# Patient Record
Sex: Male | Born: 1955
Health system: Southern US, Community
[De-identification: ages and names within clinical notes are randomized; demographics above are authoritative.]

## PROBLEM LIST (undated history)

## (undated) DIAGNOSIS — D649 Anemia, unspecified: Secondary | ICD-10-CM

## (undated) DIAGNOSIS — N183 Chronic kidney disease, stage 3 unspecified: Secondary | ICD-10-CM

## (undated) DIAGNOSIS — Z8701 Personal history of pneumonia (recurrent): Secondary | ICD-10-CM

## (undated) DIAGNOSIS — E785 Hyperlipidemia, unspecified: Secondary | ICD-10-CM

## (undated) DIAGNOSIS — I10 Essential (primary) hypertension: Secondary | ICD-10-CM

## (undated) DIAGNOSIS — M869 Osteomyelitis, unspecified: Secondary | ICD-10-CM

## (undated) DIAGNOSIS — E119 Type 2 diabetes mellitus without complications: Secondary | ICD-10-CM

## (undated) DIAGNOSIS — I5030 Unspecified diastolic (congestive) heart failure: Secondary | ICD-10-CM

## (undated) DIAGNOSIS — F419 Anxiety disorder, unspecified: Secondary | ICD-10-CM

---

## 2015-12-14 ENCOUNTER — Emergency Department (INDEPENDENT_AMBULATORY_CARE_PROVIDER_SITE_OTHER)
Admission: EM | Admit: 2015-12-14 | Discharge: 2015-12-14 | Disposition: A | Discharge status: Home / Self Care | Payer: BLUE CROSS/BLUE SHIELD | Source: Home / Self Care | Attending: Emergency Medicine | Admitting: Emergency Medicine

## 2015-12-14 ENCOUNTER — Emergency Department (INDEPENDENT_AMBULATORY_CARE_PROVIDER_SITE_OTHER): Payer: BLUE CROSS/BLUE SHIELD

## 2015-12-14 ENCOUNTER — Encounter (HOSPITAL_COMMUNITY): Payer: Self-pay | Admitting: Emergency Medicine

## 2015-12-14 DIAGNOSIS — J189 Pneumonia, unspecified organism: Secondary | ICD-10-CM | POA: Diagnosis not present

## 2015-12-14 MED ORDER — LIDOCAINE HCL (PF) 1 % IJ SOLN
INTRAMUSCULAR | Status: AC
  Filled 2015-12-14: qty 5

## 2015-12-14 MED ORDER — CEFTRIAXONE SODIUM 1 G IJ SOLR
1.0000 g | Freq: Once | INTRAMUSCULAR | Status: AC
  Administered 2015-12-14: 1 g via INTRAMUSCULAR

## 2015-12-14 MED ORDER — LEVOFLOXACIN 750 MG PO TABS: 750.0000 mg | ORAL_TABLET | Freq: Every day | ORAL | Status: DC

## 2015-12-14 MED ORDER — CEFTRIAXONE SODIUM 1 G IJ SOLR
INTRAMUSCULAR | Status: AC
  Filled 2015-12-14: qty 10

## 2015-12-14 NOTE — ED Notes (Signed)
C/o cold sx onset x3 days associated w/wheezing, chest congestion and feeling tired Denies CP, fevers Taking OTC cold meds w/no relief.

## 2015-12-14 NOTE — ED Provider Notes (Signed)
CSN: [id number]     Arrival date & time 12/14/15  1321 History   First MD Initiated Contact with Patient 12/14/15 1447     Chief Complaint  Patient presents with  . URI   (Consider location/radiation/quality/duration/timing/severity/associated sxs/prior Treatment) HPI Patient presents with chief complaint of shortness of breath. He states that he was at a company picnic's Friday and carrying boxes of potato chips when he suddenly became short of breath. He states that it is not related to chest pain but does get worse with exertion. He also states that when he tried to sleep Friday night each time he would lie down he would get short of breath and would have to sit up immediately felt somewhat better Saturday so did not feel he needed to be seen emergently but last night his symptoms came back. Patient is a nonsmoker states that he does have a bit of a cough of clear sputum. History of cardiac disease. 7 history of hypertension that he has taken hydrochlorothiazide for any is a type II diabetic which he takes Amaryl. History reviewed. No pertinent past medical history. History reviewed. No pertinent past surgical history. No family history on file. Social History  Substance Use Topics  . Smoking status: Never Smoker   . Smokeless tobacco: None  . Alcohol Use: No    Review of Systems  Allergies  Review of patient's allergies indicates no known allergies.  Home Medications   Prior to Admission medications   Medication Sig Start Date End Date Taking? Authorizing Provider  benazepril (LOTENSIN) 20 MG tablet Take 20 mg by mouth daily.   Yes Historical Provider, MD  citalopram (CELEXA) 20 MG tablet Take 20 mg by mouth daily.   Yes Historical Provider, MD  glimepiride (AMARYL) 4 MG tablet Take 4 mg by mouth daily with breakfast.   Yes Historical Provider, MD  hydrochlorothiazide (HYDRODIURIL) 25 MG tablet Take 25 mg by mouth daily.   Yes Historical Provider, MD  Linagliptin-Metformin HCl  (JENTADUETO PO) Take by mouth.   Yes Historical Provider, MD   Meds Ordered and Administered this Visit   Medications  cefTRIAXone (ROCEPHIN) injection 1 g (1 g Intramuscular Given 12/14/15 1611)    BP 124/86 mmHg  Pulse 113  Temp(Src) 98.6 F (37 C) (Oral)  SpO2 87% No data found.   Physical Exam  Constitutional: He is oriented to person, place, and time. He appears well-developed and well-nourished.  HENT:  Head: Normocephalic and atraumatic.  Right Ear: External ear normal.  Left Ear: External ear normal.  Mouth/Throat: Oropharynx is clear and moist.  Neck: Normal range of motion. Neck supple.  Cardiovascular: Normal rate, regular rhythm and normal heart sounds.   Pulmonary/Chest: Effort normal. No respiratory distress. He has no wheezes. He exhibits no tenderness.  Oxygen saturations have ranged from 79% to 87%. Patient is not dyspneic on examination. Breath sounds are decreased bilaterally with crackles on the left.  Abdominal: Soft. Bowel sounds are normal.  Musculoskeletal: Normal range of motion.  Neurological: He is alert and oriented to person, place, and time.  Skin: Skin is warm and dry.  Psychiatric: He has a normal mood and affect. His behavior is normal. Judgment and thought content normal.  Nursing note and vitals reviewed.  ROS +'veSHORTNESS OF BREATH   Denies: HEADACHE, NAUSEA, ABDOMINAL PAIN, CHEST PAIN, CONGESTION, DYSURIA ED Course  Procedures (including critical care time)  Labs Review Labs Reviewed - No data to display  Imaging Review No results found.   Visual  Acuity Review  Right Eye Distance:   Left Eye Distance:   Bilateral Distance:    Right Eye Near:   Left Eye Near:    Bilateral Near:       Review of chest x-ray with patient and his wife positive for bilateral infiltrates left greater than right. Patient is advised that he will be receiving 1 g of IV ceftriaxone and a prescription for levofloxacin 750 mg. He is also advised  that if his symptoms worsen over the course of the night he should go to [facility]. He is also advised that if he is feeling better or as stable as he is now to give a call to the urgent care center tomorrow to speak to me. A return to work note is provided for this patient. Patient's saturation is somewhat concerning but he feels well. There is a reasonable explanation for the saturations being low. Discussion with patient about going to Hospital patient states that he would prefer to try outpatient treatment first and he will elect to go to the emergency department if he needs to be  MDM   1. Community acquired pneumonia    Patient is advised to continue home symptomatic treatment. Prescription for monotherapy with Levaquin  sent pharmacy patient has indicated. Patient is advised that if there are new or worsening symptoms or attend the emergency department, or contact primary care provider. Instructions of care provided discharged home in stable condition.  THIS NOTE WAS GENERATED USING A VOICE RECOGNITION SOFTWARE PROGRAM. ALL REASONABLE EFFORTS  WERE MADE TO PROOFREAD THIS DOCUMENT FOR ACCURACY.       Tharon Aquas, PA 12/14/15 [phone]

## 2015-12-19 ENCOUNTER — Emergency Department (HOSPITAL_COMMUNITY): Payer: BLUE CROSS/BLUE SHIELD

## 2015-12-19 ENCOUNTER — Inpatient Hospital Stay (HOSPITAL_COMMUNITY)
Admission: EM | Admit: 2015-12-19 | Discharge: 2015-12-26 | DRG: 981 | Disposition: A | Discharge status: Home Health | Payer: BLUE CROSS/BLUE SHIELD | Attending: Internal Medicine | Admitting: Internal Medicine

## 2015-12-19 ENCOUNTER — Encounter (HOSPITAL_COMMUNITY): Payer: Self-pay | Admitting: Emergency Medicine

## 2015-12-19 DIAGNOSIS — Z7982 Long term (current) use of aspirin: Secondary | ICD-10-CM | POA: Diagnosis not present

## 2015-12-19 DIAGNOSIS — E119 Type 2 diabetes mellitus without complications: Secondary | ICD-10-CM

## 2015-12-19 DIAGNOSIS — I10 Essential (primary) hypertension: Secondary | ICD-10-CM | POA: Diagnosis not present

## 2015-12-19 DIAGNOSIS — I5033 Acute on chronic diastolic (congestive) heart failure: Secondary | ICD-10-CM | POA: Diagnosis present

## 2015-12-19 DIAGNOSIS — Z833 Family history of diabetes mellitus: Secondary | ICD-10-CM

## 2015-12-19 DIAGNOSIS — J9801 Acute bronchospasm: Secondary | ICD-10-CM | POA: Diagnosis present

## 2015-12-19 DIAGNOSIS — J189 Pneumonia, unspecified organism: Secondary | ICD-10-CM

## 2015-12-19 DIAGNOSIS — Z7984 Long term (current) use of oral hypoglycemic drugs: Secondary | ICD-10-CM | POA: Diagnosis not present

## 2015-12-19 DIAGNOSIS — J9601 Acute respiratory failure with hypoxia: Secondary | ICD-10-CM | POA: Diagnosis present

## 2015-12-19 DIAGNOSIS — N179 Acute kidney failure, unspecified: Secondary | ICD-10-CM | POA: Diagnosis present

## 2015-12-19 DIAGNOSIS — Z8249 Family history of ischemic heart disease and other diseases of the circulatory system: Secondary | ICD-10-CM | POA: Diagnosis not present

## 2015-12-19 DIAGNOSIS — I5031 Acute diastolic (congestive) heart failure: Secondary | ICD-10-CM | POA: Diagnosis present

## 2015-12-19 DIAGNOSIS — M869 Osteomyelitis, unspecified: Secondary | ICD-10-CM | POA: Diagnosis present

## 2015-12-19 DIAGNOSIS — I110 Hypertensive heart disease with heart failure: Secondary | ICD-10-CM | POA: Diagnosis present

## 2015-12-19 DIAGNOSIS — F22 Delusional disorders: Secondary | ICD-10-CM

## 2015-12-19 DIAGNOSIS — L97519 Non-pressure chronic ulcer of other part of right foot with unspecified severity: Secondary | ICD-10-CM | POA: Diagnosis present

## 2015-12-19 DIAGNOSIS — E1169 Type 2 diabetes mellitus with other specified complication: Secondary | ICD-10-CM | POA: Diagnosis present

## 2015-12-19 DIAGNOSIS — Z23 Encounter for immunization: Secondary | ICD-10-CM

## 2015-12-19 DIAGNOSIS — R0600 Dyspnea, unspecified: Secondary | ICD-10-CM | POA: Diagnosis not present

## 2015-12-19 DIAGNOSIS — E1142 Type 2 diabetes mellitus with diabetic polyneuropathy: Secondary | ICD-10-CM | POA: Diagnosis present

## 2015-12-19 DIAGNOSIS — D649 Anemia, unspecified: Secondary | ICD-10-CM | POA: Diagnosis present

## 2015-12-19 DIAGNOSIS — Z9889 Other specified postprocedural states: Secondary | ICD-10-CM

## 2015-12-19 DIAGNOSIS — E11621 Type 2 diabetes mellitus with foot ulcer: Secondary | ICD-10-CM | POA: Diagnosis present

## 2015-12-19 DIAGNOSIS — R0602 Shortness of breath: Secondary | ICD-10-CM | POA: Diagnosis present

## 2015-12-19 HISTORY — DX: Osteomyelitis, unspecified: M86.9

## 2015-12-19 LAB — BLOOD GAS, ARTERIAL
Acid-Base Excess: 4.4 mmol/L — ABNORMAL HIGH (ref 0.0–2.0)
Bicarbonate: 21 mEq/L (ref 20.0–24.0)
Delivery systems: POSITIVE
Drawn by: 221791
Expiratory PAP: 8
FIO2: 100
Inspiratory PAP: 16
O2 Saturation: 92.1 %
Patient temperature: 37
RATE: 10 resp/min
TCO2: 12.4 mmol/L (ref 0–100)
pCO2 arterial: 31.7 mmHg — ABNORMAL LOW (ref 35.0–45.0)
pH, Arterial: 7.406 (ref 7.350–7.450)
pO2, Arterial: 70.8 mmHg — ABNORMAL LOW (ref 80.0–100.0)

## 2015-12-19 LAB — BASIC METABOLIC PANEL
Anion gap: 10 (ref 5–15)
BUN: 51 mg/dL — ABNORMAL HIGH (ref 6–20)
CO2: 26 mmol/L (ref 22–32)
Calcium: 8.7 mg/dL — ABNORMAL LOW (ref 8.9–10.3)
Chloride: 104 mmol/L (ref 101–111)
Creatinine, Ser: 2.04 mg/dL — ABNORMAL HIGH (ref 0.61–1.24)
GFR calc Af Amer: 40 mL/min — ABNORMAL LOW (ref >60)
GFR calc non Af Amer: 34 mL/min — ABNORMAL LOW (ref >60)
Glucose, Bld: 247 mg/dL — ABNORMAL HIGH (ref 65–99)
Potassium: 4.5 mmol/L (ref 3.5–5.1)
Sodium: 140 mmol/L (ref 135–145)

## 2015-12-19 LAB — CBC WITH DIFFERENTIAL/PLATELET
Basophils Absolute: 0 10*3/uL (ref 0.0–0.1)
Basophils Relative: 0 %
Eosinophils Absolute: 0.2 10*3/uL (ref 0.0–0.7)
Eosinophils Relative: 1 %
HCT: 32.7 % — ABNORMAL LOW (ref 39.0–52.0)
Hemoglobin: 10.5 g/dL — ABNORMAL LOW (ref 13.0–17.0)
Lymphocytes Relative: 12 %
Lymphs Abs: 1.8 10*3/uL (ref 0.7–4.0)
MCH: 28.5 pg (ref 26.0–34.0)
MCHC: 32.1 g/dL (ref 30.0–36.0)
MCV: 88.6 fL (ref 78.0–100.0)
Monocytes Absolute: 1.1 10*3/uL — ABNORMAL HIGH (ref 0.1–1.0)
Monocytes Relative: 7 %
Neutro Abs: 12.9 10*3/uL — ABNORMAL HIGH (ref 1.7–7.7)
Neutrophils Relative %: 80 %
Platelets: 427 10*3/uL — ABNORMAL HIGH (ref 150–400)
RBC: 3.69 MIL/uL — ABNORMAL LOW (ref 4.22–5.81)
RDW: 15.4 % (ref 11.5–15.5)
WBC: 16 10*3/uL — ABNORMAL HIGH (ref 4.0–10.5)

## 2015-12-19 LAB — INFLUENZA PANEL BY PCR (TYPE A & B)
H1N1 flu by pcr: NOT DETECTED
Influenza A By PCR: NEGATIVE
Influenza B By PCR: NEGATIVE

## 2015-12-19 LAB — GLUCOSE, CAPILLARY: Glucose-Capillary: 359 mg/dL — ABNORMAL HIGH (ref 65–99)

## 2015-12-19 LAB — LACTIC ACID, PLASMA
Lactic Acid, Venous: 1.9 mmol/L (ref 0.5–2.0)
Lactic Acid, Venous: 2.1 mmol/L (ref 0.5–2.0)

## 2015-12-19 LAB — BRAIN NATRIURETIC PEPTIDE: B Natriuretic Peptide: 4067 pg/mL — ABNORMAL HIGH (ref 0.0–100.0)

## 2015-12-19 MED ORDER — LINAGLIPTIN 5 MG PO TABS: 5.0000 mg | ORAL_TABLET | Freq: Every day | ORAL | Status: DC

## 2015-12-19 MED ORDER — DEXTROSE 5 % IV SOLN
500.0000 mg | INTRAVENOUS | Status: DC
  Administered 2015-12-20 – 2015-12-24 (×5): 500 mg via INTRAVENOUS
  Filled 2015-12-19 (×5): qty 500

## 2015-12-19 MED ORDER — AMLODIPINE BESYLATE 5 MG PO TABS
5.0000 mg | ORAL_TABLET | Freq: Two times a day (BID) | ORAL | Status: DC
  Administered 2015-12-19 – 2015-12-20 (×2): 5 mg via ORAL
  Filled 2015-12-19 (×2): qty 1

## 2015-12-19 MED ORDER — INSULIN ASPART 100 UNIT/ML ~~LOC~~ SOLN
0.0000 [IU] | Freq: Every day | SUBCUTANEOUS | Status: DC
  Administered 2015-12-19 – 2015-12-21 (×2): 5 [IU] via SUBCUTANEOUS
  Administered 2015-12-22: 3 [IU] via SUBCUTANEOUS
  Administered 2015-12-24: 2 [IU] via SUBCUTANEOUS

## 2015-12-19 MED ORDER — DEXTROSE 5 % IV SOLN
500.0000 mg | Freq: Once | INTRAVENOUS | Status: AC
  Administered 2015-12-19: 500 mg via INTRAVENOUS
  Filled 2015-12-19: qty 500

## 2015-12-19 MED ORDER — DEXTROSE 5 % IV SOLN
1.0000 g | INTRAVENOUS | Status: DC
  Filled 2015-12-19 (×2): qty 10

## 2015-12-19 MED ORDER — METHYLPREDNISOLONE SODIUM SUCC 125 MG IJ SOLR
125.0000 mg | Freq: Once | INTRAMUSCULAR | Status: AC
  Administered 2015-12-19: 125 mg via INTRAVENOUS
  Filled 2015-12-19: qty 2

## 2015-12-19 MED ORDER — ASPIRIN EC 81 MG PO TBEC
81.0000 mg | DELAYED_RELEASE_TABLET | Freq: Every day | ORAL | Status: DC
  Administered 2015-12-20 – 2015-12-26 (×6): 81 mg via ORAL
  Filled 2015-12-19 (×6): qty 1

## 2015-12-19 MED ORDER — CITALOPRAM HYDROBROMIDE 20 MG PO TABS
20.0000 mg | ORAL_TABLET | Freq: Every day | ORAL | Status: DC
  Administered 2015-12-20 – 2015-12-26 (×6): 20 mg via ORAL
  Filled 2015-12-19 (×6): qty 1

## 2015-12-19 MED ORDER — ASPIRIN EC 81 MG PO TBEC: 81.0000 mg | DELAYED_RELEASE_TABLET | Freq: Every day | ORAL | Status: DC

## 2015-12-19 MED ORDER — INFLUENZA VAC SPLIT QUAD 0.5 ML IM SUSY
0.5000 mL | PREFILLED_SYRINGE | INTRAMUSCULAR | Status: AC
  Administered 2015-12-20: 0.5 mL via INTRAMUSCULAR
  Filled 2015-12-19: qty 0.5

## 2015-12-19 MED ORDER — AMLODIPINE BESYLATE 5 MG PO TABS: 10.0000 mg | ORAL_TABLET | Freq: Every day | ORAL | Status: DC

## 2015-12-19 MED ORDER — VANCOMYCIN HCL 10 G IV SOLR
1750.0000 mg | Freq: Once | INTRAVENOUS | Status: DC
  Filled 2015-12-19: qty 1750

## 2015-12-19 MED ORDER — PNEUMOCOCCAL VAC POLYVALENT 25 MCG/0.5ML IJ INJ
0.5000 mL | INJECTION | INTRAMUSCULAR | Status: AC
  Administered 2015-12-20: 0.5 mL via INTRAMUSCULAR
  Filled 2015-12-19: qty 0.5

## 2015-12-19 MED ORDER — DEXTROSE 5 % IV SOLN
500.0000 mg | INTRAVENOUS | Status: DC
  Filled 2015-12-19 (×2): qty 500

## 2015-12-19 MED ORDER — ALBUTEROL (5 MG/ML) CONTINUOUS INHALATION SOLN
10.0000 mg/h | INHALATION_SOLUTION | Freq: Once | RESPIRATORY_TRACT | Status: AC
  Administered 2015-12-19: 10 mg/h via RESPIRATORY_TRACT
  Filled 2015-12-19: qty 20

## 2015-12-19 MED ORDER — LINAGLIPTIN 5 MG PO TABS
5.0000 mg | ORAL_TABLET | Freq: Every day | ORAL | Status: DC
  Administered 2015-12-20: 5 mg via ORAL
  Filled 2015-12-19: qty 1

## 2015-12-19 MED ORDER — IPRATROPIUM BROMIDE 0.02 % IN SOLN
1.0000 mg | Freq: Once | RESPIRATORY_TRACT | Status: AC
  Administered 2015-12-19: 1 mg via RESPIRATORY_TRACT
  Filled 2015-12-19: qty 5

## 2015-12-19 MED ORDER — DEXTROSE 5 % IV SOLN
1.0000 g | Freq: Once | INTRAVENOUS | Status: AC
  Administered 2015-12-19: 1 g via INTRAVENOUS
  Filled 2015-12-19: qty 10

## 2015-12-19 MED ORDER — VANCOMYCIN HCL IN DEXTROSE 750-5 MG/150ML-% IV SOLN
750.0000 mg | Freq: Two times a day (BID) | INTRAVENOUS | Status: DC
  Filled 2015-12-19 (×5): qty 150

## 2015-12-19 MED ORDER — METHYLPREDNISOLONE SODIUM SUCC 125 MG IJ SOLR
60.0000 mg | Freq: Two times a day (BID) | INTRAMUSCULAR | Status: DC
  Administered 2015-12-20 – 2015-12-21 (×3): 60 mg via INTRAVENOUS
  Filled 2015-12-19 (×3): qty 2

## 2015-12-19 MED ORDER — SODIUM CHLORIDE 0.9 % IV BOLUS (SEPSIS)
500.0000 mL | Freq: Once | INTRAVENOUS | Status: AC
Start: 2015-12-19 — End: 2015-12-19
  Administered 2015-12-19: 500 mL via INTRAVENOUS

## 2015-12-19 MED ORDER — GLIMEPIRIDE 4 MG PO TABS
4.0000 mg | ORAL_TABLET | Freq: Every day | ORAL | Status: DC
  Administered 2015-12-20: 4 mg via ORAL
  Filled 2015-12-19: qty 1
  Filled 2015-12-19: qty 2
  Filled 2015-12-19 (×2): qty 1

## 2015-12-19 MED ORDER — ENOXAPARIN SODIUM 40 MG/0.4ML ~~LOC~~ SOLN
40.0000 mg | SUBCUTANEOUS | Status: DC
  Administered 2015-12-19 – 2015-12-25 (×6): 40 mg via SUBCUTANEOUS
  Filled 2015-12-19 (×6): qty 0.4

## 2015-12-19 MED ORDER — BENAZEPRIL HCL 10 MG PO TABS
20.0000 mg | ORAL_TABLET | Freq: Two times a day (BID) | ORAL | Status: DC
  Administered 2015-12-19: 20 mg via ORAL
  Filled 2015-12-19: qty 2

## 2015-12-19 MED ORDER — HYDROCHLOROTHIAZIDE 25 MG PO TABS: 25.0000 mg | ORAL_TABLET | Freq: Every day | ORAL | Status: DC

## 2015-12-19 MED ORDER — DEXTROSE 5 % IV SOLN
1.0000 g | Freq: Once | INTRAVENOUS | Status: DC
  Administered 2015-12-19: 1 g via INTRAVENOUS
  Filled 2015-12-19: qty 1

## 2015-12-19 MED ORDER — INSULIN ASPART 100 UNIT/ML ~~LOC~~ SOLN
0.0000 [IU] | Freq: Three times a day (TID) | SUBCUTANEOUS | Status: DC
  Administered 2015-12-20: 15 [IU] via SUBCUTANEOUS
  Administered 2015-12-20: 20 [IU] via SUBCUTANEOUS
  Administered 2015-12-20: 3 [IU] via SUBCUTANEOUS
  Administered 2015-12-21 (×2): 7 [IU] via SUBCUTANEOUS
  Administered 2015-12-21: 3 [IU] via SUBCUTANEOUS
  Administered 2015-12-22: 4 [IU] via SUBCUTANEOUS
  Administered 2015-12-22: 3 [IU] via SUBCUTANEOUS
  Administered 2015-12-22 – 2015-12-23 (×2): 7 [IU] via SUBCUTANEOUS
  Administered 2015-12-23 – 2015-12-24 (×3): 3 [IU] via SUBCUTANEOUS
  Administered 2015-12-25: 11 [IU] via SUBCUTANEOUS
  Administered 2015-12-25 (×2): 3 [IU] via SUBCUTANEOUS
  Administered 2015-12-26: 11 [IU] via SUBCUTANEOUS
  Administered 2015-12-26: 3 [IU] via SUBCUTANEOUS
  Administered 2015-12-26: 4 [IU] via SUBCUTANEOUS

## 2015-12-19 MED ORDER — SODIUM CHLORIDE 0.9 % IV SOLN
INTRAVENOUS | Status: DC
  Administered 2015-12-19: 100 mL/h via INTRAVENOUS

## 2015-12-19 MED ORDER — METFORMIN HCL 500 MG PO TABS
1000.0000 mg | ORAL_TABLET | Freq: Two times a day (BID) | ORAL | Status: DC
  Administered 2015-12-20: 1000 mg via ORAL
  Filled 2015-12-19: qty 2

## 2015-12-19 MED ORDER — DEXTROSE 5 % IV SOLN
1.0000 g | INTRAVENOUS | Status: AC
  Administered 2015-12-20 – 2015-12-25 (×6): 1 g via INTRAVENOUS
  Filled 2015-12-19 (×6): qty 10

## 2015-12-19 NOTE — ED Provider Notes (Signed)
Called into exam room by ED RN: Pt in process of being transported to his inpt room when he became increasingly SOB and diaphoretic. Sats 70% R/A. Pt taking very small shallow breaths. NRB applied. Pt awake/alert, tachypneic, lungs coarse with wheezes bilat, no audible wheezing, HR tachycardic, abd soft/NT, +2 pedal edema bilat. RT called to start bipap and hour long neb. BNP and influenza panel ordered. [facility] Dr. Adrian Blackwater at bedside.    Samuel Jester, DO 12/19/15 1710

## 2015-12-19 NOTE — ED Notes (Signed)
Pt is currently on 4L of oxygen via [place].  States that he is feeling better after oxygen administration.

## 2015-12-19 NOTE — ED Notes (Signed)
Respiratory paged for bipap and Dr. Adrian Blackwater at bedside to evaluate.

## 2015-12-19 NOTE — ED Notes (Signed)
Pt placed on bipap by RT.  Dr Deretha Emory at bedside with myself.

## 2015-12-19 NOTE — ED Provider Notes (Addendum)
CSN: [id number]     Arrival date & time 12/19/15  1159 History   First MD Initiated Contact with Patient 12/19/15 1307     Chief Complaint  Patient presents with  . Fatigue     (Consider location/radiation/quality/duration/timing/severity/associated sxs/prior Treatment) The history is provided by the patient and the spouse.   60 year old male seen in [facility] urgent care 1 week ago given a shot of Rocephin and started on Levaquin for community-acquired pneumonia based on chest x-ray. Patient has not really improved his eczema gotten worse. Patient feeling very short of breath.  Past Medical History  Diagnosis Date  . Diabetes mellitus without complication (HCC)    History reviewed. No pertinent past surgical history. History reviewed. No pertinent family history. Social History  Substance Use Topics  . Smoking status: Never Smoker   . Smokeless tobacco: None  . Alcohol Use: No    Review of Systems  Constitutional: Positive for fatigue.  HENT: Positive for congestion.   Eyes: Negative for visual disturbance.  Respiratory: Positive for cough and shortness of breath.   Cardiovascular: Negative for chest pain.  Gastrointestinal: Negative for nausea, vomiting and abdominal pain.  Musculoskeletal: Negative for back pain.  Neurological: Negative for headaches.  Hematological: Does not bruise/bleed easily.  Psychiatric/Behavioral: Negative for confusion.      Allergies  Review of patient's allergies indicates no known allergies.  Home Medications   Prior to Admission medications   Medication Sig Start Date End Date Taking? Authorizing Provider  amLODipine-benazepril (LOTREL) 5-20 MG capsule Take 1 capsule by mouth 2 (two) times daily.  11/21/15  Yes Historical Provider, MD  aspirin EC 81 MG tablet Take 81 mg by mouth daily.   Yes Historical Provider, MD  citalopram (CELEXA) 20 MG tablet Take 20 mg by mouth daily.   Yes Historical Provider, MD  Cyanocobalamin (B-12 PO) Take 1  tablet by mouth daily.   Yes Historical Provider, MD  glimepiride (AMARYL) 4 MG tablet Take 4 mg by mouth daily with breakfast.   Yes Historical Provider, MD  hydrochlorothiazide (HYDRODIURIL) 25 MG tablet Take 25 mg by mouth daily.   Yes Historical Provider, MD  levofloxacin (LEVAQUIN) 750 MG tablet Take 1 tablet (750 mg total) by mouth daily. 12/14/15  Yes Tharon Aquas, PA  Linagliptin-Metformin HCl (JENTADUETO) 2.03-999 MG TABS Take 1 tablet by mouth 2 (two) times daily.   Yes Historical Provider, MD  Multiple Vitamin (MULTIVITAMIN WITH MINERALS) TABS tablet Take 1 tablet by mouth daily.   Yes Historical Provider, MD   BP 134/93 mmHg  Pulse 100  Temp(Src) 97.5 F (36.4 C) (Oral)  Resp 29  Ht 5\' 10"  (1.778 m)  Wt 81.647 kg  BMI 25.83 kg/m2  SpO2 90% Physical Exam  Constitutional: He is oriented to person, place, and time. He appears well-developed and well-nourished.  HENT:  Head: Normocephalic and atraumatic.  Mouth/Throat: Oropharynx is clear and moist.  Eyes: Conjunctivae and EOM are normal. Pupils are equal, round, and reactive to light.  Neck: Normal range of motion. Neck supple.  Cardiovascular: Normal rate and normal heart sounds.   Pulmonary/Chest: No respiratory distress. He has rales.  Abdominal: Soft. Bowel sounds are normal. There is no tenderness.  Musculoskeletal: Normal range of motion. He exhibits edema.  Neurological: He is alert and oriented to person, place, and time. No cranial nerve deficit. He exhibits normal muscle tone. Coordination normal.  Skin: Skin is warm. No erythema.  Nursing note and vitals reviewed.   ED Course  Procedures (including critical care time) Labs Review Labs Reviewed  CBC WITH DIFFERENTIAL/PLATELET - Abnormal; Notable for the following:    WBC 16.0 (*)    RBC 3.69 (*)    Hemoglobin 10.5 (*)    HCT 32.7 (*)    Platelets 427 (*)    Neutro Abs 12.9 (*)    Monocytes Absolute 1.1 (*)    All other components within normal limits   BASIC METABOLIC PANEL - Abnormal; Notable for the following:    Glucose, Bld 247 (*)    BUN 51 (*)    Creatinine, Ser 2.04 (*)    Calcium 8.7 (*)    GFR calc non Af Amer 34 (*)    GFR calc Af Amer 40 (*)    All other components within normal limits  CULTURE, BLOOD (ROUTINE X 2)  CULTURE, BLOOD (ROUTINE X 2)  LACTIC ACID, PLASMA  LACTIC ACID, PLASMA   Results for orders placed or performed during the hospital encounter of 12/19/15  Culture, blood (routine x 2)  Result Value Ref Range   Specimen Description BLOOD RIGHT ARM    Special Requests BOTTLES DRAWN AEROBIC AND ANAEROBIC 8 CC EACH    Culture PENDING    Report Status PENDING   Culture, blood (routine x 2)  Result Value Ref Range   Specimen Description BLOOD LEFT ARM    Special Requests BOTTLES DRAWN AEROBIC AND ANAEROBIC 9CC EACH    Culture PENDING    Report Status PENDING   CBC WITH DIFFERENTIAL  Result Value Ref Range   WBC 16.0 (H) 4.0 - 10.5 K/uL   RBC 3.69 (L) 4.22 - 5.81 MIL/uL   Hemoglobin 10.5 (L) 13.0 - 17.0 g/dL   HCT 16.1 (L) 09.6 - 04.5 %   MCV 88.6 78.0 - 100.0 fL   MCH 28.5 26.0 - 34.0 pg   MCHC 32.1 30.0 - 36.0 g/dL   RDW 40.9 81.1 - 91.4 %   Platelets 427 (H) 150 - 400 K/uL   Neutrophils Relative % 80 %   Neutro Abs 12.9 (H) 1.7 - 7.7 K/uL   Lymphocytes Relative 12 %   Lymphs Abs 1.8 0.7 - 4.0 K/uL   Monocytes Relative 7 %   Monocytes Absolute 1.1 (H) 0.1 - 1.0 K/uL   Eosinophils Relative 1 %   Eosinophils Absolute 0.2 0.0 - 0.7 K/uL   Basophils Relative 0 %   Basophils Absolute 0.0 0.0 - 0.1 K/uL  Basic metabolic panel  Result Value Ref Range   Sodium 140 135 - 145 mmol/L   Potassium 4.5 3.5 - 5.1 mmol/L   Chloride 104 101 - 111 mmol/L   CO2 26 22 - 32 mmol/L   Glucose, Bld 247 (H) 65 - 99 mg/dL   BUN 51 (H) 6 - 20 mg/dL   Creatinine, Ser 7.82 (H) 0.61 - 1.24 mg/dL   Calcium 8.7 (L) 8.9 - 10.3 mg/dL   GFR calc non Af Amer 34 (L) >60 mL/min   GFR calc Af Amer 40 (L) >60 mL/min   Anion  gap 10 5 - 15  Lactic acid, plasma  Result Value Ref Range   Lactic Acid, Venous 1.9 0.5 - 2.0 mmol/L     Imaging Review Dg Chest 2 View  12/19/2015  CLINICAL DATA:  Cough and congestion for 1 week EXAM: CHEST  2 VIEW COMPARISON:  12/14/2015 FINDINGS: Cardiac shadow is stable. Increasing perihilar infiltrates are noted bilaterally worse on the left than the right. Right lower lobe infiltrate  is noted as well. Bony structures are stable. IMPRESSION: Increasing bilateral infiltrates left greater than right. Electronically Signed   By: Alcide Clever M.D.   On: 12/19/2015 14:25   I have personally reviewed and evaluated these images and lab results as part of my medical decision-making.   EKG Interpretation None      MDM   Final diagnoses:  Community acquired pneumonia   Patient with worsening community-acquired pneumonia outpatient failure on Levaquin. Patient will require admission does have a oxygen requirement. Normally not on oxygen. Past medical history significant for diabetes. Lactic acid was normal no evidence of sepsis at this time. Patient will be restarted on Rocephin and Zithromax IV as per discussion with admitting hospitalist. If patient doesn't quickly improve on that they will brought the antibiotics out. Patient was given a shot of Rocephin a week ago and started on 750 mg of Levaquin for the past week without any real improvement in the pneumonia. Patient arrived here with oxygen saturation is anywhere from 8289% on room air. On oxygen patient is satting in the low 90s.  Patient stable here on the oxygen mentating fine.     Vanetta Mulders, MD 12/19/15 1526    Addendum:  CRITICAL CARE Performed by: Vanetta Mulders Total critical care time: 30 minutes Critical care time was exclusive of separately billable procedures and treating other patients. Critical care was necessary to treat or prevent imminent or life-threatening deterioration. Critical care was time  spent personally by me on the following activities: development of treatment plan with patient and/or surrogate as well as nursing, discussions with consultants, evaluation of patient's response to treatment, examination of patient, obtaining history from patient or surrogate, ordering and performing treatments and interventions, ordering and review of laboratory studies, ordering and review of radiographic studies, pulse oximetry and re-evaluation of patient's condition.  Chest this patient of was getting ready to go upstairs for their admission patient had sudden respiratory distress. Significant drop in oxygen saturations. Started on 100% nonrebreather. Respiratory therapy called patient started on BiPAP. Patient responded very well to the BiPAP also got a breathing treatment. Feel that patient probably had an acute bronchospastic episode and also was tiring from the pneumonia. Patient will now require admission to stepdown. Blood gas done after being on the BiPAP. PH 7.4 PCO2 31, PO2 70. Patient doing much better.   Vanetta Mulders, MD 12/19/15 [phone]

## 2015-12-19 NOTE — ED Notes (Signed)
Room being cleaned at this time. Pt will be taken to room once room is ready. nad noted.

## 2015-12-19 NOTE — H&P (Addendum)
History and Physical  Douglas Cannon ZOX:[id number] DOB: [birth year] DOA: 12/19/2015  Referring physician: Dr Deretha Emory, ED physician PCP: No PCP Per Patient   Chief Complaint: Shortness of breath  HPI: Douglas Cannon is a 60 y.o. male  History of diabetes and hypertension who presents to the [facility] ER due to 1 week of worsening cough and shortness of breath. He was seen at the urgent care on 1/29 and was prescribed Levaquin due to bilateral infiltrates with a diagnosis of community-acquired pneumonia. He was started on Levaquin. Over the next 5 days, the patient continued to have worsening shortness of breath typically worsened with ambulation and movement. His symptoms are improved with rest. Patient has been coughing which initially was productive, but now nonproductive.   Review of Systems:   Pt complains of decreased appetite, shortness of breath, wheezing.  Pt denies any fevers, chills, nausea, vomiting, diarrhea, constipation, abdominal pain, orthopnea, cough, palpitations, headache, vision changes, lightheadedness, dizziness, diarrhea, constipation, melena, rectal bleeding.  Review of systems are otherwise negative  Past Medical History  Diagnosis Date  . Diabetes mellitus without complication (HCC)   . Hypertension    History reviewed. No pertinent past surgical history. Social History:  reports that he has never smoked. He does not have any smokeless tobacco history on file. He reports that he does not drink alcohol or use illicit drugs. Patient lives at home & is able to participate in activities of daily living  No Known Allergies  Family history diabetes and hypertension  Prior to Admission medications   Medication Sig Start Date End Date Taking? Authorizing Provider  amLODipine-benazepril (LOTREL) 5-20 MG capsule Take 1 capsule by mouth 2 (two) times daily.  11/21/15  Yes Historical Provider, MD  aspirin EC 81 MG tablet Take 81 mg by mouth daily.   Yes Historical  Provider, MD  citalopram (CELEXA) 20 MG tablet Take 20 mg by mouth daily.   Yes Historical Provider, MD  Cyanocobalamin (B-12 PO) Take 1 tablet by mouth daily.   Yes Historical Provider, MD  glimepiride (AMARYL) 4 MG tablet Take 4 mg by mouth daily with breakfast.   Yes Historical Provider, MD  hydrochlorothiazide (HYDRODIURIL) 25 MG tablet Take 25 mg by mouth daily.   Yes Historical Provider, MD  levofloxacin (LEVAQUIN) 750 MG tablet Take 1 tablet (750 mg total) by mouth daily. 12/14/15  Yes Tharon Aquas, PA  Linagliptin-Metformin HCl (JENTADUETO) 2.03-999 MG TABS Take 1 tablet by mouth 2 (two) times daily.   Yes Historical Provider, MD  Multiple Vitamin (MULTIVITAMIN WITH MINERALS) TABS tablet Take 1 tablet by mouth daily.   Yes Historical Provider, MD    Physical Exam: BP 164/108 mmHg  Pulse 115  Temp(Src) 97.5 F (36.4 C) (Oral)  Resp 31  Ht  (1.778 m)  Wt 81.647 kg (180 lb)  BMI 25.83 kg/m2  SpO2 92%  General: Middle-age occasion male. Awake and alert and oriented x3. No acute cardiopulmonary distress. Tachypnea And short of breath with conversation Eyes: Pupils equal, round, reactive to light. Extraocular muscles are intact. Sclerae anicteric and noninjected.  ENT: Dry mucosal membranes. No mucosal lesions. Teeth in moderate repair  Neck: Neck supple without lymphadenopathy. No carotid bruits. No masses palpated.  Cardiovascular: Regular rate with normal S1-S2 sounds. No murmurs, rubs, gallops auscultated. No JVD.  Respiratory: Diminished breath sounds. Rales in bases with diffuse wheezes throughout  Abdomen: Soft, nontender, nondistended. Active bowel sounds. No masses or hepatosplenomegaly  Skin: Dry, warm to touch. 2+  dorsalis pedis and radial pulses. Musculoskeletal: No calf or leg pain. All major joints not erythematous nontender.  Psychiatric: Intact judgment and insight.  Neurologic: No focal neurological deficits. Cranial nerves II through XII are grossly  intact.           Labs on Admission:  Basic Metabolic Panel:  Recent Labs Lab 12/19/15 1311  NA 140  K 4.5  CL 104  CO2 26  GLUCOSE 247*  BUN 51*  CREATININE 2.04*  CALCIUM 8.7*   Liver Function Tests: No results for input(s): AST, ALT, ALKPHOS, BILITOT, PROT, ALBUMIN in the last 168 hours. No results for input(s): LIPASE, AMYLASE in the last 168 hours. No results for input(s): AMMONIA in the last 168 hours. CBC:  Recent Labs Lab 12/19/15 1311  WBC 16.0*  NEUTROABS 12.9*  HGB 10.5*  HCT 32.7*  MCV 88.6  PLT 427*   Cardiac Enzymes: No results for input(s): CKTOTAL, CKMB, CKMBINDEX, TROPONINI in the last 168 hours.  BNP (last 3 results) No results for input(s): BNP in the last 8760 hours.  ProBNP (last 3 results) No results for input(s): PROBNP in the last 8760 hours.  CBG: No results for input(s): GLUCAP in the last 168 hours.  Radiological Exams on Admission: Dg Chest 2 View  12/19/2015  CLINICAL DATA:  Cough and congestion for 1 week EXAM: CHEST  2 VIEW COMPARISON:  12/14/2015 FINDINGS: Cardiac shadow is stable. Increasing perihilar infiltrates are noted bilaterally worse on the left than the right. Right lower lobe infiltrate is noted as well. Bony structures are stable. IMPRESSION: Increasing bilateral infiltrates left greater than right. Electronically Signed   By: Alcide Clever M.D.   On: 12/19/2015 14:25    Assessment/Plan Present on Admission:  . CAP (community acquired pneumonia) . Hypertension . Acute renal injury (HCC) . Bronchospasm  This patient was discussed with the ED physician, including pertinent vitals, physical exam findings, labs, and imaging.  We also discussed care given by the ED provider.  #1 community acquired pneumonia  Admit to MedSurg  Continue Rocephin and azithromycin  Strep antigen and Legionella antigen by urine  Repeat CBC in the morning  Incentive spirometry #2 acute renal injury  Start IV fluids  Hold ACE  inhibitor and metformin  Recheck creatinine in the morning #3 bronchospasm   Solu-Medrol 125 mg now, followed by 60 mg twice a day #4 diabetes  We'll continue patient on his other hypoglycemics  Sliding-scale insulin  CBGs before meals and daily at bedtime #5 hypertension  Will increase amlodipine due to holding ACE inhibitor  DVT prophylaxis: Lovenox  Consultants: None  Code Status: Full code  Family Communication: None   Disposition Plan: Admission - anticipate approximately 3 day stay   Levie Heritage, DO [facility] Pager [phone]   Addendum: After seeing the patient, his breathing became worse - he became tachypnic, O2 sats dropped to 70s.  Patient placed on BiPap with good response.  Will admit to stepdown. Additional Dx: Acute respiratory failure

## 2015-12-19 NOTE — Progress Notes (Signed)
Placed water bottle on cannula 6 lpm . Patient desaturated in to 80"s , back on 6 lpm his saturation only about 85- 88, placed on NRB mask patient saturation came up to 91 slowly. Patient is on NRB for now will decrease oxygen as tolerated. Most likely will have to wear BiPAP tonight. Patient does not show symptoms of low saturations.

## 2015-12-19 NOTE — Progress Notes (Signed)
Pharmacy Antibiotic Note  Douglas Cannon is a 60 y.o. male admitted on 12/19/2015 with pneumonia.  Pharmacy has been consulted for vancomycin dosing.  Plan: Vancomycin 1750 mg IV x 1 then 750 mg IV q12 hours. F/u renal function, cultures and clinical course   Height:  (177.8 cm) Weight: 180 lb (81.647 kg) IBW/kg (Calculated) : 73  Temp (24hrs), Avg:97.5 F (36.4 C), Min:97.5 F (36.4 C), Max:97.5 F (36.4 C)   Recent Labs Lab 12/19/15 1311  WBC 16.0*  CREATININE 2.04*  LATICACIDVEN 1.9    Estimated Creatinine Clearance: 40.8 mL/min (by C-G formula based on Cr of 2.04).    No Known Allergies  Antimicrobials this admission: Vancomycin 2/3 >>  Cefepime  2/3>>  Thank you for allowing pharmacy to be a part of this patient's care.  Talbert Cage P[place]et 12/19/2015 3:06 PM

## 2015-12-19 NOTE — ED Notes (Addendum)
Pt reports was diagnosed with pneumonia on Sunday and started on oral abx. Pt reports no relief. Pt o2 saturation in triage 86-89%. Pt reports fatigue,weakness. Pt denies any pain. Pt alert and oriented. nad noted.mild dyspnea noted with exertion.

## 2015-12-19 NOTE — Progress Notes (Signed)
Patient's breathing has improved. Transported on BIPAP. Patient taken off BIPAP and placed on 6 lpm nasal cannula after getting into ICU bed. O2 sats 96% at this time with no work of breathing noted. BIPAP in room on standby.

## 2015-12-19 NOTE — ED Notes (Signed)
Pt feeling much better and is resting well on the bipap.

## 2015-12-19 NOTE — ED Notes (Signed)
Went to move patient to inpatient floor and pt became very diaphoretic and short of breath.  Sats dropped to 70 %.  Pt placed on NRB and Dr. Adrian Blackwater paged.  Dr. Clarene Duke to bedside to evaluate.  Pt remains alert and anxious.

## 2015-12-19 NOTE — ED Notes (Signed)
Pt much less diaphoretic at this time and states that he feels better.

## 2015-12-19 NOTE — ED Notes (Signed)
Pt comfortable on bipap and resting.  Not disturbed for flu swab due to previous respiratory distress.

## 2015-12-20 ENCOUNTER — Inpatient Hospital Stay (HOSPITAL_COMMUNITY): Payer: BLUE CROSS/BLUE SHIELD

## 2015-12-20 DIAGNOSIS — R0600 Dyspnea, unspecified: Secondary | ICD-10-CM

## 2015-12-20 LAB — BASIC METABOLIC PANEL
Anion gap: 10 (ref 5–15)
BUN: 50 mg/dL — ABNORMAL HIGH (ref 6–20)
CO2: 23 mmol/L (ref 22–32)
Calcium: 8 mg/dL — ABNORMAL LOW (ref 8.9–10.3)
Chloride: 103 mmol/L (ref 101–111)
Creatinine, Ser: 2.06 mg/dL — ABNORMAL HIGH (ref 0.61–1.24)
GFR calc Af Amer: 39 mL/min — ABNORMAL LOW (ref >60)
GFR calc non Af Amer: 34 mL/min — ABNORMAL LOW (ref >60)
Glucose, Bld: 399 mg/dL — ABNORMAL HIGH (ref 65–99)
Potassium: 4.6 mmol/L (ref 3.5–5.1)
Sodium: 136 mmol/L (ref 135–145)

## 2015-12-20 LAB — RETICULOCYTES
RBC.: 3.53 MIL/uL — ABNORMAL LOW (ref 4.22–5.81)
Retic Count, Absolute: 208.3 10*3/uL — ABNORMAL HIGH (ref 19.0–186.0)
Retic Ct Pct: 5.9 % — ABNORMAL HIGH (ref 0.4–3.1)

## 2015-12-20 LAB — IRON AND TIBC
Iron: 45 ug/dL (ref 45–182)
Saturation Ratios: 17 % — ABNORMAL LOW (ref 17.9–39.5)
TIBC: 269 ug/dL (ref 250–450)
UIBC: 224 ug/dL

## 2015-12-20 LAB — GLUCOSE, CAPILLARY
Glucose-Capillary: 341 mg/dL — ABNORMAL HIGH (ref 65–99)
Glucose-Capillary: 356 mg/dL — ABNORMAL HIGH (ref 65–99)
Glucose-Capillary: 142 mg/dL — ABNORMAL HIGH (ref 65–99)
Glucose-Capillary: 103 mg/dL — ABNORMAL HIGH (ref 65–99)

## 2015-12-20 LAB — CBC
HCT: 29.8 % — ABNORMAL LOW (ref 39.0–52.0)
Hemoglobin: 9.6 g/dL — ABNORMAL LOW (ref 13.0–17.0)
MCH: 28.7 pg (ref 26.0–34.0)
MCHC: 32.2 g/dL (ref 30.0–36.0)
MCV: 89 fL (ref 78.0–100.0)
Platelets: 390 10*3/uL (ref 150–400)
RBC: 3.35 MIL/uL — ABNORMAL LOW (ref 4.22–5.81)
RDW: 15.9 % — ABNORMAL HIGH (ref 11.5–15.5)
WBC: 17.1 10*3/uL — ABNORMAL HIGH (ref 4.0–10.5)

## 2015-12-20 LAB — FOLATE: Folate: 22.3 ng/mL (ref >5.9)

## 2015-12-20 LAB — VITAMIN B12: Vitamin B-12: 2403 pg/mL — ABNORMAL HIGH (ref 180–914)

## 2015-12-20 LAB — MRSA PCR SCREENING: MRSA by PCR: NEGATIVE

## 2015-12-20 LAB — FERRITIN: Ferritin: 225 ng/mL (ref 24–336)

## 2015-12-20 LAB — STREP PNEUMONIAE URINARY ANTIGEN: Strep Pneumo Urinary Antigen: NEGATIVE

## 2015-12-20 MED ORDER — FUROSEMIDE 10 MG/ML IJ SOLN
60.0000 mg | Freq: Three times a day (TID) | INTRAMUSCULAR | Status: DC
  Administered 2015-12-20 – 2015-12-21 (×4): 60 mg via INTRAVENOUS
  Filled 2015-12-20 (×4): qty 6

## 2015-12-20 NOTE — Progress Notes (Signed)
Patient on HFNC 8 lpm , trying off NRB mask ,will see how oxygen saturation drops while sleeping. Patient seems to tolerate well.so far .Patien

## 2015-12-20 NOTE — Progress Notes (Signed)
**Note De-Identified Kenna Seward Obfuscation** Patient placed on 15 L HFNC so he can eat. Patient tolerating fairly well SAT 84%.  RRT to place NRB after meal.

## 2015-12-20 NOTE — Progress Notes (Addendum)
[facility] PROGRESS NOTE  Douglas Cannon ZOX:[id number] DOB: [birth year] DOA: 12/19/2015 PCP: No PCP Per Patient  Assessment/Plan:  #1 acute hypoxic respiratory failure  -Currently on nonrebreather mask, will need BiPAP if deteriorates -Suspect secondary to CHF and pneumonia  -Continue current antibiotics, nebs, cut down steroids, no wheezing noted -Add IV Lasix 60 mg every 8  -Check 2-D echocardiogram  -Monitor I/O, weights   #2 AKI vs CKD -Baseline creatinine unknown, last blood work was at endocrinologist's office in [place], I have requested records -Hold metformin and ACE inhibitor -Stop IV fluids secondary to volume overload -Follow-up 2-D echocardiogram, monitor urine output  #3 DM -Hold metformin, stop oral hypoglycemics  -Sliding-scale insulin for now   #4 hypertension  -BP stable, stop amlodipine to allow more room for diuretics  #5. Anemia -again baseline unknown, check anemia panel, no overt blood loss  #6. Diabetic foot wound -no overt infection, will ask wound RN for Recs  DVT prophylaxis: Lovenox   Code Status: Full COde Family Communication: wife at bedside Dispo: keep in SDU, home when resp status better  HPI/Subjective: C/o dyspnea, on NRM since this am, has fluid running at 100cc/hr  Objective: Filed Vitals:   12/20/15 1200 12/20/15 1300  BP: 111/75 110/75  Pulse: 103 103  Temp:    Resp: 18 23    Intake/Output Summary (Last 24 hours) at 12/20/15 1349 Last data filed at 12/20/15 0859  Gross per 24 hour  Intake    240 ml  Output      0 ml  Net    240 ml   Filed Weights   12/19/15 1216 12/19/15 2200 12/20/15 0400  Weight: 81.647 kg (180 lb) 98.8 kg (217 lb 13 oz) 90.6 kg (199 lb 11.8 oz)    Exam:   General:  AAOx3  Cardiovascular: S1S2/RRR  Respiratory: Crackles at bases  Abdomen: soft, Nt, BS present  Musculoskeletal: 2plus edema   Data Reviewed: Basic Metabolic Panel:  Recent Labs Lab 12/19/15 1311  12/20/15 0445  NA 140 136  K 4.5 4.6  CL 104 103  CO2 26 23  GLUCOSE 247* 399*  BUN 51* 50*  CREATININE 2.04* 2.06*  CALCIUM 8.7* 8.0*   Liver Function Tests: No results for input(s): AST, ALT, ALKPHOS, BILITOT, PROT, ALBUMIN in the last 168 hours. No results for input(s): LIPASE, AMYLASE in the last 168 hours. No results for input(s): AMMONIA in the last 168 hours. CBC:  Recent Labs Lab 12/19/15 1311 12/20/15 0445  WBC 16.0* 17.1*  NEUTROABS 12.9*  --   HGB 10.5* 9.6*  HCT 32.7* 29.8*  MCV 88.6 89.0  PLT 427* 390   Cardiac Enzymes: No results for input(s): CKTOTAL, CKMB, CKMBINDEX, TROPONINI in the last 168 hours. BNP (last 3 results)  Recent Labs  12/19/15 1311  BNP 4067.0*    ProBNP (last 3 results) No results for input(s): PROBNP in the last 8760 hours.  CBG:  Recent Labs Lab 12/19/15 2148 12/20/15 0733 12/20/15 1113  GLUCAP 359* 341* 356*    Recent Results (from the past 240 hour(s))  Culture, blood (routine x 2)     Status: None (Preliminary result)   Collection Time: 12/19/15  1:11 PM  Result Value Ref Range Status   Specimen Description BLOOD RIGHT ARM  Final   Special Requests BOTTLES DRAWN AEROBIC AND ANAEROBIC 8 CC EACH  Final   Culture PENDING  Incomplete   Report Status PENDING  Incomplete  Culture, blood (routine x 2)  Status: None (Preliminary result)   Collection Time: 12/19/15  1:15 PM  Result Value Ref Range Status   Specimen Description BLOOD LEFT ARM  Final   Special Requests BOTTLES DRAWN AEROBIC AND ANAEROBIC [facility] EACH  Final   Culture PENDING  Incomplete   Report Status PENDING  Incomplete  MRSA PCR Screening     Status: None   Collection Time: 12/19/15  9:50 PM  Result Value Ref Range Status   MRSA by PCR NEGATIVE NEGATIVE Final    Comment:        The GeneXpert MRSA Assay (FDA approved for NASAL specimens only), is one component of a comprehensive MRSA colonization surveillance program. It is not intended to  diagnose MRSA infection nor to guide or monitor treatment for MRSA infections.      Studies: Dg Chest 2 View  12/19/2015  CLINICAL DATA:  Cough and congestion for 1 week EXAM: CHEST  2 VIEW COMPARISON:  12/14/2015 FINDINGS: Cardiac shadow is stable. Increasing perihilar infiltrates are noted bilaterally worse on the left than the right. Right lower lobe infiltrate is noted as well. Bony structures are stable. IMPRESSION: Increasing bilateral infiltrates left greater than right. Electronically Signed   By: Alcide Clever M.D.   On: 12/19/2015 14:25    Scheduled Meds: . amLODipine  5 mg Oral BID  . aspirin EC  81 mg Oral Daily  . azithromycin  500 mg Intravenous Q24H  . cefTRIAXone (ROCEPHIN)  IV  1 g Intravenous Q24H  . citalopram  20 mg Oral Daily  . enoxaparin (LOVENOX) injection  40 mg Subcutaneous Q24H  . furosemide  60 mg Intravenous Q8H  . insulin aspart  0-20 Units Subcutaneous TID WC  . insulin aspart  0-5 Units Subcutaneous QHS  . methylPREDNISolone (SOLU-MEDROL) injection  60 mg Intravenous Q12H   Continuous Infusions:  Antibiotics Given (last 72 hours)    None      Active Problems:   CAP (community acquired pneumonia)   Diabetes mellitus without complication (HCC)   Hypertension   Acute renal injury (HCC)   Bronchospasm   Acute respiratory failure (HCC)    Time spent:    [facility]  [facility] Pager [phone]. If 7PM-7AM, please contact night-coverage at [email or web address], password [facility] 12/20/2015, 1:49 PM  LOS: 1 day

## 2015-12-20 NOTE — Progress Notes (Signed)
  Echocardiogram 2D echocardiogram has been performed.  Virgina Evener A 12/20/2015, 1:16 PM

## 2015-12-20 NOTE — Progress Notes (Signed)
UR Completed. Mehak Roskelley, RN, BSN.  [phone] 

## 2015-12-20 NOTE — Progress Notes (Signed)
Patient has maintained oxygen saturations on nrb mask in high 90's. He appears to be resting with out problems. Patient is Diabetic and has a large sore on his Right foot , he does not have feeling in foot. Suspect he is septic from his foot. Also his BNP is elevated 4067. Xray's show some edema pulmonary which is backed up by how quick his saturations fall when taken off oxygen. He also has elevated BUN 51 and creatinine 2.04 showing some kidney failure. BiPAP still on stand by.

## 2015-12-21 ENCOUNTER — Inpatient Hospital Stay (HOSPITAL_COMMUNITY): Payer: BLUE CROSS/BLUE SHIELD

## 2015-12-21 DIAGNOSIS — I10 Essential (primary) hypertension: Secondary | ICD-10-CM

## 2015-12-21 DIAGNOSIS — D509 Iron deficiency anemia, unspecified: Secondary | ICD-10-CM

## 2015-12-21 DIAGNOSIS — D72829 Elevated white blood cell count, unspecified: Secondary | ICD-10-CM

## 2015-12-21 LAB — BASIC METABOLIC PANEL
Anion gap: 11 (ref 5–15)
BUN: 66 mg/dL — ABNORMAL HIGH (ref 6–20)
CO2: 23 mmol/L (ref 22–32)
Calcium: 8.5 mg/dL — ABNORMAL LOW (ref 8.9–10.3)
Chloride: 105 mmol/L (ref 101–111)
Creatinine, Ser: 2.09 mg/dL — ABNORMAL HIGH (ref 0.61–1.24)
GFR calc Af Amer: 39 mL/min — ABNORMAL LOW (ref >60)
GFR calc non Af Amer: 33 mL/min — ABNORMAL LOW (ref >60)
Glucose, Bld: 139 mg/dL — ABNORMAL HIGH (ref 65–99)
Potassium: 4.1 mmol/L (ref 3.5–5.1)
Sodium: 139 mmol/L (ref 135–145)

## 2015-12-21 LAB — GLUCOSE, CAPILLARY
Glucose-Capillary: 143 mg/dL — ABNORMAL HIGH (ref 65–99)
Glucose-Capillary: 220 mg/dL — ABNORMAL HIGH (ref 65–99)
Glucose-Capillary: 240 mg/dL — ABNORMAL HIGH (ref 65–99)
Glucose-Capillary: 235 mg/dL — ABNORMAL HIGH (ref 65–99)

## 2015-12-21 LAB — HIV ANTIBODY (ROUTINE TESTING W REFLEX): HIV Screen 4th Generation wRfx: NONREACTIVE

## 2015-12-21 LAB — CBC
HCT: 29.9 % — ABNORMAL LOW (ref 39.0–52.0)
Hemoglobin: 9.6 g/dL — ABNORMAL LOW (ref 13.0–17.0)
MCH: 28.5 pg (ref 26.0–34.0)
MCHC: 32.1 g/dL (ref 30.0–36.0)
MCV: 88.7 fL (ref 78.0–100.0)
Platelets: 362 10*3/uL (ref 150–400)
RBC: 3.37 MIL/uL — ABNORMAL LOW (ref 4.22–5.81)
RDW: 16.5 % — ABNORMAL HIGH (ref 11.5–15.5)
WBC: 29.4 10*3/uL — ABNORMAL HIGH (ref 4.0–10.5)

## 2015-12-21 LAB — CREATININE, URINE, RANDOM: Creatinine, Urine: 33.5 mg/dL

## 2015-12-21 LAB — SODIUM, URINE, RANDOM: Sodium, Ur: 83 mmol/L

## 2015-12-21 LAB — BRAIN NATRIURETIC PEPTIDE: B Natriuretic Peptide: 2564 pg/mL — ABNORMAL HIGH (ref 0.0–100.0)

## 2015-12-21 MED ORDER — DIPHENHYDRAMINE HCL 25 MG PO CAPS
25.0000 mg | ORAL_CAPSULE | Freq: Once | ORAL | Status: AC
  Administered 2015-12-21: 25 mg via ORAL
  Filled 2015-12-21: qty 1

## 2015-12-21 MED ORDER — METOPROLOL TARTRATE 25 MG PO TABS
25.0000 mg | ORAL_TABLET | Freq: Two times a day (BID) | ORAL | Status: DC
  Administered 2015-12-21 – 2015-12-26 (×11): 25 mg via ORAL
  Filled 2015-12-21 (×12): qty 1

## 2015-12-21 MED ORDER — FUROSEMIDE 10 MG/ML IJ SOLN
80.0000 mg | Freq: Three times a day (TID) | INTRAMUSCULAR | Status: DC
  Administered 2015-12-21 – 2015-12-26 (×15): 80 mg via INTRAVENOUS
  Filled 2015-12-21 (×15): qty 8

## 2015-12-21 MED ORDER — PREDNISONE 20 MG PO TABS
40.0000 mg | ORAL_TABLET | Freq: Every day | ORAL | Status: DC
  Administered 2015-12-21 – 2015-12-22 (×2): 40 mg via ORAL
  Filled 2015-12-21 (×2): qty 2

## 2015-12-21 NOTE — Progress Notes (Addendum)
[facility] PROGRESS NOTE  Mohammedali Bedoy QMV:[id number] DOB: Mar 17, 1956 DOA: 12/19/2015 PCP: No PCP Per Patient  Negative 60 year old male with history of diabetes (on oral hypoglycemics), hypertension who recently from [place]  presented to the ED with one week history of worsening cough with shortness of breath. He was seen at an urgent care on 1/29 and was prescribed Levaquin for findings of bilateral infiltrates on chest x-ray with a diagnosis of community-acquired pneumonia. However he continue to have dyspnea worsened on exertion. ED patient was found to have acute hypoxic respiratory failure with oxygen desaturation in the 70s and wheezing. Had 2+ pitting edema bilaterally. He was placed on BiPAP and admitted to step down unit.  of note,  patient lived in [place] and moved here recently.  he reports being diagnosed of diabetes about 2 years back and has been on metformin. He was seeing an endocrinologist and was last seen about a year and a half back. He reports that he is not very regular with checking his blood glucose and taking his medications.    Assessment/Plan: Acute hypoxic respiratory failure Suspect a combination of community acquired pneumonia and acute CHF. (Had BNP in 4000s and 2+ pitting edema bilaterally). On empiric Rocephin and azithromycin. Cultures negative. -On IV Lasix 60 mg every 8 hours (patient now off BiPAP and requiring up to 10 L high flow O2 via nasal cannula). I will increase his Lasix to 80 mg every 8 hours. Monitor strict I/O. -2-D echo surprisingly shows a normal EF of 55 and 60% with no wall motion abnormality. Has mild tricuspid regurgitation and elevated pulmonary artery pressure 43 mmHg. Possibly has acute diastolic dysfunction (which was not able to evaluate on 2-D echo) -added aspirin. Check lipid panel. Add low-dose metoprolol. BNP improved to 2500 today. Switch steroid to oral prednisone. -May need to be placed back on NRB if his hypoxic  despite increasing Lasix. repeat chest x-ray in a.m.    Acute versus acute on chronic kidney dis Does not remember having any kidney issues. Hold HCTZ and ACE inhibitor. Monitor with diuresis. Check urine lites and renal ultrasound.  Faxed out request to his endocrinologist  [place] to send his previous labs.  Anemia Low normal iron panel. Check stool for occult blood. Patient will need referral to GI as outpatient. B12 and folate are normal.Is on B12 supplements at home.  Type 2  diabetes mellitus Holding metformin-linagliptin and Amaryl. On sliding scale coverage. Check A1c. Holding ACE inhibitor.   Essential hypertension Holding HCTZ and ACE inhibitor given worsened renal function. Holding amlodipine given his severe leg swellings. We'll add low-dose beta blocker.  Leukocytosis Possibly in the setting of steroid use. Will transition zone and Medrol to oral prednisone.   Diabetic foot ulcer ? Infected Has foul smell with leucocytosis. Will check MRI of  the foot to r/o  underlying osteomylitis  Code Status: Full code Family communication: Wife at bedside Disposition : Continue step down monitoring   Consults: none   Procedures:  none  Antibiotics:  IV  and azithromycin since 2/3  HPI/Subjective:  Seen and examined. Reports his breathing to be better and denies any wheezing. Still requiring high amount of o2.    Objective: Filed Vitals:   12/21/15 0800 12/21/15 0900  BP: 129/89 112/80  Pulse: 102 99  Temp:  97.9 F (36.6 C)  Resp: 15 23    Intake/Output Summary (Last 24 hours) at 12/21/15 1016 Last data filed at 12/21/15 0607  Gross per 24 hour  Intake    540 ml  Output    700 ml  Net   -160 ml   Filed Weights   12/19/15 2200 12/20/15 0400 12/21/15 0500  Weight: 98.8 kg (217 lb 13 oz) 90.6 kg (199 lb 11.8 oz) 90 kg (198 lb 6.6 oz)    Exam:   General:  Middle aged male not in distress  HEENT: No pallor, moist mucosa, JVD  +  Cardiovascular:Normal S1 and S2, no murmurs rub gallop  Chest: Bibasilar crackles, no rhonchi or wheeze  Abdomen: Nondistended, nontender, bowel sounds present  Musculoskeletal: Warm, 2+ pitting edema bilaterally, ulceration over rt foot ( 3rd toe with foul smell)  CNS: Alert and oriented  Labs reviewed: Basic Metabolic Panel:  Recent Labs Lab 12/19/15 1311 12/20/15 0445 12/21/15 0437  NA 140 136 139  K 4.5 4.6 4.1  CL 104 103 105  CO2 26 23 23   GLUCOSE 247* 399* 139*  BUN 51* 50* 66*  CREATININE 2.04* 2.06* 2.09*  CALCIUM 8.7* 8.0* 8.5*   Liver Function Tests: No results for input(s): AST, ALT, ALKPHOS, BILITOT, PROT, ALBUMIN in the last 168 hours. No results for input(s): LIPASE, AMYLASE in the last 168 hours. No results for input(s): AMMONIA in the last 168 hours. CBC:  Recent Labs Lab 12/19/15 1311 12/20/15 0445 12/21/15 0437  WBC 16.0* 17.1* 29.4*  NEUTROABS 12.9*  --   --   HGB 10.5* 9.6* 9.6*  HCT 32.7* 29.8* 29.9*  MCV 88.6 89.0 88.7  PLT 427* 390 362   Cardiac Enzymes: No results for input(s): CKTOTAL, CKMB, CKMBINDEX, TROPONINI in the last 168 hours. BNP (last 3 results)  Recent Labs  12/19/15 1311 12/21/15 0437  BNP 4067.0* 2564.0*    ProBNP (last 3 results) No results for input(s): PROBNP in the last 8760 hours.  CBG:  Recent Labs Lab 12/20/15 0733 12/20/15 1113 12/20/15 1626 12/20/15 2118 12/21/15 0725  GLUCAP 341* 356* 142* 103* 143*    Recent Results (from the past 240 hour(s))  Culture, blood (routine x 2)     Status: None (Preliminary result)   Collection Time: 12/19/15  1:11 PM  Result Value Ref Range Status   Specimen Description BLOOD RIGHT ARM  Final   Special Requests BOTTLES DRAWN AEROBIC AND ANAEROBIC 8 CC EACH  Final   Culture NO GROWTH 2 DAYS  Final   Report Status PENDING  Incomplete  Culture, blood (routine x 2)     Status: None (Preliminary result)   Collection Time: 12/19/15  1:15 PM  Result Value Ref  Range Status   Specimen Description BLOOD LEFT ARM  Final   Special Requests BOTTLES DRAWN AEROBIC AND ANAEROBIC 9CC EACH  Final   Culture NO GROWTH 2 DAYS  Final   Report Status PENDING  Incomplete  MRSA PCR Screening     Status: None   Collection Time: 12/19/15  9:50 PM  Result Value Ref Range Status   MRSA by PCR NEGATIVE NEGATIVE Final    Comment:        The GeneXpert MRSA Assay (FDA approved for NASAL specimens only), is one component of a comprehensive MRSA colonization surveillance program. It is not intended to diagnose MRSA infection nor to guide or monitor treatment for MRSA infections.      Studies: Dg Chest 2 View  12/19/2015  CLINICAL DATA:  Cough and congestion for 1 week EXAM: CHEST  2 VIEW COMPARISON:  12/14/2015 FINDINGS: Cardiac shadow is stable. Increasing perihilar infiltrates are noted bilaterally  worse on the left than the right. Right lower lobe infiltrate is noted as well. Bony structures are stable. IMPRESSION: Increasing bilateral infiltrates left greater than right. Electronically Signed   By: Alcide Clever M.D.   On: 12/19/2015 14:25    Scheduled Meds: . aspirin EC  81 mg Oral Daily  . azithromycin  500 mg Intravenous Q24H  . cefTRIAXone (ROCEPHIN)  IV  1 g Intravenous Q24H  . citalopram  20 mg Oral Daily  . enoxaparin (LOVENOX) injection  40 mg Subcutaneous Q24H  . furosemide  80 mg Intravenous Q8H  . insulin aspart  0-20 Units Subcutaneous TID WC  . insulin aspart  0-5 Units Subcutaneous QHS  . predniSONE  40 mg Oral Q breakfast   Continuous Infusions:     Time spent: 35 minutes    Sem Mccaughey  [facility] Pager [phone]. If 7PM-7AM, please contact night-coverage at [email or web address], password [facility] 12/21/2015, 10:16 AM  LOS: 2 days

## 2015-12-21 NOTE — Consult Note (Signed)
WOC wound consult note Reason for Consult:full thickness diabetic foot ulcer at 3rd metatarsal head on right foot, chronic non-healing Wound type:Neuropathic Pressure Ulcer POA: No Measurement:2cm x 1.5cm x 0.8cm with soft, white callous at periphery Wound bed:red, moist Drainage (amount, consistency, odor) moderate amount serosanguinous  Periwound:macerated callous Dressing procedure/placement/frequency:Patient care plan will include a silver absorbant dressing as wound filler, but longer-term care plan is needed.  Consideration of an orthopedic consult is indicated to rule out osteomyelitis, then serial debridements of callous and off-loading protective shoe wear.  Referral to an outpatient wound care clinic of his choosing is recommended for those services. WOC nursing team will not follow, but will remain available to this patient, the nursing and medical teams.  Please re-consult if needed. Thanks, Ladona Mow, MSN, RN, GNP, [facility]  Pager# [phone]

## 2015-12-22 ENCOUNTER — Inpatient Hospital Stay (HOSPITAL_COMMUNITY): Payer: BLUE CROSS/BLUE SHIELD

## 2015-12-22 DIAGNOSIS — M86171 Other acute osteomyelitis, right ankle and foot: Secondary | ICD-10-CM

## 2015-12-22 DIAGNOSIS — M869 Osteomyelitis, unspecified: Secondary | ICD-10-CM | POA: Diagnosis present

## 2015-12-22 LAB — GLUCOSE, CAPILLARY
Glucose-Capillary: 124 mg/dL — ABNORMAL HIGH (ref 65–99)
Glucose-Capillary: 184 mg/dL — ABNORMAL HIGH (ref 65–99)
Glucose-Capillary: 218 mg/dL — ABNORMAL HIGH (ref 65–99)
Glucose-Capillary: 266 mg/dL — ABNORMAL HIGH (ref 65–99)

## 2015-12-22 LAB — BASIC METABOLIC PANEL
Anion gap: 10 (ref 5–15)
BUN: 71 mg/dL — ABNORMAL HIGH (ref 6–20)
CO2: 27 mmol/L (ref 22–32)
Calcium: 8.5 mg/dL — ABNORMAL LOW (ref 8.9–10.3)
Chloride: 104 mmol/L (ref 101–111)
Creatinine, Ser: 2.12 mg/dL — ABNORMAL HIGH (ref 0.61–1.24)
GFR calc Af Amer: 38 mL/min — ABNORMAL LOW (ref >60)
GFR calc non Af Amer: 33 mL/min — ABNORMAL LOW (ref >60)
Glucose, Bld: 119 mg/dL — ABNORMAL HIGH (ref 65–99)
Potassium: 3.7 mmol/L (ref 3.5–5.1)
Sodium: 141 mmol/L (ref 135–145)

## 2015-12-22 LAB — CBC
HCT: 29.1 % — ABNORMAL LOW (ref 39.0–52.0)
Hemoglobin: 9.5 g/dL — ABNORMAL LOW (ref 13.0–17.0)
MCH: 28.7 pg (ref 26.0–34.0)
MCHC: 32.6 g/dL (ref 30.0–36.0)
MCV: 87.9 fL (ref 78.0–100.0)
Platelets: 335 10*3/uL (ref 150–400)
RBC: 3.31 MIL/uL — ABNORMAL LOW (ref 4.22–5.81)
RDW: 16.4 % — ABNORMAL HIGH (ref 11.5–15.5)
WBC: 22.2 10*3/uL — ABNORMAL HIGH (ref 4.0–10.5)

## 2015-12-22 MED ORDER — GUAIFENESIN ER 600 MG PO TB12
1200.0000 mg | ORAL_TABLET | Freq: Two times a day (BID) | ORAL | Status: DC
  Administered 2015-12-22 – 2015-12-26 (×8): 1200 mg via ORAL
  Filled 2015-12-22 (×9): qty 2

## 2015-12-22 MED ORDER — ALBUTEROL SULFATE (2.5 MG/3ML) 0.083% IN NEBU: 2.5000 mg | INHALATION_SOLUTION | RESPIRATORY_TRACT | Status: DC | PRN

## 2015-12-22 MED ORDER — VANCOMYCIN HCL 10 G IV SOLR
1750.0000 mg | Freq: Once | INTRAVENOUS | Status: AC
  Administered 2015-12-22: 1750 mg via INTRAVENOUS
  Filled 2015-12-22: qty 1750

## 2015-12-22 MED ORDER — VANCOMYCIN HCL IN DEXTROSE 750-5 MG/150ML-% IV SOLN
750.0000 mg | Freq: Two times a day (BID) | INTRAVENOUS | Status: DC
  Administered 2015-12-22 – 2015-12-25 (×7): 750 mg via INTRAVENOUS
  Filled 2015-12-22 (×14): qty 150

## 2015-12-22 MED ORDER — DIPHENHYDRAMINE HCL 25 MG PO CAPS
50.0000 mg | ORAL_CAPSULE | Freq: Once | ORAL | Status: AC
  Administered 2015-12-22: 50 mg via ORAL
  Filled 2015-12-22: qty 2

## 2015-12-22 NOTE — Care Management Note (Signed)
Case Management Note  Patient Details  Name: Ancil Dewan MRN: [id number] Date of Birth: 09-06-1956  Subjective/Objective:                  Pt is from home, lives with his wife and is ind with ADL's, working a full time job. Pt found to have osteo and may require IV abx at DC. Pt is agreeable to self administer IV abx at home.  Pt would like IV abx supplied by [facility], if needed.   Action/Plan: Will cont to follow for DC planning needs.   Expected Discharge Date:     12/25/2015             Expected Discharge Plan:  Home w Home Health Services  In-House Referral:  NA  Discharge planning Services  CM Consult  Post Acute Care Choice:  Home Health Choice offered to:  Patient  DME Arranged:    DME Agency:     HH Arranged:    HH Agency:     Status of Service:  In process, will continue to follow  Medicare Important Message Given:    Date Medicare IM Given:    Medicare IM give by:    Date Additional Medicare IM Given:    Additional Medicare Important Message give by:     If discussed at Long Length of Stay Meetings, dates discussed:    Additional Comments:  Malcolm Metro, RN 12/22/2015, 4:00 PM

## 2015-12-22 NOTE — Progress Notes (Signed)
[facility] PROGRESS NOTE  Chima Astorino BJY:[id number] DOB: [birth year] DOA: 12/19/2015 PCP: No PCP Per Patient  Summary: 60 year old male with history of diabetes (on oral hypoglycemics), hypertension who recently relocated from [place], presented to the ED with one week history of worsening cough with shortness of breath. He was seen at an urgent care on 1/29 and was prescribed Levaquin for findings of bilateral infiltrates on chest x-ray with a diagnosis of community-acquired pneumonia. However he continued to have dyspnea worsened on exertion. In the ED, patient was found to have acute hypoxic respiratory failure with oxygen desaturation in the 70s with wheezing. He had 2+ pitting edema bilaterally. He was placed on BiPAP and admitted to step down unit.  Of note,  patient lived in [place] and moved here recently.  he reports being diagnosed of diabetes about 2 years back and has been on metformin. He was seeing an endocrinologist and was last seen about a year and a half back. He reports that he is not very regular with checking his blood glucose and taking his medications.    Assessment/Plan: Acute hypoxic respiratory failure Suspect a combination of community acquired pneumonia and acute CHF. (Had BNP in 4000s and 2+ pitting edema bilaterally). On empiric Rocephin and azithromycin. Cultures negative. -On IV Lasix 60 mg every 8 hours (patient now off BiPAP and requiring up to 10 L high flow O2 via nasal cannula). I will increase his Lasix to 80 mg every 8 hours. Monitor strict I/O. -2-D echo surprisingly shows a normal EF of 55 and 60% with no wall motion abnormality. Has mild tricuspid regurgitation and elevated pulmonary artery pressure 43 mmHg. Possibly has acute diastolic dysfunction (which was not able to evaluate on 2-D echo) -added aspirin. Check lipid panel. Add low-dose metoprolol. BNP improved to 2500 today. Switch steroid to oral prednisone. -May need to be placed back  on NRB if his hypoxic despite increasing Lasix. repeat chest x-ray in a.m.   Community-acquired pneumonia Chest x-ray shows persistent consolidation. He continues to cough. Continue intravenous antibiotics.  Acute diastolic congestive heart failure Weight is down approximately 5 pounds since admission. Intake and output has not been reliably recorded. Continue intravenous Lasix for now. He will likely need to be set up with cardiology as an outpatient.   Acute versus acute on chronic kidney dis Patient is unsure if this is a chronic issue. Hold HCTZ and ACE inhibitor. Monitor with diuresis. Renal ultrasound does not show any hydronephrosis..  Records from his endocrinologist office in [place] have been requested.  Anemia Low normal iron panel. Check stool for occult blood. Patient will need referral to GI as outpatient. B12 and folate are normal.Is on B12 supplements at home.  Type 2  diabetes mellitus Holding metformin-linagliptin and Amaryl. On sliding scale coverage. Check A1c. Holding ACE inhibitor.   Essential hypertension Holding HCTZ and ACE inhibitor given abnormal renal function. Holding amlodipine given his severe leg swellings. We'll add low-dose beta blocker.blood pressure stable  Leukocytosis Possibly in the setting of steroid use/osteomyelitis. Discontinue steroids since bronchospasms have resolved.   Osteomyelitis of right foot MRI of right foot indicates underlying osteomyelitis with pus. Will add vancomycin to antibiotic coverage. Podiatry consult for further management.  Code Status: Full code Family communication: Wife at bedside Disposition : transfer to telemetry   Consults: none   Procedures:  none  Antibiotics:  Rocephin and azithromycin since 2/3  HPI/Subjective: Feeling better. Feels shortness of breath is improving. Continues to cough. Feels edema in extremities is  improving   Objective: Filed Vitals:   12/22/15 0700 12/22/15 0730   BP: 120/84   Pulse: 72   Temp:  97.1 F (36.2 C)  Resp: 21     Intake/Output Summary (Last 24 hours) at 12/22/15 0959 Last data filed at 12/21/15 1858  Gross per 24 hour  Intake    300 ml  Output    350 ml  Net    -50 ml   Filed Weights   12/20/15 0400 12/21/15 0500 12/22/15 0500  Weight: 90.6 kg (199 lb 11.8 oz) 90 kg (198 lb 6.6 oz) 88.3 kg (194 lb 10.7 oz)    Exam:   General:  Middle aged male not in distress  HEENT: No pallor, moist mucosa,   Cardiovascular:Normal S1 and S2, no murmurs rub gallop  Chest: Bibasilar crackles, no rhonchi or wheeze  Abdomen: Nondistended, nontender, bowel sounds present  Musculoskeletal: Warm, 1+ pitting edema bilaterally, ulceration over planta aspect of rt foot ( 3rd toe with foul smell)  CNS: Alert and oriented  Labs reviewed: Basic Metabolic Panel:  Recent Labs Lab 12/19/15 1311 12/20/15 0445 12/21/15 0437 12/22/15 0432  NA 140 136 139 141  K 4.5 4.6 4.1 3.7  CL 104 103 105 104  CO2 GLUCOSE 247* 399* 139* 119*  BUN 51* 50* 66* 71*  CREATININE 2.04* 2.06* 2.09* 2.12*  CALCIUM 8.7* 8.0* 8.5* 8.5*   Liver Function Tests: No results for input(s): AST, ALT, ALKPHOS, BILITOT, PROT, ALBUMIN in the last 168 hours. No results for input(s): LIPASE, AMYLASE in the last 168 hours. No results for input(s): AMMONIA in the last 168 hours. CBC:  Recent Labs Lab 12/19/15 1311 12/20/15 0445 12/21/15 0437 12/22/15 0432  WBC 16.0* 17.1* 29.4* 22.2*  NEUTROABS 12.9*  --   --   --   HGB 10.5* 9.6* 9.6* 9.5*  HCT 32.7* 29.8* 29.9* 29.1*  MCV 88.6 89.0 88.7 87.9  PLT 427* 390 362 335   Cardiac Enzymes: No results for input(s): CKTOTAL, CKMB, CKMBINDEX, TROPONINI in the last 168 hours. BNP (last 3 results)  Recent Labs  12/19/15 1311 12/21/15 0437  BNP 4067.0* 2564.0*    ProBNP (last 3 results) No results for input(s): PROBNP in the last 8760 hours.  CBG:  Recent Labs Lab 12/21/15 0725  12/21/15 1131 12/21/15 1612 12/21/15 2141 12/22/15 0724  GLUCAP 143* 220* 240* 235* 124*    Recent Results (from the past 240 hour(s))  Culture, blood (routine x 2)     Status: None (Preliminary result)   Collection Time: 12/19/15  1:11 PM  Result Value Ref Range Status   Specimen Description BLOOD RIGHT ARM  Final   Special Requests BOTTLES DRAWN AEROBIC AND ANAEROBIC 8 CC EACH  Final   Culture NO GROWTH 2 DAYS  Final   Report Status PENDING  Incomplete  Culture, blood (routine x 2)     Status: None (Preliminary result)   Collection Time: 12/19/15  1:15 PM  Result Value Ref Range Status   Specimen Description BLOOD LEFT ARM  Final   Special Requests BOTTLES DRAWN AEROBIC AND ANAEROBIC 9CC EACH  Final   Culture NO GROWTH 2 DAYS  Final   Report Status PENDING  Incomplete  MRSA PCR Screening     Status: None   Collection Time: 12/19/15  9:50 PM  Result Value Ref Range Status   MRSA by PCR NEGATIVE NEGATIVE Final    Comment:        The  GeneXpert MRSA Assay (FDA approved for NASAL specimens only), is one component of a comprehensive MRSA colonization surveillance program. It is not intended to diagnose MRSA infection nor to guide or monitor treatment for MRSA infections.      Studies: Dg Chest 2 View  12/22/2015  CLINICAL DATA:  Community acquired pneumonia.  Acute renal injury. EXAM: CHEST  2 VIEW COMPARISON:  12/19/2015 FINDINGS: Bilateral pulmonary infiltrates again seen with greatest consolidation in the left upper lobe. Small bilateral pleural effusions again noted. These findings show no significant interval change. Heart size remains stable. IMPRESSION: No significant change in bilateral infiltrates with maximal consolidation involving the left upper lobe. Small bilateral pleural effusions also without significant change. Electronically Signed   By: Myles Rosenthal M.D.   On: 12/22/2015 08:44   US Renal  12/21/2015  CLINICAL DATA:  60 year old male with acute renal injury.  Hypertension. Initial encounter. EXAM: RENAL / URINARY TRACT ULTRASOUND COMPLETE COMPARISON:  Chest radiographs 12/19/2015 FINDINGS: Right Kidney: Length: 11.3 cm. Echogenicity within normal limits. No mass or hydronephrosis visualized. Left Kidney: Length: 11.6 cm. Left kidney less well visualized Echogenicity within normal limits. No mass or hydronephrosis visualized. Bladder: Appears normal for degree of bladder distention. Other findings: Bilateral pleural effusions re- demonstrated. IMPRESSION: 1. Normal sonographic appearance of the kidneys and bladder. 2. Bilateral pleural effusions. Electronically Signed   By: Odessa Fleming M.D.   On: 12/21/2015 12:28   Mr Foot Right Wo Contrast  12/22/2015  CLINICAL DATA:  Diabetic ulcer of the plantar aspect of the right foot at the third through fifth digits. EXAM: MRI OF THE RIGHT FOREFOOT WITHOUT CONTRAST TECHNIQUE: Multiplanar, multisequence MR imaging was performed. No intravenous contrast was administered. COMPARISON:  None. FINDINGS: There is osteomyelitis in the third metatarsal and of the base of the proximal phalanx of the third toe. There is a pathologic fracture shaft of the third metatarsal. There is subperiosteal pus along the distal third metatarsal. The third metatarsal phalangeal joint is infected and there is dorsal dislocation of the proximal phalanx of the third toe. The pus extends to the plantar aspect of the ball of the foot communicating with the soft tissue ulcer. There is a 6 mm area of low signal at the dorsal aspect of the distal fourth metatarsal in the subcutaneous soft tissues which could represent a foreign body or gas in the soft tissues. There is pus surrounding this abnormality, probably arising from the third metatarsal phalangeal joint There is slight edema in the distal fourth metatarsal which is most likely reactive. There is nonspecific subcutaneous edema of the distal forefoot. Soft tissue ulceration of the plantar aspect of the ball of  the foot extends from just lateral to the head of the second metatarsal to the fifth metatarsal phalangeal joint. IMPRESSION: Osteomyelitis of the entire third metatarsal as well as the proximal phalanx of the third toe with a septic third metatarsal phalangeal joint with pus extending dorsally and to the plantar surface of the ball of the foot at the site of the ulcer. Electronically Signed   By: Francene Boyers M.D.   On: 12/22/2015 08:53    Scheduled Meds: . aspirin EC  81 mg Oral Daily  . azithromycin  500 mg Intravenous Q24H  . cefTRIAXone (ROCEPHIN)  IV  1 g Intravenous Q24H  . citalopram  20 mg Oral Daily  . enoxaparin (LOVENOX) injection  40 mg Subcutaneous Q24H  . furosemide  80 mg Intravenous Q8H  . insulin aspart  0-20  Units Subcutaneous TID WC  . insulin aspart  0-5 Units Subcutaneous QHS  . metoprolol tartrate  25 mg Oral BID   Continuous Infusions:     Time spent: 35 minutes    Manha Amato  [facility] Pager [phone]. If 7PM-7AM, please contact night-coverage at [email or web address], password [facility] 12/22/2015, 9:59 AM  LOS: 3 days

## 2015-12-22 NOTE — Progress Notes (Signed)
Inpatient Diabetes Program Recommendations  AACE/ADA: New Consensus Statement on Inpatient Glycemic Control (2015)  Target Ranges:  Prepandial:   less than 140 mg/dL      Peak postprandial:   less than 180 mg/dL (1-2 hours)      Critically ill patients:  140 - 180 mg/dL  Results for [name], [name] (MRN [id number]) as of 12/22/2015 09:26  Ref. Range 12/21/2015 07:25 12/21/2015 11:31 12/21/2015 16:12 12/21/2015 21:41 12/22/2015 07:24  Glucose-Capillary Latest Ref Range: 65-99 mg/dL 086 (H) 578 (H) 469 (H) 235 (H) 124 (H)   Review of Glycemic Control  Diabetes history: DM2 Outpatient Diabetes medications: Amaryl 4 mg QAM, Jentadueto 2.03-999 mg BID Current orders for Inpatient glycemic control: Novolog 0-20 units TID with meals, Novolog 0-5 units QHS  Inpatient Diabetes Program Recommendations: Insulin - Meal Coverage: Post prandial glucose is consistently elevated.  If steroids are continued as ordered (Prednisone 40 mg QAM), please consider orderig Novolog 4 units TID with meals. Please note, if meal coverage is ordered as requested it will need to be adjusted as steroids are tapered. HgbA1C: A1C in process.  Thanks, Orlando Penner, RN, MSN, CDE Diabetes Coordinator Inpatient Diabetes Program [phone] (Team Pager from 8am to 5pm) [phone] ([facility] office) [phone] [facility] office) [phone] [facility] office)

## 2015-12-22 NOTE — Consult Note (Signed)
Podiatry Consult Note  Reason for Consultation:  Osteomyelitis, right foot  History of Present Illness: Douglas Cannon is a 60 y.o. male admitted 12/19/2015 with community-acquired pneumonia.  Ulceration of the right foot was noted.  MRI performed which revealed osteomyelitis of the right third toe and right third metatarsal.  Wound care RN consulted and recommendations noted.    The patient related that he first noticed changes along the plantar aspect of his right foot about 2-3 weeks ago.  He picked at a callus which subsequently developed into an ulceration.  He treated the area at home with hydrogen peroxide.    He was diagnosed with diabetes 2 years ago.  His A1c at diagnosis was 15.  He does not check his blood glucose regularly.  He does not wear diabetic shoes.  He has a history of a left foot ulceration which is currently healed.  He moved to the area from [place] for work.  He is [employer] with [employer].     Past Medical History  Diagnosis Date  . Diabetes mellitus without complication (HCC)   . Hypertension    Scheduled Meds: . aspirin EC  81 mg Oral Daily  . azithromycin  500 mg Intravenous Q24H  . cefTRIAXone (ROCEPHIN)  IV  1 g Intravenous Q24H  . citalopram  20 mg Oral Daily  . enoxaparin (LOVENOX) injection  40 mg Subcutaneous Q24H  . furosemide  80 mg Intravenous Q8H  . guaiFENesin  1,200 mg Oral BID  . insulin aspart  0-20 Units Subcutaneous TID WC  . insulin aspart  0-5 Units Subcutaneous QHS  . metoprolol tartrate  25 mg Oral BID  . vancomycin  750 mg Intravenous Q12H   Continuous Infusions:  PRN Meds:.albuterol  No Known Allergies History reviewed. No pertinent past surgical history. History reviewed. No pertinent family history. Social History:  reports that he has never smoked. He does not have any smokeless tobacco history on file. He reports that he does not drink alcohol or use illicit drugs.  Review of Systems: Significant for diabetic foot  ulcer.  He denies current nausea, vomiting, fever or chills.  Physical Examination: Vital signs in last 24 hours:   Temp:  [96.9 F (36.1 C)-97.6 F (36.4 C)] 97.2 F (36.2 C) (02/06 1630) Pulse Rate:  [62-85] 79 (02/06 1700) Resp:  [13-28] 23 (02/06 1700) BP: (84-129)/(61-95) 126/84 mmHg (02/06 1700) SpO2:  [93 %-100 %] 94 % (02/06 1700) Weight:  [194 lb 10.7 oz (88.3 kg)] 194 lb 10.7 oz (88.3 kg) (02/06 0500)  DRESSING: lClean, dry and intact. INTEGUMENT:  Skin is warm, dry and atrophic bilaterally.  There is erythema along the dorsal aspect of the right forefoot and midfoot.  A focal hyperkeratotic lesion is present along the distal aspect of the left third toe.    Wound Location:  Plantar to the right third metatarsal head  Wound Etiology:  Neuropathic   Wound Base:  Necrotic Wound Color:  Yellow and gray Wound Edges/Periwound:  Macerated hyperkeratotic; skin color is red Exudate:  Moderate purulence Odor:  Positive Probe to Bone:  Yes Signs of Infection:  Yes  Pre-debridement Measurement:  2.1 cm in length by 1.5 cm in width by 0.9 cm in depth extending to the level of the bone Post-debridement Measurement:  No debridement performed today.  Wagner Ulcer Classification:  Grade 3  Reference:   Wagner Ulcer Classification System Grade 1:  Superficial diabetic ulcer Grade 2:  Ulcer extension involving ligament, tendon, joint capsule  with no abscess or osteomyelitis Grade 3:  Deep ulcer with abscess or osteomyelitis Grade 4:  Gangrene to portion of forefoot Grade 5:  Extensive gangrene of foot  VASCULAR:  Dorsalis pedis pulse is 1/4 bilaterally.  Posterior tibial pulse is nonpalpable bilaterally.  There is edema of both lower extremities (right > left). NEUROLOGIC:  Protective sensation is diminished as evaluated with a Semmes Weinstein 5.07 monofilament.  Lab/Test Results:   Recent Labs  12/21/15 0437 12/22/15 0432  WBC 29.4* 22.2*  HGB 9.6* 9.5*  HCT 29.9* 29.1*   PLT 362 335  NA 139 141  K 4.1 3.7  CL 105 104  CO2 23 27  BUN 66* 71*  CREATININE 2.09* 2.12*  GLUCOSE 139* 119*  CALCIUM 8.5* 8.5*    Recent Results (from the past 240 hour(s))  Culture, blood (routine x 2)     Status: None (Preliminary result)   Collection Time: 12/19/15  1:11 PM  Result Value Ref Range Status   Specimen Description BLOOD RIGHT ARM  Final   Special Requests BOTTLES DRAWN AEROBIC AND ANAEROBIC 8 CC EACH  Final   Culture NO GROWTH 3 DAYS  Final   Report Status PENDING  Incomplete  Culture, blood (routine x 2)     Status: None (Preliminary result)   Collection Time: 12/19/15  1:15 PM  Result Value Ref Range Status   Specimen Description BLOOD LEFT ARM  Final   Special Requests BOTTLES DRAWN AEROBIC AND ANAEROBIC 9CC EACH  Final   Culture NO GROWTH 3 DAYS  Final   Report Status PENDING  Incomplete  MRSA PCR Screening     Status: None   Collection Time: 12/19/15  9:50 PM  Result Value Ref Range Status   MRSA by PCR NEGATIVE NEGATIVE Final    Comment:        The GeneXpert MRSA Assay (FDA approved for NASAL specimens only), is one component of a comprehensive MRSA colonization surveillance program. It is not intended to diagnose MRSA infection nor to guide or monitor treatment for MRSA infections.      Dg Chest 2 View  12/22/2015  CLINICAL DATA:  Community acquired pneumonia.  Acute renal injury. EXAM: CHEST  2 VIEW COMPARISON:  12/19/2015 FINDINGS: Bilateral pulmonary infiltrates again seen with greatest consolidation in the left upper lobe. Small bilateral pleural effusions again noted. These findings show no significant interval change. Heart size remains stable. IMPRESSION: No significant change in bilateral infiltrates with maximal consolidation involving the left upper lobe. Small bilateral pleural effusions also without significant change. Electronically Signed   By: Myles Rosenthal M.D.   On: 12/22/2015 08:44   US Renal  12/21/2015  CLINICAL DATA:   60 year old male with acute renal injury. Hypertension. Initial encounter. EXAM: RENAL / URINARY TRACT ULTRASOUND COMPLETE COMPARISON:  Chest radiographs 12/19/2015 FINDINGS: Right Kidney: Length: 11.3 cm. Echogenicity within normal limits. No mass or hydronephrosis visualized. Left Kidney: Length: 11.6 cm. Left kidney less well visualized Echogenicity within normal limits. No mass or hydronephrosis visualized. Bladder: Appears normal for degree of bladder distention. Other findings: Bilateral pleural effusions re- demonstrated. IMPRESSION: 1. Normal sonographic appearance of the kidneys and bladder. 2. Bilateral pleural effusions. Electronically Signed   By: Odessa Fleming M.D.   On: 12/21/2015 12:28   Mr Foot Right Wo Contrast  12/22/2015  CLINICAL DATA:  Diabetic ulcer of the plantar aspect of the right foot at the third through fifth digits. EXAM: MRI OF THE RIGHT FOREFOOT WITHOUT CONTRAST TECHNIQUE: Multiplanar,  multisequence MR imaging was performed. No intravenous contrast was administered. COMPARISON:  None. FINDINGS: There is osteomyelitis in the third metatarsal and of the base of the proximal phalanx of the third toe. There is a pathologic fracture shaft of the third metatarsal. There is subperiosteal pus along the distal third metatarsal. The third metatarsal phalangeal joint is infected and there is dorsal dislocation of the proximal phalanx of the third toe. The pus extends to the plantar aspect of the ball of the foot communicating with the soft tissue ulcer. There is a 6 mm area of low signal at the dorsal aspect of the distal fourth metatarsal in the subcutaneous soft tissues which could represent a foreign body or gas in the soft tissues. There is pus surrounding this abnormality, probably arising from the third metatarsal phalangeal joint There is slight edema in the distal fourth metatarsal which is most likely reactive. There is nonspecific subcutaneous edema of the distal forefoot. Soft tissue  ulceration of the plantar aspect of the ball of the foot extends from just lateral to the head of the second metatarsal to the fifth metatarsal phalangeal joint. IMPRESSION: Osteomyelitis of the entire third metatarsal as well as the proximal phalanx of the third toe with a septic third metatarsal phalangeal joint with pus extending dorsally and to the plantar surface of the ball of the foot at the site of the ulcer. Electronically Signed   By: Francene Boyers M.D.   On: 12/22/2015 08:53   Dg Foot Complete Right  12/22/2015  CLINICAL DATA:  Osteomyelitis ball of rt foot. Pt had a callus that he picked and it got infected. He is diabetic. EXAM: RIGHT FOOT COMPLETE - 3+ VIEW COMPARISON:  12/22/2015 FINDINGS: Loss of cortex involves the third metatarsal, with thick circumferential periosteal reaction involving the metatarsal shaft and head. There is loss of cortex and underlying bone involving most of the proximal phalanx as well. There is air in the soft tissues projecting over the third and fourth metatarsals. IMPRESSION: Extensive osteomyelitis involving the third metatarsal and proximal phalanx. Air in the soft tissues consistent with infection. Electronically Signed   By: Esperanza Heir M.D.   On: 12/22/2015 15:21   Assessment: 1.  Osteomyelitis of the third ray, right foot. 2.  Diabetes mellitus with peripheral neuropathy.  Plan: The clinical and radiographic findings were explained to the patient and his wife.  He will require at least a partial third ray amputation of the right foot.  I explained to him that there is concern for changes of the right fourth metatarsal.  The radiologist noted that this is most likely reactive.  If a partial third ray amputation is performed and it does not heal then a more proximal amputation may be required.  This has been explained to him and he is aware of the potential risk.  Surgery is tentatively planned for Wednesday.  A noninvasive arterial study was ordered.   May require evaluation by vascular service based on the results of the study.  A culture of the right foot wound was performed.  A dry dressing was applied to the right foot.  Thank you for this consultation and allowing me to participate in the care of this patient.  Keyundra Fant IVAN 12/22/2015, 6:03 PM

## 2015-12-22 NOTE — Progress Notes (Signed)
Pharmacy Antibiotic Note  Douglas Cannon is a 60 y.o. male admitted on 12/19/2015 with osteomyelitis.  Pharmacy has been consulted for vancomycin dosing.  Plan: 60 yo man with osteomyelitis to start vancomycin.  He is also on azithromycin and rocephin for CAP Vancomycin 1750 mg IV X 1 then 750 mg IV q12 hours  Height:  (177.8 cm) Weight: 194 lb 10.7 oz (88.3 kg) IBW/kg (Calculated) : 73  Temp (24hrs), Avg:97.3 F (36.3 C), Min:96.9 F (36.1 C), Max:98 F (36.7 C)   Recent Labs Lab 12/19/15 1311 12/19/15 1552 12/20/15 0445 12/21/15 0437 12/22/15 0432  WBC 16.0*  --  17.1* 29.4* 22.2*  CREATININE 2.04*  --  2.06* 2.09* 2.12*  LATICACIDVEN 1.9 2.1*  --   --   --     Estimated Creatinine Clearance: 42.5 mL/min (by C-G formula based on Cr of 2.12).    No Known Allergies  Antimicrobials this admission: Rocephin  2/3 >>  Azithromycin 2/3 >>  Vancomycin 2/6>>  Microbiology results: 2/3  BCx: ngtd 2/3 MRSA PCR: (-)  Thank you for allowing pharmacy to be a part of this patient's care.  Talbert Cage Poteet 12/22/2015 12:16 PM

## 2015-12-23 ENCOUNTER — Inpatient Hospital Stay (HOSPITAL_COMMUNITY): Payer: BLUE CROSS/BLUE SHIELD

## 2015-12-23 DIAGNOSIS — J9601 Acute respiratory failure with hypoxia: Secondary | ICD-10-CM

## 2015-12-23 DIAGNOSIS — N179 Acute kidney failure, unspecified: Secondary | ICD-10-CM

## 2015-12-23 DIAGNOSIS — J189 Pneumonia, unspecified organism: Principal | ICD-10-CM

## 2015-12-23 DIAGNOSIS — E119 Type 2 diabetes mellitus without complications: Secondary | ICD-10-CM

## 2015-12-23 DIAGNOSIS — I5031 Acute diastolic (congestive) heart failure: Secondary | ICD-10-CM | POA: Diagnosis present

## 2015-12-23 DIAGNOSIS — M869 Osteomyelitis, unspecified: Secondary | ICD-10-CM

## 2015-12-23 LAB — GLUCOSE, CAPILLARY
Glucose-Capillary: 130 mg/dL — ABNORMAL HIGH (ref 65–99)
Glucose-Capillary: 225 mg/dL — ABNORMAL HIGH (ref 65–99)
Glucose-Capillary: 139 mg/dL — ABNORMAL HIGH (ref 65–99)
Glucose-Capillary: 178 mg/dL — ABNORMAL HIGH (ref 65–99)

## 2015-12-23 LAB — BASIC METABOLIC PANEL
Anion gap: 9 (ref 5–15)
BUN: 69 mg/dL — ABNORMAL HIGH (ref 6–20)
CO2: 30 mmol/L (ref 22–32)
Calcium: 8.7 mg/dL — ABNORMAL LOW (ref 8.9–10.3)
Chloride: 103 mmol/L (ref 101–111)
Creatinine, Ser: 1.91 mg/dL — ABNORMAL HIGH (ref 0.61–1.24)
GFR calc Af Amer: 43 mL/min — ABNORMAL LOW (ref >60)
GFR calc non Af Amer: 37 mL/min — ABNORMAL LOW (ref >60)
Glucose, Bld: 148 mg/dL — ABNORMAL HIGH (ref 65–99)
Potassium: 3.9 mmol/L (ref 3.5–5.1)
Sodium: 142 mmol/L (ref 135–145)

## 2015-12-23 LAB — CBC
HCT: 28.9 % — ABNORMAL LOW (ref 39.0–52.0)
Hemoglobin: 9.5 g/dL — ABNORMAL LOW (ref 13.0–17.0)
MCH: 28.8 pg (ref 26.0–34.0)
MCHC: 32.9 g/dL (ref 30.0–36.0)
MCV: 87.6 fL (ref 78.0–100.0)
Platelets: 297 10*3/uL (ref 150–400)
RBC: 3.3 MIL/uL — ABNORMAL LOW (ref 4.22–5.81)
RDW: 16.1 % — ABNORMAL HIGH (ref 11.5–15.5)
WBC: 14.4 10*3/uL — ABNORMAL HIGH (ref 4.0–10.5)

## 2015-12-23 LAB — HEMOGLOBIN A1C
Hgb A1c MFr Bld: 8.2 % — ABNORMAL HIGH (ref 4.8–5.6)
Mean Plasma Glucose: 189 mg/dL

## 2015-12-23 MED ORDER — DIPHENHYDRAMINE HCL 25 MG PO CAPS
50.0000 mg | ORAL_CAPSULE | Freq: Once | ORAL | Status: AC
  Administered 2015-12-23: 50 mg via ORAL
  Filled 2015-12-23: qty 2

## 2015-12-23 NOTE — Progress Notes (Signed)
Podiatry Progress Note  Subjective Douglas Cannon is scheduled for partial third ray amputation tomorrow.  He had a noninvasive arterial study performed earlier today.  He denies any nausea, vomiting, fever or chills.  Objective Vital signs in last 24 hours:   Temp:  [97 F (36.1 C)-97.8 F (36.6 C)] 97.6 F (36.4 C) (02/07 1300) Pulse Rate:  [71-81] 71 (02/07 1300) Resp:  [20] 20 (02/07 1300) BP: (114-126)/(73-80) 126/73 mmHg (02/07 1300) SpO2:  [94 %-96 %] 96 % (02/07 1300) Weight:  [188 lb 12.8 oz (85.639 kg)] 188 lb 12.8 oz (85.639 kg) (02/07 0448)  DRESSING: Intact with strikethrough. INTEGUMENT: Skin is warm and atrophic bilaterally. Erythema of the right foot is improved.  There is a full thickness ulceration along the plantar aspect of the right third metatarsal head.  The ulceration probes to bone. VASCULAR: Dorsalis pedis pulse is 1/4 bilaterally. Posterior tibial pulse is nonpalpable bilaterally. There is edema of both lower extremities (right > left).  Lab/Test Results  Recent Labs  12/22/15 0432 12/23/15 0601  WBC 22.2* 14.4*  HGB 9.5* 9.5*  HCT 29.1* 28.9*  PLT 335 297  NA 141 142  K 3.7 3.9  CL 104 103  CO2 27 30  BUN 71* 69*  CREATININE 2.12* 1.91*  GLUCOSE 119* 148*  CALCIUM 8.5* 8.7*    Recent Results (from the past 240 hour(s))  Culture, blood (routine x 2)     Status: None (Preliminary result)   Collection Time: 12/19/15  1:11 PM  Result Value Ref Range Status   Specimen Description BLOOD RIGHT ARM  Final   Special Requests BOTTLES DRAWN AEROBIC AND ANAEROBIC 8 CC EACH  Final   Culture NO GROWTH 4 DAYS  Final   Report Status PENDING  Incomplete  Culture, blood (routine x 2)     Status: None (Preliminary result)   Collection Time: 12/19/15  1:15 PM  Result Value Ref Range Status   Specimen Description BLOOD LEFT ARM  Final   Special Requests BOTTLES DRAWN AEROBIC AND ANAEROBIC 9CC EACH  Final   Culture NO GROWTH 4 DAYS  Final   Report  Status PENDING  Incomplete  MRSA PCR Screening     Status: None   Collection Time: 12/19/15  9:50 PM  Result Value Ref Range Status   MRSA by PCR NEGATIVE NEGATIVE Final    Comment:        The GeneXpert MRSA Assay (FDA approved for NASAL specimens only), is one component of a comprehensive MRSA colonization surveillance program. It is not intended to diagnose MRSA infection nor to guide or monitor treatment for MRSA infections.   Wound culture     Status: None (Preliminary result)   Collection Time: 12/22/15  6:30 PM  Result Value Ref Range Status   Specimen Description WOUND RIGHT FOOT  Final   Special Requests NONE  Final   Gram Stain   Final    ABUNDANT WBC PRESENT, PREDOMINANTLY PMN NO SQUAMOUS EPITHELIAL CELLS SEEN FEW GRAM POSITIVE COCCI IN PAIRS Performed at [facility]    Culture PENDING  Incomplete   Report Status PENDING  Incomplete     Dg Chest 2 View  12/22/2015  CLINICAL DATA:  Community acquired pneumonia.  Acute renal injury. EXAM: CHEST  2 VIEW COMPARISON:  12/19/2015 FINDINGS: Bilateral pulmonary infiltrates again seen with greatest consolidation in the left upper lobe. Small bilateral pleural effusions again noted. These findings show no significant interval change. Heart size remains stable. IMPRESSION: No  significant change in bilateral infiltrates with maximal consolidation involving the left upper lobe. Small bilateral pleural effusions also without significant change. Electronically Signed   By: John Myles Rosenthal.D.   On: 12/22/2015 08:44   Mr Foot Right Wo Contrast  12/22/2015  CLINICAL DATA:  Diabetic ulcer of the plantar aspect of the right foot at the third through fifth digits. EXAM: MRI OF THE RIGHT FOREFOOT WITHOUT CONTRAST TECHNIQUE: Multiplanar, multisequence MR imaging was performed. No intravenous contrast was administered. COMPARISON:  None. FINDINGS: There is osteomyelitis in the third metatarsal and of the base of the proximal phalanx of the  third toe. There is a pathologic fracture shaft of the third metatarsal. There is subperiosteal pus along the distal third metatarsal. The third metatarsal phalangeal joint is infected and there is dorsal dislocation of the proximal phalanx of the third toe. The pus extends to the plantar aspect of the ball of the foot communicating with the soft tissue ulcer. There is a 6 mm area of low signal at the dorsal aspect of the distal fourth metatarsal in the subcutaneous soft tissues which could represent a foreign body or gas in the soft tissues. There is pus surrounding this abnormality, probably arising from the third metatarsal phalangeal joint There is slight edema in the distal fourth metatarsal which is most likely reactive. There is nonspecific subcutaneous edema of the distal forefoot. Soft tissue ulceration of the plantar aspect of the ball of the foot extends from just lateral to the head of the second metatarsal to the fifth metatarsal phalangeal joint. IMPRESSION: Osteomyelitis of the entire third metatarsal as well as the proximal phalanx of the third toe with a septic third metatarsal phalangeal joint with pus extending dorsally and to the plantar surface of the ball of the foot at the site of the ulcer. Electronically Signed   By: Francene Boyers M.D.   On: 12/22/2015 08:53   US Arterial Seg Multiple  12/23/2015  CLINICAL DATA:  60 year old male with a history of right foot ulcer. Cardiovascular risk factors including hypertension, diabetes EXAM: NONINVASIVE PHYSIOLOGIC VASCULAR STUDY OF BILATERAL LOWER EXTREMITIES TECHNIQUE: Evaluation of both lower extremities was performed at rest, including calculation of ankle-brachial indices, multiple segmental pressure evaluation, segmental Doppler and segmental pulse volume recording. COMPARISON:  None. FINDINGS: Right: Resting ankle brachial index:  1.22 Segmental blood pressure: Upper extremity brachial pressure is relatively symmetric. Appropriate increased  to the leg. Significant decrease pressure from high thigh to low thigh. Digital pressure measures 119 systolic. Doppler: Segmental Doppler of the right lower extremity demonstrates triphasic waveform of the common femoral arteries superficial femoral artery and popliteal artery. Deterioration of the waveform of the tibial vessels with monophasic waveform of the posterior tibial artery and dorsalis pedis. Pulse volume recording: Segmental PVR of the right lower extremity demonstrates maintained waveform of the thigh, with loss of augmentation from high thigh to below knee. Waveform maintained at the ankle metatarsal and digital segment. Left: Resting ankle brachial index: 1.27 Segmental blood pressure: Symmetric brachial pressures to the right upper extremity. Appropriate increase to the lower extremity with no significant drop from segment a segment. Doppler: Segmental Doppler of the left lower extremity demonstrates triphasic waveform of the common femoral artery and superficial femoral artery. Triphasic waveform of the popliteal artery, posterior tibial artery, and dorsalis pedis. Pulse volume recording: Segmental PVR of the left lower extremity demonstrates maintained waveform of the thigh. Loss of augmentation from high thigh to below knee. Waveform maintained at the ankle, metatarsal  segment and digital segment. IMPRESSION: Right: Although the resting ankle-brachial index is within normal limits, the remainder of the study is suggestive of significant tibial disease, and likely of femoral popliteal disease decreasing flow to the foot. Left: Noninvasive arterial exam demonstrates no evidence of significant occlusive disease. Signed, Yvone Neu. Loreta Ave, DO [facility] Electronically Signed   By: Gilmer Mor D.O.   On: 12/23/2015 17:31   Dg Foot Complete Right  12/22/2015  CLINICAL DATA:  Osteomyelitis ball of rt foot. Pt had a callus that he picked and  it got infected. He is diabetic. EXAM: RIGHT FOOT COMPLETE - 3+ VIEW COMPARISON:  12/22/2015 FINDINGS: Loss of cortex involves the third metatarsal, with thick circumferential periosteal reaction involving the metatarsal shaft and head. There is loss of cortex and underlying bone involving most of the proximal phalanx as well. There is air in the soft tissues projecting over the third and fourth metatarsals. IMPRESSION: Extensive osteomyelitis involving the third metatarsal and proximal phalanx. Air in the soft tissues consistent with infection. Electronically Signed   By: Esperanza Heir M.D.   On: 12/22/2015 15:21    Medications Scheduled Meds: . aspirin EC  81 mg Oral Daily  . azithromycin  500 mg Intravenous Q24H  . cefTRIAXone (ROCEPHIN)  IV  1 g Intravenous Q24H  . citalopram  20 mg Oral Daily  . enoxaparin (LOVENOX) injection  40 mg Subcutaneous Q24H  . furosemide  80 mg Intravenous Q8H  . guaiFENesin  1,200 mg Oral BID  . insulin aspart  0-20 Units Subcutaneous TID WC  . insulin aspart  0-5 Units Subcutaneous QHS  . metoprolol tartrate  25 mg Oral BID  . vancomycin  750 mg Intravenous Q12H   Continuous Infusions:  PRN Meds:.albuterol  Assessment: 1. Osteomyelitis of the third ray, right foot. 2. Diabetes mellitus with peripheral neuropathy.  Plan: Partial third ray amputation of the right foot is planned for tomorrow.  The benefits and risks of the procedure were explained.  A consent will be obtained.  NPO after midnight.  Hold Lovenox.  A dry dressing was applied to the right foot.  Trella Thurmond IVAN 12/23/2015, 5:50 PM

## 2015-12-23 NOTE — Progress Notes (Signed)
Patient would like benadryl to help him rest, made on call MD aware, will follow new orders received.

## 2015-12-23 NOTE — Progress Notes (Signed)
[facility] PROGRESS NOTE  Bart Ashford ZOX:[id number] DOB: Nov 07, 1956 DOA: 12/19/2015 PCP: No PCP Per Patient  Summary: 60 year old male with history of diabetes (on oral hypoglycemics), hypertension who recently relocated from [place], presented to the ED with one week history of worsening cough with shortness of breath. He was seen at an urgent care on 1/29 and was prescribed Levaquin for findings of bilateral infiltrates on chest x-ray with a diagnosis of community-acquired pneumonia. However he continued to have dyspnea worsened on exertion. In the ED, patient was found to have acute hypoxic respiratory failure with oxygen desaturation in the 70s with wheezing. He had 2+ pitting edema bilaterally. He was placed on BiPAP and admitted to step down unit.  Of note,  patient lived in [place] and moved here recently.  he reports being diagnosed of diabetes about 2 years back and has been on metformin. He was seeing an endocrinologist and was last seen about a year and a half back. He reports that he is not very regular with checking his blood glucose and taking his medications.    Assessment/Plan: Acute hypoxic respiratory failure with hypoxia. Suspect a combination of community acquired pneumonia and acute CHF. (Had BNP in 4000s and 2+ pitting edema bilaterally). On empiric Rocephin and azithromycin. Cultures negative. -On IV Lasix 60 mg every 8 hours (patient now off BiPAP and requiring up to 10 L high flow O2 via nasal cannula). Follow-up chest x-ray on 12/22/15 revealed no significant change in bilateral infiltrates.  Lasix was increased to 80 mg every 8 hours. Monitor strict I/O. I/0 -900 cc, but likely not accurate. -2-D echo surprisingly shows a normal EF of 55 and 60% with no wall motion abnormality. Has mild tricuspid regurgitation and elevated pulmonary artery pressure 43 mmHg. Possibly has acute diastolic dysfunction (which was not able to evaluate on 2-D echo) f -Added aspirin.  Metoprolol was added at 25 mg twice a day. Steroid therapy discontinued. -We'll order fasting lipid panel.  Community-acquired pneumonia Chest x-ray shows persistent consolidation. He continues to cough. Continue intravenous antibiotics.  Acute diastolic congestive heart failure Weight is down approximately 8 pounds since admission. Intake and output has not been reliably recorded; but they are negative. Continue intravenous Lasix for now. He will likely need to be set up with cardiology as an outpatient.  Acute versus acute on chronic kidney disease. Patient is unsure if this is a chronic issue. Hold HCTZ and ACE inhibitor. Monitor with diuresis. Renal ultrasound does not show any hydronephrosis..  Records from his endocrinologist office in [place] have been requested. -His creatinine is actually improving with diuresis.  Anemia Low normal iron panel. Check stool for occult blood. Patient will need referral to GI as outpatient. B12 and folate are normal. Is on B12 supplements at home.  Type 2  diabetes mellitus Holding metformin-linagliptin and Amaryl. On sliding scale coverage. Glucose reasonable on sliding-scale NovoLog. Will order hemoglobin A1c. Holding ACE inhibitor.  Essential hypertension Holding HCTZ and ACE inhibitor given abnormal renal function. Holding amlodipine given his severe leg swellings. Twice a day dosing with metoprolol was added. His blood pressure is stable and controlled.  Leukocytosis Possibly in the setting of steroid use/osteomyelitis. Steroid therapy discontinued as the patient has no further wheezing. -His white blood cell count is trending downward.   Osteomyelitis of right foot MRI of right foot indicates underlying osteomyelitis with pus. Vancomycin was added for treatment.  Podiatry consulted for further management. Dr. Loralie Champagne consult noted and appreciated. He plans a partial amputation  tomorrow.  Code Status: Full code Family communication:  Discussed with wife Disposition : Discharge when clinically appropriate   Consults: none   Procedures:  none  Antibiotics:  Rocephin and azithromycin since 2/3  HPI/Subjective: Feeling better. Feels shortness of breath is improving. Continues to cough. Feels edema in extremities is improving   Objective: Filed Vitals:   12/22/15 1926 12/23/15 0448  BP:  116/75  Pulse:  81  Temp: 97 F (36.1 C) 97.8 F (36.6 C)  Resp:  20    Intake/Output Summary (Last 24 hours) at 12/23/15 1306 Last data filed at 12/23/15 0800  Gross per 24 hour  Intake    540 ml  Output   2475 ml  Net  -1935 ml   Filed Weights   12/21/15 0500 12/22/15 0500 12/23/15 0448  Weight: 90 kg (198 lb 6.6 oz) 88.3 kg (194 lb 10.7 oz) 85.639 kg (188 lb 12.8 oz)    Exam:   General:  Middle aged male not in distress  HEENT: No pallor, moist mucosa,   Cardiovascular:Normal S1 and S2, no murmurs rub gallop  Chest: Bibasilar crackles, no rhonchi or wheeze  Abdomen: Nondistended, nontender, bowel sounds present  Musculoskeletal: Warm, 1+ pitting edema bilaterally, ulceration over planta aspect of rt foot ( 3rd toe with foul smell)  CNS: Alert and oriented  Labs reviewed: Basic Metabolic Panel:  Recent Labs Lab 12/19/15 1311 12/20/15 0445 12/21/15 0437 12/22/15 0432 12/23/15 0601  NA 140 136 139 141 142  K 4.5 4.6 4.1 3.7 3.9  CL 104 103 105 104 103  CO2 26 23 23 27 30   GLUCOSE 247* 399* 139* 119* 148*  BUN 51* 50* 66* 71* 69*  CREATININE 2.04* 2.06* 2.09* 2.12* 1.91*  CALCIUM 8.7* 8.0* 8.5* 8.5* 8.7*   Liver Function Tests: No results for input(s): AST, ALT, ALKPHOS, BILITOT, PROT, ALBUMIN in the last 168 hours. No results for input(s): LIPASE, AMYLASE in the last 168 hours. No results for input(s): AMMONIA in the last 168 hours. CBC:  Recent Labs Lab 12/19/15 1311 12/20/15 0445 12/21/15 0437 12/22/15 0432 12/23/15 0601  WBC 16.0* 17.1* 29.4* 22.2* 14.4*  NEUTROABS 12.9*   --   --   --   --   HGB 10.5* 9.6* 9.6* 9.5* 9.5*  HCT 32.7* 29.8* 29.9* 29.1* 28.9*  MCV 88.6 89.0 88.7 87.9 87.6  PLT 427* 390 362 335 297   Cardiac Enzymes: No results for input(s): CKTOTAL, CKMB, CKMBINDEX, TROPONINI in the last 168 hours. BNP (last 3 results)  Recent Labs  12/19/15 1311 12/21/15 0437  BNP 4067.0* 2564.0*    ProBNP (last 3 results) No results for input(s): PROBNP in the last 8760 hours.  CBG:  Recent Labs Lab 12/22/15 1128 12/22/15 1627 12/22/15 2035 12/23/15 0728 12/23/15 1113  GLUCAP 184* 218* 266* 130* 225*    Recent Results (from the past 240 hour(s))  Culture, blood (routine x 2)     Status: None (Preliminary result)   Collection Time: 12/19/15  1:11 PM  Result Value Ref Range Status   Specimen Description BLOOD RIGHT ARM  Final   Special Requests BOTTLES DRAWN AEROBIC AND ANAEROBIC 8 CC EACH  Final   Culture NO GROWTH 4 DAYS  Final   Report Status PENDING  Incomplete  Culture, blood (routine x 2)     Status: None (Preliminary result)   Collection Time: 12/19/15  1:15 PM  Result Value Ref Range Status   Specimen Description BLOOD LEFT ARM  Final  Special Requests BOTTLES DRAWN AEROBIC AND ANAEROBIC 9CC EACH  Final   Culture NO GROWTH 4 DAYS  Final   Report Status PENDING  Incomplete  MRSA PCR Screening     Status: None   Collection Time: 12/19/15  9:50 PM  Result Value Ref Range Status   MRSA by PCR NEGATIVE NEGATIVE Final    Comment:        The GeneXpert MRSA Assay (FDA approved for NASAL specimens only), is one component of a comprehensive MRSA colonization surveillance program. It is not intended to diagnose MRSA infection nor to guide or monitor treatment for MRSA infections.   Wound culture     Status: None (Preliminary result)   Collection Time: 12/22/15  6:30 PM  Result Value Ref Range Status   Specimen Description WOUND RIGHT FOOT  Final   Special Requests NONE  Final   Gram Stain   Final    ABUNDANT WBC PRESENT,  PREDOMINANTLY PMN NO SQUAMOUS EPITHELIAL CELLS SEEN FEW GRAM POSITIVE COCCI IN PAIRS Performed at [facility]    Culture PENDING  Incomplete   Report Status PENDING  Incomplete     Studies: Dg Chest 2 View  12/22/2015  CLINICAL DATA:  Community acquired pneumonia.  Acute renal injury. EXAM: CHEST  2 VIEW COMPARISON:  12/19/2015 FINDINGS: Bilateral pulmonary infiltrates again seen with greatest consolidation in the left upper lobe. Small bilateral pleural effusions again noted. These findings show no significant interval change. Heart size remains stable. IMPRESSION: No significant change in bilateral infiltrates with maximal consolidation involving the left upper lobe. Small bilateral pleural effusions also without significant change. Electronically Signed   By: Myles Rosenthal M.D.   On: 12/22/2015 08:44   Mr Foot Right Wo Contrast  12/22/2015  CLINICAL DATA:  Diabetic ulcer of the plantar aspect of the right foot at the third through fifth digits. EXAM: MRI OF THE RIGHT FOREFOOT WITHOUT CONTRAST TECHNIQUE: Multiplanar, multisequence MR imaging was performed. No intravenous contrast was administered. COMPARISON:  None. FINDINGS: There is osteomyelitis in the third metatarsal and of the base of the proximal phalanx of the third toe. There is a pathologic fracture shaft of the third metatarsal. There is subperiosteal pus along the distal third metatarsal. The third metatarsal phalangeal joint is infected and there is dorsal dislocation of the proximal phalanx of the third toe. The pus extends to the plantar aspect of the ball of the foot communicating with the soft tissue ulcer. There is a 6 mm area of low signal at the dorsal aspect of the distal fourth metatarsal in the subcutaneous soft tissues which could represent a foreign body or gas in the soft tissues. There is pus surrounding this abnormality, probably arising from the third metatarsal phalangeal joint There is slight edema in the distal  fourth metatarsal which is most likely reactive. There is nonspecific subcutaneous edema of the distal forefoot. Soft tissue ulceration of the plantar aspect of the ball of the foot extends from just lateral to the head of the second metatarsal to the fifth metatarsal phalangeal joint. IMPRESSION: Osteomyelitis of the entire third metatarsal as well as the proximal phalanx of the third toe with a septic third metatarsal phalangeal joint with pus extending dorsally and to the plantar surface of the ball of the foot at the site of the ulcer. Electronically Signed   By: Francene Boyers M.D.   On: 12/22/2015 08:53   Dg Foot Complete Right  12/22/2015  CLINICAL DATA:  Osteomyelitis ball of  rt foot. Pt had a callus that he picked and it got infected. He is diabetic. EXAM: RIGHT FOOT COMPLETE - 3+ VIEW COMPARISON:  12/22/2015 FINDINGS: Loss of cortex involves the third metatarsal, with thick circumferential periosteal reaction involving the metatarsal shaft and head. There is loss of cortex and underlying bone involving most of the proximal phalanx as well. There is air in the soft tissues projecting over the third and fourth metatarsals. IMPRESSION: Extensive osteomyelitis involving the third metatarsal and proximal phalanx. Air in the soft tissues consistent with infection. Electronically Signed   By: Esperanza Heir M.D.   On: 12/22/2015 15:21    Scheduled Meds: . aspirin EC  81 mg Oral Daily  . azithromycin  500 mg Intravenous Q24H  . cefTRIAXone (ROCEPHIN)  IV  1 g Intravenous Q24H  . citalopram  20 mg Oral Daily  . enoxaparin (LOVENOX) injection  40 mg Subcutaneous Q24H  . furosemide  80 mg Intravenous Q8H  . guaiFENesin  1,200 mg Oral BID  . insulin aspart  0-20 Units Subcutaneous TID WC  . insulin aspart  0-5 Units Subcutaneous QHS  . metoprolol tartrate  25 mg Oral BID  . vancomycin  750 mg Intravenous Q12H   Continuous Infusions:     Time spent: 35 minutes    Iziah Cates  [facility] Pager [phone] If 7PM-7AM, please contact night-coverage at [email or web address], password [facility] 12/23/2015, 1:06 PM  LOS: 4 days

## 2015-12-24 ENCOUNTER — Inpatient Hospital Stay (HOSPITAL_COMMUNITY): Payer: BLUE CROSS/BLUE SHIELD | Admitting: Anesthesiology

## 2015-12-24 ENCOUNTER — Encounter (HOSPITAL_COMMUNITY)
Admission: EM | Disposition: A | Discharge status: Home Health | Payer: Self-pay | Source: Home / Self Care | Attending: Internal Medicine

## 2015-12-24 ENCOUNTER — Inpatient Hospital Stay (HOSPITAL_COMMUNITY): Payer: BLUE CROSS/BLUE SHIELD

## 2015-12-24 ENCOUNTER — Encounter (HOSPITAL_COMMUNITY): Payer: Self-pay | Admitting: *Deleted

## 2015-12-24 HISTORY — PX: AMPUTATION: SHX166

## 2015-12-24 LAB — CULTURE, BLOOD (ROUTINE X 2)
Culture: NO GROWTH
Culture: NO GROWTH

## 2015-12-24 LAB — LIPID PANEL
Cholesterol: 117 mg/dL (ref 0–200)
HDL: 33 mg/dL — ABNORMAL LOW (ref >40)
LDL Cholesterol: 59 mg/dL (ref 0–99)
Total CHOL/HDL Ratio: 3.5 RATIO
Triglycerides: 124 mg/dL (ref <150)
VLDL: 25 mg/dL (ref 0–40)

## 2015-12-24 LAB — GLUCOSE, CAPILLARY
Glucose-Capillary: 131 mg/dL — ABNORMAL HIGH (ref 65–99)
Glucose-Capillary: 122 mg/dL — ABNORMAL HIGH (ref 65–99)
Glucose-Capillary: 116 mg/dL — ABNORMAL HIGH (ref 65–99)
Glucose-Capillary: 252 mg/dL — ABNORMAL HIGH (ref 65–99)
Glucose-Capillary: 233 mg/dL — ABNORMAL HIGH (ref 65–99)

## 2015-12-24 LAB — CBC
HCT: 31.7 % — ABNORMAL LOW (ref 39.0–52.0)
Hemoglobin: 10.2 g/dL — ABNORMAL LOW (ref 13.0–17.0)
MCH: 28.2 pg (ref 26.0–34.0)
MCHC: 32.2 g/dL (ref 30.0–36.0)
MCV: 87.6 fL (ref 78.0–100.0)
Platelets: 283 10*3/uL (ref 150–400)
RBC: 3.62 MIL/uL — ABNORMAL LOW (ref 4.22–5.81)
RDW: 15.5 % (ref 11.5–15.5)
WBC: 13.8 10*3/uL — ABNORMAL HIGH (ref 4.0–10.5)

## 2015-12-24 LAB — BASIC METABOLIC PANEL
Anion gap: 9 (ref 5–15)
BUN: 52 mg/dL — ABNORMAL HIGH (ref 6–20)
CO2: 34 mmol/L — ABNORMAL HIGH (ref 22–32)
Calcium: 8.7 mg/dL — ABNORMAL LOW (ref 8.9–10.3)
Chloride: 98 mmol/L — ABNORMAL LOW (ref 101–111)
Creatinine, Ser: 1.65 mg/dL — ABNORMAL HIGH (ref 0.61–1.24)
GFR calc Af Amer: 51 mL/min — ABNORMAL LOW (ref >60)
GFR calc non Af Amer: 44 mL/min — ABNORMAL LOW (ref >60)
Glucose, Bld: 142 mg/dL — ABNORMAL HIGH (ref 65–99)
Potassium: 3.6 mmol/L (ref 3.5–5.1)
Sodium: 141 mmol/L (ref 135–145)

## 2015-12-24 SURGERY — AMPUTATION, FOOT, RAY
Anesthesia: Monitor Anesthesia Care | Site: Foot | Laterality: Right

## 2015-12-24 MED ORDER — MIDAZOLAM HCL 2 MG/2ML IJ SOLN
INTRAMUSCULAR | Status: AC
  Filled 2015-12-24: qty 2

## 2015-12-24 MED ORDER — FENTANYL CITRATE (PF) 100 MCG/2ML IJ SOLN: 25.0000 ug | INTRAMUSCULAR | Status: DC | PRN

## 2015-12-24 MED ORDER — MIDAZOLAM HCL 2 MG/2ML IJ SOLN
1.0000 mg | INTRAMUSCULAR | Status: DC | PRN
  Administered 2015-12-24 (×2): 2 mg via INTRAVENOUS
  Filled 2015-12-24: qty 2

## 2015-12-24 MED ORDER — MIDAZOLAM HCL 5 MG/5ML IJ SOLN
INTRAMUSCULAR | Status: DC | PRN
  Administered 2015-12-24 (×2): 1 mg via INTRAVENOUS

## 2015-12-24 MED ORDER — FENTANYL CITRATE (PF) 100 MCG/2ML IJ SOLN
INTRAMUSCULAR | Status: AC
  Filled 2015-12-24: qty 2

## 2015-12-24 MED ORDER — SODIUM CHLORIDE 0.9% FLUSH
INTRAVENOUS | Status: AC
  Filled 2015-12-24: qty 10

## 2015-12-24 MED ORDER — 0.9 % SODIUM CHLORIDE (POUR BTL) OPTIME
TOPICAL | Status: DC | PRN
  Administered 2015-12-24: 1000 mL

## 2015-12-24 MED ORDER — PROPOFOL 500 MG/50ML IV EMUL
INTRAVENOUS | Status: DC | PRN
  Administered 2015-12-24: 35 ug/kg/min via INTRAVENOUS

## 2015-12-24 MED ORDER — BUPIVACAINE HCL (PF) 0.5 % IJ SOLN
INTRAMUSCULAR | Status: DC | PRN
  Administered 2015-12-24: 20 mL

## 2015-12-24 MED ORDER — BUPIVACAINE HCL (PF) 0.5 % IJ SOLN
INTRAMUSCULAR | Status: AC
  Filled 2015-12-24: qty 30

## 2015-12-24 MED ORDER — ONDANSETRON HCL 4 MG/2ML IJ SOLN: 4.0000 mg | Freq: Once | INTRAMUSCULAR | Status: DC | PRN

## 2015-12-24 MED ORDER — PROPOFOL 10 MG/ML IV BOLUS
INTRAVENOUS | Status: AC
  Filled 2015-12-24: qty 20

## 2015-12-24 MED ORDER — FENTANYL CITRATE (PF) 100 MCG/2ML IJ SOLN
25.0000 ug | INTRAMUSCULAR | Status: AC
  Administered 2015-12-24 (×2): 25 ug via INTRAVENOUS

## 2015-12-24 MED ORDER — LACTATED RINGERS IV SOLN
INTRAVENOUS | Status: DC
  Administered 2015-12-24: 11:00:00 via INTRAVENOUS

## 2015-12-24 MED ORDER — FENTANYL CITRATE (PF) 100 MCG/2ML IJ SOLN
INTRAMUSCULAR | Status: DC | PRN
  Administered 2015-12-24 (×2): 25 ug via INTRAVENOUS

## 2015-12-24 SURGICAL SUPPLY — 58 items
BAG HAMPER (MISCELLANEOUS) ×3 IMPLANT
BANDAGE COBAN STERILE 2 (GAUZE/BANDAGES/DRESSINGS) ×3 IMPLANT
BANDAGE ELASTIC 4 VELCRO NS (GAUZE/BANDAGES/DRESSINGS) ×3 IMPLANT
BANDAGE ESMARK 4X12 BL STRL LF (DISPOSABLE) ×2 IMPLANT
BENZOIN TINCTURE PRP APPL 2/3 (GAUZE/BANDAGES/DRESSINGS) ×3 IMPLANT
BLADE 15 SAFETY STRL DISP (BLADE) ×6 IMPLANT
BLADE AVERAGE 25X9 (BLADE) ×3 IMPLANT
BLADE SURG 15 STRL LF DISP TIS (BLADE) ×2 IMPLANT
BLADE SURG 15 STRL SS (BLADE) ×1
BNDG CONFORM 2 STRL LF (GAUZE/BANDAGES/DRESSINGS) ×3 IMPLANT
BNDG ESMARK 4X12 BLUE STRL LF (DISPOSABLE) ×3
BNDG GAUZE ELAST 4 BULKY (GAUZE/BANDAGES/DRESSINGS) ×3 IMPLANT
BOOT STEPPER DURA LG (SOFTGOODS) IMPLANT
BOOT STEPPER DURA MED (SOFTGOODS) IMPLANT
BOOT STEPPER DURA SM (SOFTGOODS) IMPLANT
BOOT STEPPER DURA XLG (SOFTGOODS) ×3 IMPLANT
CANISTER WOUND CARE 500ML ATS (WOUND CARE) IMPLANT
CLOTH BEACON ORANGE TIMEOUT ST (SAFETY) ×3 IMPLANT
COVER LIGHT HANDLE STERIS (MISCELLANEOUS) ×6 IMPLANT
CUFF TOURNIQUET SINGLE 18IN (TOURNIQUET CUFF) IMPLANT
DECANTER SPIKE VIAL GLASS SM (MISCELLANEOUS) ×3 IMPLANT
DRAPE OEC MINIVIEW 54X84 (DRAPES) ×3 IMPLANT
DRSG ADAPTIC 3X8 NADH LF (GAUZE/BANDAGES/DRESSINGS) ×3 IMPLANT
DRSG VAC ATS MED SENSATRAC (GAUZE/BANDAGES/DRESSINGS) IMPLANT
DRSG VAC ATS SM SENSATRAC (GAUZE/BANDAGES/DRESSINGS) IMPLANT
ELECT REM PT RETURN 9FT ADLT (ELECTROSURGICAL) ×3
ELECTRODE REM PT RTRN 9FT ADLT (ELECTROSURGICAL) ×2 IMPLANT
FORMALIN 10 PREFIL 120ML (MISCELLANEOUS) ×3 IMPLANT
GAUZE SPONGE 4X4 12PLY STRL (GAUZE/BANDAGES/DRESSINGS) ×3 IMPLANT
GLOVE BIO SURGEON STRL SZ7 (GLOVE) ×3 IMPLANT
GLOVE BIO SURGEON STRL SZ7.5 (GLOVE) ×3 IMPLANT
GLOVE BIOGEL PI IND STRL 7.0 (GLOVE) ×6 IMPLANT
GLOVE BIOGEL PI IND STRL 7.5 (GLOVE) ×2 IMPLANT
GLOVE BIOGEL PI INDICATOR 7.0 (GLOVE) ×3
GLOVE BIOGEL PI INDICATOR 7.5 (GLOVE) ×1
GLOVE ECLIPSE 6.5 STRL STRAW (GLOVE) ×3 IMPLANT
GOWN STRL REUS W/TWL LRG LVL3 (GOWN DISPOSABLE) ×9 IMPLANT
KIT ROOM TURNOVER AP CYSTO (KITS) ×3 IMPLANT
KIT ROOM TURNOVER APOR (KITS) ×3 IMPLANT
MANIFOLD NEPTUNE II (INSTRUMENTS) ×3 IMPLANT
MARKER SKIN DUAL TIP RULER LAB (MISCELLANEOUS) ×3 IMPLANT
NEEDLE HYPO 27GX1-1/4 (NEEDLE) ×6 IMPLANT
NS IRRIG 1000ML POUR BTL (IV SOLUTION) ×3 IMPLANT
PACK BASIC LIMB (CUSTOM PROCEDURE TRAY) ×3 IMPLANT
PAD ARMBOARD 7.5X6 YLW CONV (MISCELLANEOUS) ×3 IMPLANT
PAD TELFA 3X4 1S STER (GAUZE/BANDAGES/DRESSINGS) ×3 IMPLANT
RASP SM TEAR CROSS CUT (RASP) IMPLANT
SET BASIN LINEN APH (SET/KITS/TRAYS/PACK) ×3 IMPLANT
SOL PREP PROV IODINE SCRUB 4OZ (MISCELLANEOUS) ×3 IMPLANT
SPONGE LAP 18X18 X RAY DECT (DISPOSABLE) ×3 IMPLANT
STRIP CLOSURE SKIN 1/2X4 (GAUZE/BANDAGES/DRESSINGS) ×3 IMPLANT
SUT BONE WAX W31G (SUTURE) IMPLANT
SUT ETHILON 3 0 FSL (SUTURE) ×6 IMPLANT
SUT PROLENE 4 0 PS 2 18 (SUTURE) IMPLANT
SUT VIC AB 4-0 PS2 27 (SUTURE) IMPLANT
SWAB CULTURE LIQ STUART DBL (MISCELLANEOUS) ×6 IMPLANT
SYR CONTROL 10ML LL (SYRINGE) ×6 IMPLANT
TOWEL OR 17X26 4PK STRL BLUE (TOWEL DISPOSABLE) ×3 IMPLANT

## 2015-12-24 NOTE — Progress Notes (Signed)
[facility] PROGRESS NOTE  Douglas Cannon WUJ:[id number] DOB: [birth year] DOA: 12/19/2015 PCP: No PCP Per Patient  Assessment/Plan: 1. Acute hypoxic respiratory failure. Likely related to CAP and CHF. Will continue supplemental O2 and wean as tolerated. 2. CAP, revealed on CXR. BC show now growth to date. Will continue IV abx. He remains afebrile and his WBC is improving at 13.8 today.  3. Acute on chronic diastolic CHF. Continue IV lasix. His weight is down approximately 20lbs since admission. ECHO as below. Creatinine is improving with diuresis. Continue current treatments 4. AKI, possibly superimposed on CKD. Improving. Continue to hold HCTZ and ACE-I. Continue to monitor in the setting of diuresis. Renal US is negative. Renal function improving with diuresis 5. Anemia. No evidence of bleeding. Will continue to monitor however he will likely need outpatient follow up.  6. DM type 2, stable. Continue SSI. 7. Essential HTN, stable. Continue Metoprolol.  8. Osteomyelitis of the right foot. MRI indicates underlying osteomyelitis with pus. Continue IV abx. Podiatry has consulted and will perform a partial third ray amputation today.   Code Status: Full DVT prophylaxis: Lovenox - on hold pending surgical procedure Family Communication: discussed with wife at the bedside Disposition Plan: discharge home once improved   Consultants:   Podiatry  Procedures:   ECHO Left ventricle: The cavity size was normal. There was mild focal basal hypertrophy of the septum. Systolic function was normal. The estimated ejection fraction was in the range of 55% to 60%. Wall motion was normal; there were no regional wall motion abnormalities. The study is not technically sufficient to allow evaluation of LV diastolic function. - Mitral valve: There was mild regurgitation. - Right ventricle: The cavity size was normal. Wall thickness was normal. Systolic function was normal. - Tricuspid  valve: There was mild regurgitation. - Pulmonic valve: There was trivial regurgitation. - Pulmonary arteries: Systolic pressure was mildly increased. PA peak pressure: 43 mm Hg (S).  Antibiotics:  Rocephin 2/3>>  Azithromycin 2/3>>  Vancomycin 2/6>>  HPI/Subjective: Breathing is improving, still has occasional cough  Objective: Filed Vitals:   12/23/15 2120 12/24/15 0539  BP: 120/78 123/77  Pulse: 70 69  Temp: 98.3 F (36.8 C) 98.6 F (37 C)  Resp: 22     Intake/Output Summary (Last 24 hours) at 12/24/15 0709 Last data filed at 12/23/15 1800  Gross per 24 hour  Intake    720 ml  Output    800 ml  Net    -80 ml   Filed Weights   12/22/15 0500 12/23/15 0448 12/24/15 0539  Weight: 88.3 kg (194 lb 10.7 oz) 85.639 kg (188 lb 12.8 oz) 81.239 kg (179 lb 1.6 oz)    Exam:  General: NAD, looks comfortable Cardiovascular: RRR, S1, S2  Respiratory: clear bilaterally, No wheezing, rales or rhonchi Abdomen: soft, non tender, no distention , bowel sounds normal Musculoskeletal: right foot is wrapped in dressing, edema in LLE is improving   Data Reviewed: Basic Metabolic Panel:  Recent Labs Lab 12/19/15 1311 12/20/15 0445 12/21/15 0437 12/22/15 0432 12/23/15 0601  NA 140 136 139 141 142  K 4.5 4.6 4.1 3.7 3.9  CL 104 103 105 104 103  CO2 26 23 23 27 30   GLUCOSE 247* 399* 139* 119* 148*  BUN 51* 50* 66* 71* 69*  CREATININE 2.04* 2.06* 2.09* 2.12* 1.91*  CALCIUM 8.7* 8.0* 8.5* 8.5* 8.7*    CBC:  Recent Labs Lab 12/19/15 1311 12/20/15 0445 12/21/15 9562 12/22/15 0432 12/23/15 0601 12/24/15 0532  WBC 16.0* 17.1* 29.4* 22.2* 14.4* 13.8*  NEUTROABS 12.9*  --   --   --   --   --   HGB 10.5* 9.6* 9.6* 9.5* 9.5* 10.2*  HCT 32.7* 29.8* 29.9* 29.1* 28.9* 31.7*  MCV 88.6 89.0 88.7 87.9 87.6 87.6  PLT 427* 390 362 335 297 283    BNP (last 3 results)  Recent Labs  12/19/15 1311 12/21/15 0437  BNP 4067.0* 2564.0*    CBG:  Recent Labs Lab  12/22/15 2035 12/23/15 0728 12/23/15 1113 12/23/15 1731 12/23/15 2123  GLUCAP 266* 130* 225* 139* 178*    Recent Results (from the past 240 hour(s))  Culture, blood (routine x 2)     Status: None (Preliminary result)   Collection Time: 12/19/15  1:11 PM  Result Value Ref Range Status   Specimen Description BLOOD RIGHT ARM  Final   Special Requests BOTTLES DRAWN AEROBIC AND ANAEROBIC 8 CC EACH  Final   Culture NO GROWTH 4 DAYS  Final   Report Status PENDING  Incomplete  Culture, blood (routine x 2)     Status: None (Preliminary result)   Collection Time: 12/19/15  1:15 PM  Result Value Ref Range Status   Specimen Description BLOOD LEFT ARM  Final   Special Requests BOTTLES DRAWN AEROBIC AND ANAEROBIC 9CC EACH  Final   Culture NO GROWTH 4 DAYS  Final   Report Status PENDING  Incomplete  MRSA PCR Screening     Status: None   Collection Time: 12/19/15  9:50 PM  Result Value Ref Range Status   MRSA by PCR NEGATIVE NEGATIVE Final    Comment:        The GeneXpert MRSA Assay (FDA approved for NASAL specimens only), is one component of a comprehensive MRSA colonization surveillance program. It is not intended to diagnose MRSA infection nor to guide or monitor treatment for MRSA infections.   Wound culture     Status: None (Preliminary result)   Collection Time: 12/22/15  6:30 PM  Result Value Ref Range Status   Specimen Description WOUND RIGHT FOOT  Final   Special Requests NONE  Final   Gram Stain   Final    ABUNDANT WBC PRESENT, PREDOMINANTLY PMN NO SQUAMOUS EPITHELIAL CELLS SEEN FEW GRAM POSITIVE COCCI IN PAIRS Performed at [facility]    Culture PENDING  Incomplete   Report Status PENDING  Incomplete     Studies: Dg Chest 2 View  12/22/2015  CLINICAL DATA:  Community acquired pneumonia.  Acute renal injury. EXAM: CHEST  2 VIEW COMPARISON:  12/19/2015 FINDINGS: Bilateral pulmonary infiltrates again seen with greatest consolidation in the left upper lobe.  Small bilateral pleural effusions again noted. These findings show no significant interval change. Heart size remains stable. IMPRESSION: No significant change in bilateral infiltrates with maximal consolidation involving the left upper lobe. Small bilateral pleural effusions also without significant change. Electronically Signed   By: Myles Rosenthal M.D.   On: 12/22/2015 08:44   Mr Foot Right Wo Contrast  12/22/2015  CLINICAL DATA:  Diabetic ulcer of the plantar aspect of the right foot at the third through fifth digits. EXAM: MRI OF THE RIGHT FOREFOOT WITHOUT CONTRAST TECHNIQUE: Multiplanar, multisequence MR imaging was performed. No intravenous contrast was administered. COMPARISON:  None. FINDINGS: There is osteomyelitis in the third metatarsal and of the base of the proximal phalanx of the third toe. There is a pathologic fracture shaft of the third metatarsal. There is subperiosteal pus along the distal  third metatarsal. The third metatarsal phalangeal joint is infected and there is dorsal dislocation of the proximal phalanx of the third toe. The pus extends to the plantar aspect of the ball of the foot communicating with the soft tissue ulcer. There is a 6 mm area of low signal at the dorsal aspect of the distal fourth metatarsal in the subcutaneous soft tissues which could represent a foreign body or gas in the soft tissues. There is pus surrounding this abnormality, probably arising from the third metatarsal phalangeal joint There is slight edema in the distal fourth metatarsal which is most likely reactive. There is nonspecific subcutaneous edema of the distal forefoot. Soft tissue ulceration of the plantar aspect of the ball of the foot extends from just lateral to the head of the second metatarsal to the fifth metatarsal phalangeal joint. IMPRESSION: Osteomyelitis of the entire third metatarsal as well as the proximal phalanx of the third toe with a septic third metatarsal phalangeal joint with pus  extending dorsally and to the plantar surface of the ball of the foot at the site of the ulcer. Electronically Signed   By: Francene Boyers M.D.   On: 12/22/2015 08:53   US Arterial Seg Multiple  12/23/2015  CLINICAL DATA:  60 year old male with a history of right foot ulcer. Cardiovascular risk factors including hypertension, diabetes EXAM: NONINVASIVE PHYSIOLOGIC VASCULAR STUDY OF BILATERAL LOWER EXTREMITIES TECHNIQUE: Evaluation of both lower extremities was performed at rest, including calculation of ankle-brachial indices, multiple segmental pressure evaluation, segmental Doppler and segmental pulse volume recording. COMPARISON:  None. FINDINGS: Right: Resting ankle brachial index:  1.22 Segmental blood pressure: Upper extremity brachial pressure is relatively symmetric. Appropriate increased to the leg. Significant decrease pressure from high thigh to low thigh. Digital pressure measures 119 systolic. Doppler: Segmental Doppler of the right lower extremity demonstrates triphasic waveform of the common femoral arteries superficial femoral artery and popliteal artery. Deterioration of the waveform of the tibial vessels with monophasic waveform of the posterior tibial artery and dorsalis pedis. Pulse volume recording: Segmental PVR of the right lower extremity demonstrates maintained waveform of the thigh, with loss of augmentation from high thigh to below knee. Waveform maintained at the ankle metatarsal and digital segment. Left: Resting ankle brachial index: 1.27 Segmental blood pressure: Symmetric brachial pressures to the right upper extremity. Appropriate increase to the lower extremity with no significant drop from segment a segment. Doppler: Segmental Doppler of the left lower extremity demonstrates triphasic waveform of the common femoral artery and superficial femoral artery. Triphasic waveform of the popliteal artery, posterior tibial artery, and dorsalis pedis. Pulse volume recording: Segmental PVR  of the left lower extremity demonstrates maintained waveform of the thigh. Loss of augmentation from high thigh to below knee. Waveform maintained at the ankle, metatarsal segment and digital segment. IMPRESSION: Right: Although the resting ankle-brachial index is within normal limits, the remainder of the study is suggestive of significant tibial disease, and likely of femoral popliteal disease decreasing flow to the foot. Left: Noninvasive arterial exam demonstrates no evidence of significant occlusive disease. Signed, Yvone Neu. Loreta Ave, DO [facility] Radiology Electronically Signed   By: Gilmer Mor D.O.   On: 12/23/2015 17:31   Dg Foot Complete Right  12/22/2015  CLINICAL DATA:  Osteomyelitis ball of rt foot. Pt had a callus that he picked and it got infected. He is diabetic. EXAM: RIGHT FOOT COMPLETE - 3+ VIEW COMPARISON:  12/22/2015 FINDINGS: Loss of cortex involves the third metatarsal, with  thick circumferential periosteal reaction involving the metatarsal shaft and head. There is loss of cortex and underlying bone involving most of the proximal phalanx as well. There is air in the soft tissues projecting over the third and fourth metatarsals. IMPRESSION: Extensive osteomyelitis involving the third metatarsal and proximal phalanx. Air in the soft tissues consistent with infection. Electronically Signed   By: Esperanza Heir M.D.   On: 12/22/2015 15:21    Scheduled Meds: . aspirin EC  81 mg Oral Daily  . azithromycin  500 mg Intravenous Q24H  . cefTRIAXone (ROCEPHIN)  IV  1 g Intravenous Q24H  . citalopram  20 mg Oral Daily  . enoxaparin (LOVENOX) injection  40 mg Subcutaneous Q24H  . furosemide  80 mg Intravenous Q8H  . guaiFENesin  1,200 mg Oral BID  . insulin aspart  0-20 Units Subcutaneous TID WC  . insulin aspart  0-5 Units Subcutaneous QHS  . metoprolol tartrate  25 mg Oral BID  . vancomycin  750 mg Intravenous Q12H   Continuous  Infusions:   Active Problems:   CAP (community acquired pneumonia)   Diabetes mellitus without complication (HCC)   Hypertension   Acute renal injury (HCC)   Bronchospasm   Acute respiratory failure (HCC)   Osteomyelitis of right foot (HCC)   Acute diastolic heart failure (HCC)    Time spent:   [employer]. MD  [facility] Pager [phone]. If 7PM-7AM, please contact night-coverage at [email or web address], password [facility] 12/24/2015, 7:09 AM  LOS: 5 days

## 2015-12-24 NOTE — Anesthesia Postprocedure Evaluation (Signed)
Anesthesia Post Note  Patient: Douglas Cannon  Procedure(s) Performed: Procedure(s) (LRB): PARTIAL THIRD RAY AMPUTATION OF THE RIGHT FOOT (AMPUTATION OF THE THIRD TOE AND A PORTION OF THE THIRD METATARSAL) (Right)  Patient location during evaluation: PACU Anesthesia Type: MAC Level of consciousness: awake and alert, oriented and patient cooperative Pain management: pain level controlled Vital Signs Assessment: post-procedure vital signs reviewed and stable Respiratory status: spontaneous breathing and nonlabored ventilation Cardiovascular status: blood pressure returned to baseline Postop Assessment: no signs of nausea or vomiting Anesthetic complications: no    Last Vitals:  Filed Vitals:   12/24/15 1255 12/24/15 1300  BP: 114/69 125/73  Pulse:    Temp:    Resp:      Last Pain:  Filed Vitals:   12/24/15 1300  PainSc: 0-No pain                 Taariq Leitz J

## 2015-12-24 NOTE — Anesthesia Preprocedure Evaluation (Signed)
Anesthesia Evaluation  Patient identified by MRN, date of birth, ID band Patient awake    Reviewed: Allergy & Precautions, NPO status , Patient's Chart, lab work & pertinent test results  Airway Mallampati: II  TM Distance: >3 FB     Dental  (+) Teeth Intact, Dental Advisory Given   Pulmonary pneumonia, unresolved,    breath sounds clear to auscultation       Cardiovascular hypertension,  Rhythm:Regular Rate:Normal     Neuro/Psych    GI/Hepatic   Endo/Other  diabetes  Renal/GU Renal disease     Musculoskeletal   Abdominal   Peds  Hematology   Anesthesia Other Findings   Reproductive/Obstetrics                             Anesthesia Physical Anesthesia Plan  ASA: III  Anesthesia Plan: MAC   Post-op Pain Management:    Induction: Intravenous  Airway Management Planned: Simple Face Mask  Additional Equipment:   Intra-op Plan:   Post-operative Plan:   Informed Consent: I have reviewed the patients History and Physical, chart, labs and discussed the procedure including the risks, benefits and alternatives for the proposed anesthesia with the patient or authorized representative who has indicated his/her understanding and acceptance.     Plan Discussed with:   Anesthesia Plan Comments:         Anesthesia Quick Evaluation

## 2015-12-24 NOTE — Op Note (Signed)
OPERATIVE NOTE  DATE OF PROCEDURE 12/24/2015  SURGEON Dallas Schimke, [place]  OR STAFF Circulator: Horton Marshall, RN Relief Scrub: Lennox Pippins, RN Scrub Person: Illene Silver, CST RN First Assistant: Lizabeth Leyden, RN   PREOPERATIVE DIAGNOSIS 1.  Osteomyelitis of third ray, right foot 2.  Ulceration, right foot 3.  Diabetes mellitus with peripheral neuropathy  POSTOPERATIVE DIAGNOSIS Same  PROCEDURE Partial third ray amputation, right foot  ANESTHESIA Monitor Anesthesia Care   HEMOSTASIS Pneumatic ankle tourniquet set at 250 mmHg  ESTIMATED BLOOD LOSS Less than 25 mL  MATERIALS USED None  INJECTABLES 0.5% Marcaine plain  PATHOLOGY 1.  Anaerobic culture 2.  Anaerobic culture 3.  Third toe and third metatarsal for pathology 4.  Wafer of bone from third metatarsal for margins  COMPLICATIONS None  INDICATIONS:  Ulceration of the right foot with radiographic and MRI findings consistent with osteomyelitis.  DESCRIPTION OF THE PROCEDURE:  The patient was brought to the operating room and placed on the operative table in the supine position.  A pneumatic ankle tourniquet was applied to the operative extremity.  Following sedation, the surgical site was anesthetized with 0.5% Marcaine plain.  The foot was then prepped, scrubbed, and draped in the usual sterile technique.  The foot was elevated, exsanguinated and the pneumatic ankle tourniquet inflated to 250 mmHg.    Attention was directed to the third toe.  2 converging incisions were made circumferentially at the base of the third toe.  Dissection was continued deep down to the level of the third MPJ.  Fragmentation of the base of the proximal phalanx was noted consistent with osteomyelitis.  The third toe was disarticulated and passed from the operative field.  Deep aerobic and anaerobic culture were obtained.  It was sent for pathology.  The plantar ulceration was visualized through the surgical incision.  The  incision was continued dorsally over the third metatarsal.  Dissection was continued deep down to the level of the third metatarsal.  The distal aspect of the third metatarsal was gray and soft consistent with osteomyelitis.  Using a power bone saw an osteotomy was performed proximal to normal-appearing bone.  The distal third metatarsal bone was freed of all soft tissue attachments and sent to pathology.  The third toe and distal third metatarsal bone was submitted as one specimen.  A power bone saw was used to perform a second osteotomy proximal to the first osteotomy creating a wafer of bone.  This bone was sent to pathology for margins.  Hemostasis was achieved with electrocautery.  The surgical wound was irrigated with copious amounts of sterile irrigant.  The incision was closed with nylon.  A sterile compressive dressing was applied to the right foot.  The pneumatic ankle tourniquet was deflated and a prompt hyperemic response was noted to all remaining digits of the right foot.   The patient tolerated the procedure well.  The patient was then transferred to PACU with vital signs stable and vascular status intact to all toes of the operative foot.

## 2015-12-24 NOTE — Transfer of Care (Signed)
Immediate Anesthesia Transfer of Care Note  Patient: Douglas Cannon  Procedure(s) Performed: Procedure(s): PARTIAL THIRD RAY AMPUTATION OF THE RIGHT FOOT (AMPUTATION OF THE THIRD TOE AND A PORTION OF THE THIRD METATARSAL) (Right)  Patient Location: PACU  Anesthesia Type:MAC  Level of Consciousness: awake, alert , oriented and patient cooperative  Airway & Oxygen Therapy: Patient Spontanous Breathing and Patient connected to face mask oxygen  Post-op Assessment: Report given to RN, Post -op Vital signs reviewed and stable and Patient moving all extremities  Post vital signs: Reviewed and stable  Last Vitals:  Filed Vitals:   12/24/15 1255 12/24/15 1300  BP: 114/69 125/73  Pulse:    Temp:    Resp:      Complications: No apparent anesthesia complications

## 2015-12-24 NOTE — Brief Op Note (Signed)
BRIEF OPERATIVE NOTE  DATE OF PROCEDURE 12/24/2015  SURGEON Dallas Schimke, [place]  OR STAFF Circulator: Horton Marshall, RN Relief Scrub: Lennox Pippins, RN Scrub Person: Illene Silver, CST RN First Assistant: Lizabeth Leyden, RN   PREOPERATIVE DIAGNOSIS 1.  Osteomyelitis of third ray, right foot 2.  Ulceration, right foot 3.  Diabetes mellitus with peripheral neuropathy  POSTOPERATIVE DIAGNOSIS Same  PROCEDURE Partial third ray amputation, right foot  ANESTHESIA Monitor Anesthesia Care   HEMOSTASIS Pneumatic ankle tourniquet set at 250 mmHg  ESTIMATED BLOOD LOSS Less than 25 mL  MATERIALS USED None  INJECTABLES 0.5% Marcaine plain  PATHOLOGY 1.  Anaerobic culture 2.  Anaerobic culture 3.  Third toe and third metatarsal for pathology 4.  Wafer of bone from third metatarsal for margins  COMPLICATIONS None

## 2015-12-25 ENCOUNTER — Encounter (HOSPITAL_COMMUNITY): Payer: Self-pay | Admitting: Podiatry

## 2015-12-25 LAB — BASIC METABOLIC PANEL
Anion gap: 7 (ref 5–15)
BUN: 38 mg/dL — ABNORMAL HIGH (ref 6–20)
CO2: 38 mmol/L — ABNORMAL HIGH (ref 22–32)
Calcium: 8.4 mg/dL — ABNORMAL LOW (ref 8.9–10.3)
Chloride: 95 mmol/L — ABNORMAL LOW (ref 101–111)
Creatinine, Ser: 1.39 mg/dL — ABNORMAL HIGH (ref 0.61–1.24)
GFR calc Af Amer: >60 mL/min (ref >60)
GFR calc non Af Amer: 54 mL/min — ABNORMAL LOW (ref >60)
Glucose, Bld: 137 mg/dL — ABNORMAL HIGH (ref 65–99)
Potassium: 3.3 mmol/L — ABNORMAL LOW (ref 3.5–5.1)
Sodium: 140 mmol/L (ref 135–145)

## 2015-12-25 LAB — CBC
HCT: 31.2 % — ABNORMAL LOW (ref 39.0–52.0)
Hemoglobin: 10.1 g/dL — ABNORMAL LOW (ref 13.0–17.0)
MCH: 28.2 pg (ref 26.0–34.0)
MCHC: 32.4 g/dL (ref 30.0–36.0)
MCV: 87.2 fL (ref 78.0–100.0)
Platelets: 253 10*3/uL (ref 150–400)
RBC: 3.58 MIL/uL — ABNORMAL LOW (ref 4.22–5.81)
RDW: 15.2 % (ref 11.5–15.5)
WBC: 14.2 10*3/uL — ABNORMAL HIGH (ref 4.0–10.5)

## 2015-12-25 LAB — HEMOGLOBIN A1C
Hgb A1c MFr Bld: 7.7 % — ABNORMAL HIGH (ref 4.8–5.6)
Mean Plasma Glucose: 174 mg/dL

## 2015-12-25 LAB — GLUCOSE, CAPILLARY
Glucose-Capillary: 108 mg/dL — ABNORMAL HIGH (ref 65–99)
Glucose-Capillary: 133 mg/dL — ABNORMAL HIGH (ref 65–99)
Glucose-Capillary: 260 mg/dL — ABNORMAL HIGH (ref 65–99)
Glucose-Capillary: 146 mg/dL — ABNORMAL HIGH (ref 65–99)
Glucose-Capillary: 176 mg/dL — ABNORMAL HIGH (ref 65–99)

## 2015-12-25 LAB — WOUND CULTURE

## 2015-12-25 MED ORDER — AZITHROMYCIN 250 MG PO TABS
500.0000 mg | ORAL_TABLET | Freq: Every day | ORAL | Status: DC
  Administered 2015-12-25: 500 mg via ORAL
  Filled 2015-12-25: qty 2

## 2015-12-25 NOTE — Evaluation (Signed)
Physical Therapy Evaluation Patient Details Name: Douglas Cannon MRN: [id number] DOB: 06-20-1956 Today's Date: 12/25/2015   History of Present Illness  60yo white male who comes to [facility] after worsening SOB and coughing. Pt was treated for PNA after visit to urgent care on 1/29. After admission, pt foudn to have a chronic ulcer on R foot plantar surface postiitve for osteomyolitis, and underwent a partial 3rd met amputation on 2/8. At baseline pt is full yindep and works 14hr shifts locally.   Clinical Impression  Pt is received semirecumbent in bed upon entry, awake, alert, and willing to participate. No acute distress noted.  Pt is A&Ox3 and pleasant.  Pt reports zero falls in the last 6 months, generally good balance, and previously fully indep. Pt strength as screened by functional mobility assessment reveals moderate weakness in BUE that limits functional indep and activity tolerance during gait and stairs. SaO2 is at 84-85% as received on RA, but improves with sitting, and exertion, discussed with RN at length. Ambulation distance is limited at this time with RW but will be attempted at later date/time with [facility]. Patient presenting with impairment of strength, range of motion, balance, oxygen perfusion, and activity tolerance, limiting ability to perform ADL and mobility tasks at  baseline level of function. Patient will benefit from skilled intervention to address the above impairments and limitations, in order to restore to prior level of function, improve patient safety upon discharge, and to decrease falls risk.       Follow Up Recommendations Home health PT    Equipment Recommendations  Rolling walker with 5" wheels;Crutches    Recommendations for Other Services       Precautions / Restrictions Precautions Precautions: None Restrictions Weight Bearing Restrictions: Yes RLE Weight Bearing: Non weight bearing      Mobility  Bed Mobility Overal bed mobility: Independent                 Transfers Overall transfer level: Needs assistance   Transfers: Sit to/from Stand Sit to Stand: Supervision         General transfer comment: req verbal and visual cues for form, NWB observance, and safe RW use.  Ambulation/Gait Ambulation/Gait assistance: Supervision Ambulation Distance (Feet): 40 Feet Assistive device: Rolling walker (2 wheeled)       General Gait Details: 2-point, hop to gait, c R NWB; pt takes breaks frequently due to fatigue. Very limited foot clearance due to hop, with scuffing of floor.   Stairs Stairs: Yes Stairs assistance: Mod assist Stair Management: No rails;With walker Number of Stairs: 2 General stair comments: posterior approach; performed two; loss of stability on third attempt that requires physical assistance to recover. mostly limited by arms weakness.   Wheelchair Mobility    Modified Rankin (Stroke Patients Only)       Balance Overall balance assessment: Modified Independent;No apparent balance deficits (not formally assessed)                                           Pertinent Vitals/Pain Pain Assessment: 0-10 Pain Intervention(s): Monitored during session;Premedicated before session    [place] expects to be discharged to:: Private residence Living Arrangements: Spouse/significant other Available Help at Discharge: Family (wife and 2 sons) Type of Home: House Home Access: Stairs to enter Entrance Stairs-Rails: Can reach both Entrance Stairs-Number of Steps: 3 Home Layout: One level Home Equipment:  None      Prior Function Level of Independence: Independent               Hand Dominance   Dominant Hand: Right    Extremity/Trunk Assessment   Upper Extremity Assessment: Generalized weakness (BUE weakness is a major limiting factor in transfers, amb and stairs. )           Lower Extremity Assessment: Generalized weakness;Overall [facility] for tasks assessed       Cervical / Trunk Assessment: Normal  Communication      Cognition Arousal/Alertness: Awake/alert Behavior During Therapy: WFL for tasks assessed/performed Overall Cognitive Status: Within Functional Limits for tasks assessed                      General Comments      Exercises General Exercises - Lower Extremity Long Arc Quad: Strengthening;Right;15 reps;Seated Hip Flexion/Marching: Strengthening;Right;15 reps;Seated      Assessment/Plan    PT Assessment Patient needs continued PT services  PT Diagnosis Difficulty walking;Abnormality of gait;Generalized weakness   PT Problem List Decreased strength;Decreased activity tolerance;Decreased mobility  PT Treatment Interventions DME instruction;Gait training;Stair training;Functional mobility training;Therapeutic activities;Therapeutic exercise;Balance training;Patient/family education   PT Goals (Current goals can be found in the Care Plan section) Acute Rehab PT Goals Patient Stated Goal: return to home and recover PT Goal Formulation: With patient Time For Goal Achievement: 01/08/16 Potential to Achieve Goals: Good    Frequency 7X/week   Barriers to discharge   stairs to enter    Co-evaluation               End of Session Equipment Utilized During Treatment: Gait belt Activity Tolerance: Patient tolerated treatment well;Patient limited by fatigue Patient left: in chair;with call bell/phone within reach Nurse Communication: Mobility status;Weight bearing status;Other (comment) (SaO2 drop at rest. )         Time: 1010-1039 PT Time Calculation (min) (ACUTE ONLY): 29 min   Charges:   PT Evaluation $PT Eval Low Complexity: 1 Procedure PT Treatments $Gait Training: 8-22 mins $Therapeutic Exercise: 8-22 mins   PT G Codes:        12:37 PM, 23-Jan-2016 Etta Grandchild, PT, DPT PRN Physical Therapist at [place] License # [id number] [phone] (wireless)  [phone] (mobile)

## 2015-12-25 NOTE — Progress Notes (Signed)
Podiatry Progress Note  Subjective Douglas Cannon is S/P POD 1 following partial third ray amputation of the right foot.  He denies any nausea, vomiting, fever or chills.  Pain is well controlled.  Objective Vital signs in last 24 hours:   Temp:  [97 F (36.1 C)-99.1 F (37.3 C)] 99.1 F (37.3 C) (02/09 0431) Pulse Rate:  [58-75] 75 (02/09 1206) Resp:  [11-20] 20 (02/09 0431) BP: (102-133)/(55-87) 115/69 mmHg (02/09 0431) SpO2:  [84 %-100 %] 84 % (02/09 1206) Weight:  [174 lb 9.6 oz (79.198 kg)] 174 lb 9.6 oz (79.198 kg) (02/09 0431)  DRESSING: Intact with strikethrough. INTEGUMENT: Incision is well approximated with sutures in place.  Plantar ulceration of the right foot is granular.  VASCULAR: Edema is improved.    Lab/Test Results   Recent Labs  12/24/15 0532 12/25/15 0557  WBC 13.8* 14.2*  HGB 10.2* 10.1*  HCT 31.7* 31.2*  PLT 283 253  NA 141 140  K 3.6 3.3*  CL 98* 95*  CO2 34* 38*  BUN 52* 38*  CREATININE 1.65* 1.39*  GLUCOSE 142* 137*  CALCIUM 8.7* 8.4*    Recent Results (from the past 240 hour(s))  Culture, blood (routine x 2)     Status: None   Collection Time: 12/19/15  1:11 PM  Result Value Ref Range Status   Specimen Description BLOOD RIGHT ARM  Final   Special Requests BOTTLES DRAWN AEROBIC AND ANAEROBIC 8 CC EACH  Final   Culture NO GROWTH 5 DAYS  Final   Report Status 12/24/2015 FINAL  Final  Culture, blood (routine x 2)     Status: None   Collection Time: 12/19/15  1:15 PM  Result Value Ref Range Status   Specimen Description BLOOD LEFT ARM  Final   Special Requests BOTTLES DRAWN AEROBIC AND ANAEROBIC 9CC EACH  Final   Culture NO GROWTH 5 DAYS  Final   Report Status 12/24/2015 FINAL  Final  MRSA PCR Screening     Status: None   Collection Time: 12/19/15  9:50 PM  Result Value Ref Range Status   MRSA by PCR NEGATIVE NEGATIVE Final    Comment:        The GeneXpert MRSA Assay (FDA approved for NASAL specimens only), is one component of  a comprehensive MRSA colonization surveillance program. It is not intended to diagnose MRSA infection nor to guide or monitor treatment for MRSA infections.   Wound culture     Status: None   Collection Time: 12/22/15  6:30 PM  Result Value Ref Range Status   Specimen Description WOUND RIGHT FOOT  Final   Special Requests NONE  Final   Gram Stain   Final    ABUNDANT WBC PRESENT, PREDOMINANTLY PMN NO SQUAMOUS EPITHELIAL CELLS SEEN FEW GRAM POSITIVE COCCI IN PAIRS Performed at [facility]    Culture   Final    FEW STREPTOCOCCUS GROUP G Note: Beta hemolytic streptococci are predictably susceptible to penicillin and other beta lactams. Susceptibility testing not routinely performed. Performed at [facility]    Report Status 12/25/2015 FINAL  Final  Anaerobic culture     Status: None (Preliminary result)   Collection Time: 12/24/15  1:45 PM  Result Value Ref Range Status   Specimen Description WOUND RIGHT FOOT  Final   Special Requests ANCEF   Final   Gram Stain   Final    RARE WBC PRESENT, PREDOMINANTLY PMN NO SQUAMOUS EPITHELIAL CELLS SEEN NO ORGANISMS SEEN Performed at [facility]    Culture PENDING  Incomplete   Report Status PENDING  Incomplete  Wound culture     Status: None (Preliminary result)   Collection Time: 12/24/15  1:45 PM  Result Value Ref Range Status   Specimen Description WOUND RIGHT FOOT  Final   Special Requests ANCEF   Final   Gram Stain   Final    RARE WBC PRESENT, PREDOMINANTLY PMN NO SQUAMOUS EPITHELIAL CELLS SEEN FEW GRAM POSITIVE COCCI IN PAIRS Performed at [facility]    Culture NO GROWTH Performed at [facility]   Final   Report Status PENDING  Incomplete     US Arterial Seg Multiple  12/23/2015  CLINICAL DATA:  60 year old male with a history of right foot ulcer. Cardiovascular risk factors including hypertension, diabetes EXAM: NONINVASIVE PHYSIOLOGIC VASCULAR STUDY OF BILATERAL  LOWER EXTREMITIES TECHNIQUE: Evaluation of both lower extremities was performed at rest, including calculation of ankle-brachial indices, multiple segmental pressure evaluation, segmental Doppler and segmental pulse volume recording. COMPARISON:  None. FINDINGS: Right: Resting ankle brachial index:  1.22 Segmental blood pressure: Upper extremity brachial pressure is relatively symmetric. Appropriate increased to the leg. Significant decrease pressure from high thigh to low thigh. Digital pressure measures 119 systolic. Doppler: Segmental Doppler of the right lower extremity demonstrates triphasic waveform of the common femoral arteries superficial femoral artery and popliteal artery. Deterioration of the waveform of the tibial vessels with monophasic waveform of the posterior tibial artery and dorsalis pedis. Pulse volume recording: Segmental PVR of the right lower extremity demonstrates maintained waveform of the thigh, with loss of augmentation from high thigh to below knee. Waveform maintained at the ankle metatarsal and digital segment. Left: Resting ankle brachial index: 1.27 Segmental blood pressure: Symmetric brachial pressures to the right upper extremity. Appropriate increase to the lower extremity with no significant drop from segment a segment. Doppler: Segmental Doppler of the left lower extremity demonstrates triphasic waveform of the common femoral artery and superficial femoral artery. Triphasic waveform of the popliteal artery, posterior tibial artery, and dorsalis pedis. Pulse volume recording: Segmental PVR of the left lower extremity demonstrates maintained waveform of the thigh. Loss of augmentation from high thigh to below knee. Waveform maintained at the ankle, metatarsal segment and digital segment. IMPRESSION: Right: Although the resting ankle-brachial index is within normal limits, the remainder of the study is suggestive of significant tibial disease, and likely of femoral popliteal disease  decreasing flow to the foot. Left: Noninvasive arterial exam demonstrates no evidence of significant occlusive disease. Signed, Yvone Neu. Loreta Ave, DO [facility] Radiology Electronically Signed   By: Gilmer Mor D.O.   On: 12/23/2015 17:31   Dg Foot Complete Right  12/24/2015  CLINICAL DATA:  Postop amputation of the right third metatarsal bone. EXAM: RIGHT FOOT COMPLETE - 3+ VIEW COMPARISON:  Plain film of the right foot dated 12/22/2015. FINDINGS: Interval amputation of the right third metatarsal bone, amputated at the proximal shaft. No surgical complicating feature seen. Remainder of the osseous structures appear intact and normal in mineralization. No destructive osseous changes to suggest additional osteomyelitis. IMPRESSION: Amputation deformity of the right third metatarsal bone. No evidence of surgical complicating feature. Electronically Signed   By: Bary Richard M.D.   On: 12/24/2015 14:52    Medications Scheduled Meds: . aspirin EC  81 mg Oral Daily  . azithromycin  500 mg Oral q1800  . cefTRIAXone (ROCEPHIN)  IV  1 g Intravenous Q24H  . citalopram  20 mg Oral Daily  . enoxaparin (LOVENOX) injection  40 mg Subcutaneous Q24H  . furosemide  80 mg Intravenous Q8H  . guaiFENesin  1,200 mg Oral BID  . insulin aspart  0-20 Units Subcutaneous TID WC  . insulin aspart  0-5 Units Subcutaneous QHS  . metoprolol tartrate  25 mg Oral BID  . vancomycin  750 mg Intravenous Q12H   Continuous Infusions:  PRN Meds:.albuterol  Assessment: POD 1 S/P partial third ray amputation of the right foot.  Plan: 1.  Dressing changed.  2.  Continue antibiotics 3.  Will need 6 weeks of IV antibiotic given the MRI findings involving the fourth metatarsal. 4.  NWB on right foot 5.  Cont PT 6.  I will change dressing tomorrow around lunch.  I anticipate clearing him for discharge at that time.  Jery Hollern IVAN 12/25/2015, 3:14 PM

## 2015-12-25 NOTE — Progress Notes (Signed)
[facility] PROGRESS NOTE  Douglas Cannon WUJ:[id number] DOB: 15-May-1956 DOA: 12/19/2015 PCP: No PCP Per Patient  Assessment/Plan: 1. Acute hypoxic respiratory failure. Likely related to CAP and CHF. Continue supplemental O2 and wean as tolerated. 2. CAP, revealed on CXR. BC show now growth to date. Will continue IV abx. He remains afebrile and his WBC is stable today.  3. Acute on chronic diastolic CHF. Continue IV lasix. His weight is down approximately 24lbs since admission. ECHO as below. Creatinine continues to improve with diuresis. Continue current treatments 4. AKI, possibly superimposed on CKD. Improving. Continue to hold HCTZ and ACE-I. Continue to monitor in the setting of diuresis. Renal US is negative. Renal function improving with diuresis. 5. Anemia. No evidence of bleeding. Will continue to monitor however he will likely need outpatient follow up.  6. DM type 2, stable. Continue SSI. 7. Essential HTN, stable. Continue Metoprolol.  8. Osteomyelitis of the right foot. MRI indicates underlying osteomyelitis with pus. Continue IV abx. Podiatry has consulted and performed a partial third ray amputation 2/8. Follow up intraoperative cultures.  Code Status: Full DVT prophylaxis: Lovenox - on hold pending surgical procedure Family Communication: discussed with patient Disposition Plan: discharge home once improved   Consultants:   Podiatry  PT-Home health PT  Procedures:   ECHO Left ventricle: The cavity size was normal. There was mild focal basal hypertrophy of the septum. Systolic function was normal. The estimated ejection fraction was in the range of 55% to 60%. Wall motion was normal; there were no regional wall motion abnormalities. The study is not technically sufficient to allow evaluation of LV diastolic function. - Mitral valve: There was mild regurgitation. - Right ventricle: The cavity size was normal. Wall thickness was normal. Systolic  function was normal. - Tricuspid valve: There was mild regurgitation. - Pulmonic valve: There was trivial regurgitation. - Pulmonary arteries: Systolic pressure was mildly increased. PA peak pressure: 43 mm Hg (S).  Partial third ray amputation, right foot.   Antibiotics:  Rocephin 2/3>>  Azithromycin 2/3>>  Vancomycin 2/6>>  HPI/Subjective: No shortness of breath or chest pain. No new complaints  Objective: Filed Vitals:   12/24/15 2301 12/25/15 0431  BP: 121/62 115/69  Pulse: 75 68  Temp:  99.1 F (37.3 C)  Resp:  20    Intake/Output Summary (Last 24 hours) at 12/25/15 0836 Last data filed at 12/25/15 9562  Gross per 24 hour  Intake   1700 ml  Output   1850 ml  Net   -150 ml   Filed Weights   12/24/15 0539 12/24/15 1030 12/25/15 0431  Weight: 81.239 kg (179 lb 1.6 oz) 80.74 kg (178 lb) 79.198 kg (174 lb 9.6 oz)    Exam:  General: NAD, looks comfortable Cardiovascular: regular rate, S1, S2  Respiratory: CTA B Abdomen: soft, non tender, no distention , bowel sounds normal Musculoskeletal: right foot is wrapped in dressing, edema in LLE is improving   Data Reviewed: Basic Metabolic Panel:  Recent Labs Lab 12/21/15 0437 12/22/15 0432 12/23/15 0601 12/24/15 0532 12/25/15 0557  NA 139 141 142 141 140  K 4.1 3.7 3.9 3.6 3.3*  CL 105 104 103 98* 95*  CO2 34* 38*  GLUCOSE 139* 119* 148* 142* 137*  BUN 66* 71* 69* 52* 38*  CREATININE 2.09* 2.12* 1.91* 1.65* 1.39*  CALCIUM 8.5* 8.5* 8.7* 8.7* 8.4*    CBC:  Recent Labs Lab 12/19/15 1311  12/21/15 0437 12/22/15 0432 12/23/15 0601 12/24/15 0532 12/25/15 0557  WBC 16.0*  < > 29.4* 22.2* 14.4* 13.8* 14.2*  NEUTROABS 12.9*  --   --   --   --   --   --   HGB 10.5*  < > 9.6* 9.5* 9.5* 10.2* 10.1*  HCT 32.7*  < > 29.9* 29.1* 28.9* 31.7* 31.2*  MCV 88.6  < > 88.7 87.9 87.6 87.6 87.2  PLT 427*  < > 362 335 297 283 253  < > = values in this interval not displayed.  BNP (last 3  results)  Recent Labs  12/19/15 1311 12/21/15 0437  BNP 4067.0* 2564.0*    CBG:  Recent Labs Lab 12/24/15 1428 12/24/15 1611 12/24/15 2023 12/24/15 2302 12/25/15 0734  GLUCAP 108* 116* 252* 233* 133*    Recent Results (from the past 240 hour(s))  Culture, blood (routine x 2)     Status: None   Collection Time: 12/19/15  1:11 PM  Result Value Ref Range Status   Specimen Description BLOOD RIGHT ARM  Final   Special Requests BOTTLES DRAWN AEROBIC AND ANAEROBIC 8 CC EACH  Final   Culture NO GROWTH 5 DAYS  Final   Report Status 12/24/2015 FINAL  Final  Culture, blood (routine x 2)     Status: None   Collection Time: 12/19/15  1:15 PM  Result Value Ref Range Status   Specimen Description BLOOD LEFT ARM  Final   Special Requests BOTTLES DRAWN AEROBIC AND ANAEROBIC 9CC EACH  Final   Culture NO GROWTH 5 DAYS  Final   Report Status 12/24/2015 FINAL  Final  MRSA PCR Screening     Status: None   Collection Time: 12/19/15  9:50 PM  Result Value Ref Range Status   MRSA by PCR NEGATIVE NEGATIVE Final    Comment:        The GeneXpert MRSA Assay (FDA approved for NASAL specimens only), is one component of a comprehensive MRSA colonization surveillance program. It is not intended to diagnose MRSA infection nor to guide or monitor treatment for MRSA infections.   Wound culture     Status: None (Preliminary result)   Collection Time: 12/22/15  6:30 PM  Result Value Ref Range Status   Specimen Description WOUND RIGHT FOOT  Final   Special Requests NONE  Final   Gram Stain   Final    ABUNDANT WBC PRESENT, PREDOMINANTLY PMN NO SQUAMOUS EPITHELIAL CELLS SEEN FEW GRAM POSITIVE COCCI IN PAIRS Performed at [facility]    Culture   Final    Culture reincubated for better growth Performed at [facility]    Report Status PENDING  Incomplete  Wound culture     Status: None (Preliminary result)   Collection Time: 12/24/15  1:45 PM  Result Value Ref Range Status    Specimen Description WOUND RIGHT FOOT  Final   Special Requests ANCEF 750mg   Final   Gram Stain PENDING  Incomplete   Culture NO GROWTH Performed at [facility]   Final   Report Status PENDING  Incomplete     Studies: US Arterial Seg Multiple  12/23/2015  CLINICAL DATA:  60 year old male with a history of right foot ulcer. Cardiovascular risk factors including hypertension, diabetes EXAM: NONINVASIVE PHYSIOLOGIC VASCULAR STUDY OF BILATERAL LOWER EXTREMITIES TECHNIQUE: Evaluation of both lower extremities was performed at rest, including calculation of ankle-brachial indices, multiple segmental pressure evaluation, segmental Doppler and segmental pulse volume recording. COMPARISON:  None. FINDINGS: Right: Resting ankle brachial index:  1.22 Segmental blood pressure: Upper  extremity brachial pressure is relatively symmetric. Appropriate increased to the leg. Significant decrease pressure from high thigh to low thigh. Digital pressure measures 119 systolic. Doppler: Segmental Doppler of the right lower extremity demonstrates triphasic waveform of the common femoral arteries superficial femoral artery and popliteal artery. Deterioration of the waveform of the tibial vessels with monophasic waveform of the posterior tibial artery and dorsalis pedis. Pulse volume recording: Segmental PVR of the right lower extremity demonstrates maintained waveform of the thigh, with loss of augmentation from high thigh to below knee. Waveform maintained at the ankle metatarsal and digital segment. Left: Resting ankle brachial index: 1.27 Segmental blood pressure: Symmetric brachial pressures to the right upper extremity. Appropriate increase to the lower extremity with no significant drop from segment a segment. Doppler: Segmental Doppler of the left lower extremity demonstrates triphasic waveform of the common femoral artery and superficial femoral artery. Triphasic waveform of the popliteal artery, posterior  tibial artery, and dorsalis pedis. Pulse volume recording: Segmental PVR of the left lower extremity demonstrates maintained waveform of the thigh. Loss of augmentation from high thigh to below knee. Waveform maintained at the ankle, metatarsal segment and digital segment. IMPRESSION: Right: Although the resting ankle-brachial index is within normal limits, the remainder of the study is suggestive of significant tibial disease, and likely of femoral popliteal disease decreasing flow to the foot. Left: Noninvasive arterial exam demonstrates no evidence of significant occlusive disease. Signed, Yvone Neu. Loreta Ave, DO [facility] Radiology Electronically Signed   By: Gilmer Mor D.O.   On: 12/23/2015 17:31   Dg Foot Complete Right  12/24/2015  CLINICAL DATA:  Postop amputation of the right third metatarsal bone. EXAM: RIGHT FOOT COMPLETE - 3+ VIEW COMPARISON:  Plain film of the right foot dated 12/22/2015. FINDINGS: Interval amputation of the right third metatarsal bone, amputated at the proximal shaft. No surgical complicating feature seen. Remainder of the osseous structures appear intact and normal in mineralization. No destructive osseous changes to suggest additional osteomyelitis. IMPRESSION: Amputation deformity of the right third metatarsal bone. No evidence of surgical complicating feature. Electronically Signed   By: Bary Richard M.D.   On: 12/24/2015 14:52    Scheduled Meds: . aspirin EC  81 mg Oral Daily  . azithromycin  500 mg Intravenous Q24H  . cefTRIAXone (ROCEPHIN)  IV  1 g Intravenous Q24H  . citalopram  20 mg Oral Daily  . enoxaparin (LOVENOX) injection  40 mg Subcutaneous Q24H  . furosemide  80 mg Intravenous Q8H  . guaiFENesin  1,200 mg Oral BID  . insulin aspart  0-20 Units Subcutaneous TID WC  . insulin aspart  0-5 Units Subcutaneous QHS  . metoprolol tartrate  25 mg Oral BID  . vancomycin  750 mg Intravenous Q12H   Continuous  Infusions:   Active Problems:   CAP (community acquired pneumonia)   Diabetes mellitus without complication (HCC)   Hypertension   Acute renal injury (HCC)   Bronchospasm   Acute respiratory failure (HCC)   Osteomyelitis of right foot (HCC)   Acute diastolic heart failure (HCC)    Time spent:   [employer]. MD  [facility] Pager [phone]. If 7PM-7AM, please contact night-coverage at [email or web address], password [facility] 12/25/2015, 8:36 AM  LOS: 6 days     By signing my name below, I, Zadie Cleverly, attest that this documentation has been prepared under the direction and in the presence of Erick Blinks, MD. Electronically signed: Zadie Cleverly, Scribe. 12/25/2015

## 2015-12-25 NOTE — Plan of Care (Signed)
Problem: Acute Rehab PT Goals(only PT should resolve) Goal: Patient Will Transfer Sit To/From Stand Pt will transfer sit to/from-stand with RW at ModI without loss-of-balance to demonstrate good safety awareness for independent mobility in home.     Goal: Pt Will Ambulate Pt will ambulate with LRAD at Supervision using R NWB for a distance greater than 2101ft to demonstrate the ability to perform safe household distance ambulation at discharge.    Goal: Pt Will Go Up/Down Stairs Pt will ascend/descend 5 stairs with LRAD and 1 HR at Supervision to demonstrate safe entry/exit of home.

## 2015-12-25 NOTE — Anesthesia Postprocedure Evaluation (Signed)
Anesthesia Post Note  Patient: Douglas Cannon  Procedure(s) Performed: Procedure(s) (LRB): PARTIAL THIRD RAY AMPUTATION OF THE RIGHT FOOT (AMPUTATION OF THE THIRD TOE AND A PORTION OF THE THIRD METATARSAL) (Right)  Patient location during evaluation: Nursing Unit Anesthesia Type: MAC Level of consciousness: awake and alert, oriented and patient cooperative Pain management: pain level controlled Vital Signs Assessment: post-procedure vital signs reviewed and stable Respiratory status: spontaneous breathing Cardiovascular status: blood pressure returned to baseline Postop Assessment: no signs of nausea or vomiting Anesthetic complications: no    Last Vitals:  Filed Vitals:   12/24/15 2301 12/25/15 0431  BP: 121/62 115/69  Pulse: 75 68  Temp:  37.3 C  Resp:  20    Last Pain:  Filed Vitals:   12/25/15 0434  PainSc: 0-No pain                 Brynnan Rodenbaugh J

## 2015-12-25 NOTE — Clinical Documentation Improvement (Signed)
Hospitalist  Please update your documentation within the medical record to reflect your response to this query. Thank you  Can the diagnosis of CKD be further specified?   CKD Stage I - GFR greater than or equal to 90  CKD Stage II - GFR 60-89  CKD Stage III - GFR 30-59  CKD Stage IV - GFR 15-29  CKD Stage V - GFR < 15  ESRD (End Stage Renal Disease)  Other condition  Unable to clinically determine  Supporting Information: (risk factors, signs and symptoms, diagnostics, treatment) 12/24/15 progr note..."AKI, possibly superimposed on CKD. Improving. Continue to hold HCTZ and ACE-I. Continue to monitor in the setting of diuresis. Renal US is negative. Renal function improving with diuresis"... Results for ISAIC, SYLER (MRN [id number]) as of 12/25/2015 11:59  12/22/2015 04:32 12/23/2015 06:01 12/24/2015 05:32 12/25/2015 05:57  BUN 71 (H) 69 (H) 52 (H) 38 (H)  Creatinine 2.12 (H) 1.91 (H) 1.65 (H) 1.39 (H)  EGFR (Non-African Amer.) 33 (L) 37 (L) 44 (L) 54 (L)   Please exercise your independent, professional judgment when responding. A specific answer is not anticipated or expected.  Thank You, Ermelinda Das, RN, BSN, [place] Certified Clinical Documentation Specialist [place]: Health Information Management [phone]

## 2015-12-25 NOTE — Progress Notes (Signed)
Pharmacy Antibiotic Note  Douglas Cannon is a 60 y.o. male admitted on 12/19/2015 with osteomyelitis.  Pharmacy has been consulted for vancomycin dosing.  Plan: A:  60 yo man with osteomyelitis to start vancomycin.  He is also on azithromycin and rocephin for CAP   Vancomycin 750 mg IV q12 hours (osteo), MRSA PCR (-)  Rocephin 1gm IV q24hrs (pna)  Zithromax  PO q24hrs (pna)  Check Vancomycin trough on 2/10 (renal fxn improving)  PHARMACIST - PHYSICIAN COMMUNICATION CONCERNING: Antibiotic IV to Oral Route Change Policy  This patient is receiving ZITHROMAX by the intravenous route.  Based on criteria approved by the Pharmacy and Therapeutics Committee, the antibiotic(s) is/are being converted to the equivalent oral dose form(s).  DESCRIPTION: These criteria include:  Patient being treated for a respiratory tract infection, urinary tract infection, cellulitis or clostridium difficile associated diarrhea if on metronidazole  The patient is not neutropenic and does not exhibit a GI malabsorption state  The patient is eating (either orally or via tube) and/or has been taking other orally administered medications for a least 24 hours  The patient is improving clinically and has a Tmax < 100.5  If you have questions about this conversion, please contact the Pharmacy Department    [phone] )  [facility]   [phone] )  [facility]   [phone] )  [facility]   [phone] )  [facility]   [phone] )  Ilene Qua   Height:  (177.8 cm) Weight: 174 lb 9.6 oz (79.198 kg) IBW/kg (Calculated) : 73  Temp (24hrs), Avg:97.8 F (36.6 C), Min:97 F (36.1 C), Max:99.1 F (37.3 C)   Recent Labs Lab 12/19/15 1311 12/19/15 1552  12/21/15 0437 12/22/15 0432 12/23/15 0601 12/24/15 0532 12/25/15 0557  WBC 16.0*  --   < > 29.4* 22.2* 14.4* 13.8* 14.2*  CREATININE 2.04*  --   < > 2.09* 2.12* 1.91* 1.65* 1.39*   LATICACIDVEN 1.9 2.1*  --   --   --   --   --   --   < > = values in this interval not displayed.  Estimated Creatinine Clearance: 59.8 mL/min (by C-G formula based on Cr of 1.39).    No Known Allergies  Antimicrobials this admission: Rocephin  2/3 >>  Azithromycin 2/3 >>  Vancomycin 2/6>>  Microbiology results: 2/3  BCx: ngtd 2/6 Wound cx with GPC 2/3 MRSA PCR: (-)  Thank you for allowing pharmacy to be a part of this patient's care.  Valrie Hart A 12/25/2015 8:51 AM

## 2015-12-25 NOTE — Addendum Note (Signed)
Addendum  created 12/25/15 0957 by Despina Hidden, CRNA   Modules edited: Clinical Notes   Clinical Notes:  File: [id number]

## 2015-12-25 NOTE — Progress Notes (Signed)
Results for MACALLISTER, ASHMEAD (MRN [id number]) as of 12/25/2015 13:05  Ref. Range 12/24/2015 16:11 12/24/2015 20:23 12/24/2015 23:02 12/25/2015 07:34 12/25/2015 11:53  Glucose-Capillary Latest Ref Range: 65-99 mg/dL 147 (H) 829 (H) 562 (H) 133 (H) 260 (H)  Received Diabetes Coordinator consult. Noted that blood sugars have been 115-260 mg/dl since admission.  Patient takes Jentadueto and Amaryl at home for diabetes. Latest HgbA1c is 7.7%, meaning that blood sugars have been running on an average around 160 mg/dl. Because of his age, this is a decent average.   With current pnuemonia and stress, blood sugars are being controlled with Novolog RESISTANT correction scale TID & HS while in the hospital.  Will continue to monitor blood sugars while in the hospital. Smith Mince RN BSN CDE

## 2015-12-26 ENCOUNTER — Inpatient Hospital Stay (HOSPITAL_COMMUNITY): Payer: BLUE CROSS/BLUE SHIELD

## 2015-12-26 LAB — CBC
HCT: 30.9 % — ABNORMAL LOW (ref 39.0–52.0)
Hemoglobin: 9.9 g/dL — ABNORMAL LOW (ref 13.0–17.0)
MCH: 27.9 pg (ref 26.0–34.0)
MCHC: 32 g/dL (ref 30.0–36.0)
MCV: 87 fL (ref 78.0–100.0)
Platelets: 237 10*3/uL (ref 150–400)
RBC: 3.55 MIL/uL — ABNORMAL LOW (ref 4.22–5.81)
RDW: 15.2 % (ref 11.5–15.5)
WBC: 16.5 10*3/uL — ABNORMAL HIGH (ref 4.0–10.5)

## 2015-12-26 LAB — GLUCOSE, CAPILLARY
Glucose-Capillary: 146 mg/dL — ABNORMAL HIGH (ref 65–99)
Glucose-Capillary: 273 mg/dL — ABNORMAL HIGH (ref 65–99)
Glucose-Capillary: 175 mg/dL — ABNORMAL HIGH (ref 65–99)

## 2015-12-26 LAB — BASIC METABOLIC PANEL
Anion gap: 11 (ref 5–15)
BUN: 33 mg/dL — ABNORMAL HIGH (ref 6–20)
CO2: 36 mmol/L — ABNORMAL HIGH (ref 22–32)
Calcium: 8.3 mg/dL — ABNORMAL LOW (ref 8.9–10.3)
Chloride: 92 mmol/L — ABNORMAL LOW (ref 101–111)
Creatinine, Ser: 1.51 mg/dL — ABNORMAL HIGH (ref 0.61–1.24)
GFR calc Af Amer: 57 mL/min — ABNORMAL LOW (ref >60)
GFR calc non Af Amer: 49 mL/min — ABNORMAL LOW (ref >60)
Glucose, Bld: 168 mg/dL — ABNORMAL HIGH (ref 65–99)
Potassium: 2.7 mmol/L — CL (ref 3.5–5.1)
Sodium: 139 mmol/L (ref 135–145)

## 2015-12-26 LAB — VANCOMYCIN, TROUGH: Vancomycin Tr: 32 ug/mL (ref 10.0–20.0)

## 2015-12-26 LAB — POTASSIUM: Potassium: 3.7 mmol/L (ref 3.5–5.1)

## 2015-12-26 MED ORDER — FUROSEMIDE 40 MG PO TABS
40.0000 mg | ORAL_TABLET | Freq: Every day | ORAL | Status: DC
Start: 2015-12-26 — End: 2017-03-27

## 2015-12-26 MED ORDER — POTASSIUM CHLORIDE CRYS ER 20 MEQ PO TBCR
40.0000 meq | EXTENDED_RELEASE_TABLET | Freq: Once | ORAL | Status: AC
  Administered 2015-12-26: 40 meq via ORAL
  Filled 2015-12-26: qty 2

## 2015-12-26 MED ORDER — DEXTROSE 5 % IV SOLN
2.0000 g | INTRAVENOUS | Status: DC
  Administered 2015-12-26: 2 g via INTRAVENOUS
  Filled 2015-12-26 (×3): qty 2

## 2015-12-26 MED ORDER — POTASSIUM CHLORIDE 10 MEQ/100ML IV SOLN
10.0000 meq | INTRAVENOUS | Status: AC
  Administered 2015-12-26 (×4): 10 meq via INTRAVENOUS
  Filled 2015-12-26: qty 100

## 2015-12-26 MED ORDER — OXYCODONE-ACETAMINOPHEN 5-325 MG PO TABS: 1.0000 | ORAL_TABLET | ORAL | Status: DC | PRN

## 2015-12-26 MED ORDER — METOPROLOL TARTRATE 25 MG PO TABS: 25.0000 mg | ORAL_TABLET | Freq: Two times a day (BID) | ORAL | Status: DC

## 2015-12-26 MED ORDER — DEXTROSE 5 % IV SOLN: 2.0000 g | INTRAVENOUS | Status: DC

## 2015-12-26 NOTE — Progress Notes (Signed)
Podiatry Progress Note  Subjective Douglas Cannon is S/P POD 2 following partial third ray amputation of the right foot.  He denies any nausea, vomiting, fever or chills.  Pain is well controlled.   Objective Vital signs in last 24 hours:   Temp:  [98 F (36.7 C)-98.5 F (36.9 C)] 98.5 F (36.9 C) (02/10 0430) Pulse Rate:  [67-73] 67 (02/10 0430) Resp:  [16-20] 20 (02/10 0430) BP: (122-130)/(70-79) 122/70 mmHg (02/10 0430) SpO2:  [91 %-95 %] 91 % (02/10 0430) Weight:  [171 lb 14.4 oz (77.973 kg)] 171 lb 14.4 oz (77.973 kg) (02/10 0430)  DRESSING: Intact with sanguinous strikethrough. INTEGUMENT: Incision is well approximated with sutures in place.  Plantar ulceration of the right foot is granular.  No active bleeding is present.  VASCULAR: Mild edema of the right foot.  Lab/Test Results   Recent Labs  12/24/15 0532 12/25/15 0557 12/26/15 0830  WBC 13.8* 14.2*  --   HGB 10.2* 10.1*  --   HCT 31.7* 31.2*  --   PLT 283 253  --   NA 141 140 139  K 3.6 3.3* 2.7*  CL 98* 95* 92*  CO2 34* 38* 36*  BUN 52* 38* 33*  CREATININE 1.65* 1.39* 1.51*  GLUCOSE 142* 137* 168*  CALCIUM 8.7* 8.4* 8.3*    Recent Results (from the past 240 hour(s))  Culture, blood (routine x 2)     Status: None   Collection Time: 12/19/15  1:11 PM  Result Value Ref Range Status   Specimen Description BLOOD RIGHT ARM  Final   Special Requests BOTTLES DRAWN AEROBIC AND ANAEROBIC 8 CC EACH  Final   Culture NO GROWTH 5 DAYS  Final   Report Status 12/24/2015 FINAL  Final  Culture, blood (routine x 2)     Status: None   Collection Time: 12/19/15  1:15 PM  Result Value Ref Range Status   Specimen Description BLOOD LEFT ARM  Final   Special Requests BOTTLES DRAWN AEROBIC AND ANAEROBIC 9CC EACH  Final   Culture NO GROWTH 5 DAYS  Final   Report Status 12/24/2015 FINAL  Final  MRSA PCR Screening     Status: None   Collection Time: 12/19/15  9:50 PM  Result Value Ref Range Status   MRSA by PCR NEGATIVE  NEGATIVE Final    Comment:        The GeneXpert MRSA Assay (FDA approved for NASAL specimens only), is one component of a comprehensive MRSA colonization surveillance program. It is not intended to diagnose MRSA infection nor to guide or monitor treatment for MRSA infections.   Wound culture     Status: None   Collection Time: 12/22/15  6:30 PM  Result Value Ref Range Status   Specimen Description WOUND RIGHT FOOT  Final   Special Requests NONE  Final   Gram Stain   Final    ABUNDANT WBC PRESENT, PREDOMINANTLY PMN NO SQUAMOUS EPITHELIAL CELLS SEEN FEW GRAM POSITIVE COCCI IN PAIRS Performed at [facility]    Culture   Final    FEW STREPTOCOCCUS GROUP G Note: Beta hemolytic streptococci are predictably susceptible to penicillin and other beta lactams. Susceptibility testing not routinely performed. Performed at [facility]    Report Status 12/25/2015 FINAL  Final  Anaerobic culture     Status: None (Preliminary result)   Collection Time: 12/24/15  1:45 PM  Result Value Ref Range Status   Specimen Description WOUND RIGHT FOOT  Final   Special  Requests ANCEF   Final   Gram Stain   Final    RARE WBC PRESENT, PREDOMINANTLY PMN NO SQUAMOUS EPITHELIAL CELLS SEEN NO ORGANISMS SEEN Performed at [facility]    Culture   Final    NO ANAEROBES ISOLATED; CULTURE IN PROGRESS FOR 5 DAYS Performed at [facility]    Report Status PENDING  Incomplete  Wound culture     Status: None (Preliminary result)   Collection Time: 12/24/15  1:45 PM  Result Value Ref Range Status   Specimen Description WOUND RIGHT FOOT  Final   Special Requests ANCEF   Final   Gram Stain   Final    RARE WBC PRESENT, PREDOMINANTLY PMN NO SQUAMOUS EPITHELIAL CELLS SEEN FEW GRAM POSITIVE COCCI IN PAIRS Performed at [facility]    Culture   Final    Culture reincubated for better growth Performed at [facility]    Report Status PENDING   Incomplete     Dg Foot Complete Right  12/24/2015  CLINICAL DATA:  Postop amputation of the right third metatarsal bone. EXAM: RIGHT FOOT COMPLETE - 3+ VIEW COMPARISON:  Plain film of the right foot dated 12/22/2015. FINDINGS: Interval amputation of the right third metatarsal bone, amputated at the proximal shaft. No surgical complicating feature seen. Remainder of the osseous structures appear intact and normal in mineralization. No destructive osseous changes to suggest additional osteomyelitis. IMPRESSION: Amputation deformity of the right third metatarsal bone. No evidence of surgical complicating feature. Electronically Signed   By: Bary Richard M.D.   On: 12/24/2015 14:52    Medications Scheduled Meds: . aspirin EC  81 mg Oral Daily  . azithromycin  500 mg Oral q1800  . citalopram  20 mg Oral Daily  . enoxaparin (LOVENOX) injection  40 mg Subcutaneous Q24H  . guaiFENesin  1,200 mg Oral BID  . insulin aspart  0-20 Units Subcutaneous TID WC  . insulin aspart  0-5 Units Subcutaneous QHS  . metoprolol tartrate  25 mg Oral BID  . potassium chloride  10 mEq Intravenous Q1 Hr x 4  . potassium chloride  40 mEq Oral Once   Continuous Infusions:  PRN Meds:.albuterol  Assessment: POD 2 S/P partial third ray amputation of the right foot.  Plan: 1.  Dressing changed.  Leave dressing C/D/I. 2.  Continue antibiotics 3.  Will need 6 weeks of IV antibiotic given the MRI findings involving the fourth metatarsal. 4.  NWB on right foot 5.  Cont PT 6.  CBC and K ordered for this afternoon. 7.  Okay for D/C from my standpoint. 8.  Will need Rx for pain medication.  Recommend Lortab 7.5/325 1 PO Q6H PRN pain 9.  F/U with me at [facility] on 02/17 at 9:00 AM   Sharronda Schweers IVAN 12/26/2015, 12:36 PM

## 2015-12-26 NOTE — Progress Notes (Signed)
PT Cancellation Note  Patient Details Name: Douglas Cannon MRN: [name]8-Mar-1957   Cancelled Treatment:    Reason Eval/Treat Not Completed: Patient not medically ready;Other (comment). PT stopped by pt room to explain reason for holding treatment, to drop off AC, and to review HEP with pt for DC. Pt reports high confidence in safe entry/exit of home s/p DC. He declines offer to perform additional gait training prior to pending DC. All education complete. No plans to see pt again prior to DC, as he is anticipated to be leaving as soon as his K+ infusion is complete.    3:23 PM, 12/26/2015 Rosamaria Lints, PT, DPT PRN Physical Therapist at [facility]  License # [id number] [phone] (wireless)  [phone] (mobile)

## 2015-12-26 NOTE — Care Management Note (Signed)
Case Management Note  Patient Details  Name: Douglas Cannon MRN: [id number] Date of Birth: 09/11/1956   Expected Discharge Date:                  Expected Discharge Plan:  Home w Home Health Services  In-House Referral:  NA  Discharge planning Services  CM Consult  Post Acute Care Choice:  Home Health, Durable Medical Equipment Choice offered to:  Patient  DME Arranged:  Walker rolling DME Agency:  [facility]  HH Arranged:  RN, IV Antibiotics HH Agency:  [facility]  Status of Service:  Completed, signed off  Medicare Important Message Given:    Date Medicare IM Given:    Medicare IM give by:    Date Additional Medicare IM Given:    Additional Medicare Important Message give by:     If discussed at Long Length of Stay Meetings, dates discussed:    Additional Comments: Pt discharging home in next 24 hrs. Pt ordered RW. PT has recommended HH PT. Pt needing 6 week IV abx. Pt has chosen [facility] for [facility] and DME services. Therisa Doyne, of [facility], made aware of referrals and will obtain pt info from chart. RW has been delivered and [facility] has arranged for IV abx infusions to begin on 12/27/2015. [facility] will be updated if pt is not DC'd home this afternoon. No further CM needs.   Malcolm Metro, RN 12/26/2015, 2:25 PM

## 2015-12-26 NOTE — Progress Notes (Signed)
CRITICAL VALUE ALERT  Critical value received:  K+ - 2.7, vanc trough - 32  Date of notification:  12/26/15  Time of notification:  0930  Critical value read back:Yes.    Nurse who received alert:  Sherrye Payor RN  MD notified (1st page):  Dr. Kerry Hough  Time of first page:  0932  MD notified (2nd page):  Time of second page:  Responding MD:  Dr. Kerry Hough  Time MD responded:    Pharm notified of vanc trough.  Scheduled dose held and will give once pharmacy corrects dose

## 2015-12-26 NOTE — Progress Notes (Signed)
Pt is being d/c'd home with Home health.  Picc in place. Clean, dry and intact.  RX's given to patient.  F/U with Dr. Linna Caprice has been scheduled.  Education in place for heart failure.  Surgical boot being provided.  Crutches in room for patient to take home.  Dr. Linna Caprice has spoke with pt regarding NO dressing changes at home, Only reinforce if  Blood noted on dressing.  Dr. Linna Caprice provided patient with dressing supplies.  Heart failure education provided to patient with verbal understanding.  Pt in stable condition and awaiting return of wife from dinner so she can transport home.  Patient is in stable condition, denies pain.  Will report to oncoming shift.

## 2015-12-26 NOTE — Progress Notes (Signed)
PT Cancellation Note  Patient Details Name: Douglas Cannon MRN: [id number] DOB: [birth year]   Cancelled Treatment:    Reason Eval/Treat Not Completed: Patient not medically ready. Holding PT treatment at this time. Chart review reveals K+: 2.7. Per policy pts c K+: <3.0 are not appropriate for PT. Will monitor remotely and re attempt at later date/time.     1:42 PM, 12/26/2015 Rosamaria Lints, PT, DPT PRN Physical Therapist at [facility] License # [id number] [phone] (wireless)  [phone] (mobile)

## 2015-12-26 NOTE — Discharge Summary (Signed)
Physician Discharge Summary  Douglas Cannon ZOX:[id number] DOB: [birth year] DOA: 12/19/2015  PCP: No PCP Per Patient  Admit date: 12/19/2015 Discharge date: 12/26/2015  Time spent: 35 minutes  Recommendations for Outpatient Follow-up:  1. Patient will follow up with podiatry as an outpatient 2. He will continue 6 weeks of intravenous rocephin 3. Follow up with PCP in 1-2 weeks   Discharge Diagnoses:  Active Problems:   CAP (community acquired pneumonia)   Diabetes mellitus without complication (HCC)   Hypertension   Acute renal injury (HCC)   Bronchospasm   Acute respiratory failure (HCC)   Osteomyelitis of right foot (HCC)   Acute diastolic heart failure [facility])    Discharge Condition: Improved  Diet recommendation: low carb, low salt  Filed Weights   12/24/15 1030 12/25/15 0431 12/26/15 0430  Weight: 80.74 kg (178 lb) 79.198 kg (174 lb 9.6 oz) 77.973 kg (171 lb 14.4 oz)    History of present illness:  60 yom with a hx of  DM type 2 and HTN presented complaining of a one week history of shortness of breath, wheezing, and decreased appetite. He was found to have CAP. He was admitted for further treatment.  Hospital Course:  Acute hypoxic respiratory failure. Likely related to CAP and CHF. Patient was weaned off of oxygen and is now breathing comfortably on room air.  1. CAP, revealed on CXR. BC show no growth to date. He was adequately treated with intravenous abx in hospital.  He remains afebrile and is breathing comfortably. 2. Acute on chronic diastolic CHF. Patient was treated with IV lasix. His weight is down approximately 27lbs since admission. ECHO as below. Creatinine improved mildly with diuresis. He will be transitioned to oral lasix. He is on a beta blocker. 3. AKI. Improved.  HCTZ and ACE-I were held on admission.  Renal US was negative.  4. Anemia. No evidence of bleeding. He will likely need outpatient follow up.  5. DM type 2, stable. A1C was 7.7, will continue  outpatient regimen. 6. Essential HTN, stable. Continue Metoprolol.  7. Osteomyelitis of the right foot. MRI indicated underlying osteomyelitis with pus. He was treated with IV abx.  Podiatry has consulted and performed a partial third ray amputation 2/8. Cultures indicate group B strep. Due to underlying osteomyelitis, he will receive 6 weeks of IV abx therapy. Will followup with podiatry.   Procedures:  ECHO Left ventricle: The cavity size was normal. There was mild focal basal hypertrophy of the septum. Systolic function was normal. The estimated ejection fraction was in the range of 55% to 60%. Wall motion was normal; there were no regional wall motion abnormalities. The study is not technically sufficient to allow evaluation of LV diastolic function. - Mitral valve: There was mild regurgitation. - Right ventricle: The cavity size was normal. Wall thickness was normal. Systolic function was normal. - Tricuspid valve: There was mild regurgitation. - Pulmonic valve: There was trivial regurgitation. - Pulmonary arteries: Systolic pressure was mildly increased. PA peak pressure: 43 mm Hg (S).  Partial third ray amputation, right foot.  Consultations:  Podiatry  PT-Home health PT  Discharge Exam: Filed Vitals:   12/26/15 0430 12/26/15 1429  BP: 122/70 112/77  Pulse: 67 70  Temp: 98.5 F (36.9 C) 98 F (36.7 C)  Resp: 20 18     General: NAD, looks comfortable  Cardiovascular: RRR, S1, S2   Respiratory: clear bilaterally, No wheezing, rales or rhonchi  Abdomen: soft, non tender, no distention , bowel sounds normal  Musculoskeletal: right foot dressing was not removed   Discharge Instructions   Discharge Instructions    Diet - low sodium heart healthy    Complete by:  As directed      Increase activity slowly    Complete by:  As directed           Current Discharge Medication List    START taking these medications   Details  cefTRIAXone 2  g in dextrose 5 % 50 mL Inject 2 g into the vein daily. Until 02/06/16    furosemide (LASIX) 40 MG tablet Take 1 tablet (40 mg total) by mouth daily. Start on 2/14 Qty: 30 tablet, Refills: 1    metoprolol tartrate (LOPRESSOR) 25 MG tablet Take 1 tablet (25 mg total) by mouth 2 (two) times daily. Qty: 60 tablet, Refills: 0    oxyCODONE-acetaminophen (ROXICET) 5-325 MG tablet Take 1-2 tablets by mouth every 4 (four) hours as needed for severe pain. Qty: 40 tablet, Refills: 0      CONTINUE these medications which have NOT CHANGED   Details  aspirin EC 81 MG tablet Take 81 mg by mouth daily.    citalopram (CELEXA) 20 MG tablet Take 20 mg by mouth daily.    Cyanocobalamin (B-12 PO) Take 1 tablet by mouth daily.    glimepiride (AMARYL) 4 MG tablet Take 4 mg by mouth daily with breakfast.    Linagliptin-Metformin HCl (JENTADUETO) 2.03-999 MG TABS Take 1 tablet by mouth 2 (two) times daily.    Multiple Vitamin (MULTIVITAMIN WITH MINERALS) TABS tablet Take 1 tablet by mouth daily.      STOP taking these medications     amLODipine-benazepril (LOTREL) 5-20 MG capsule      hydrochlorothiazide (HYDRODIURIL) 25 MG tablet      levofloxacin (LEVAQUIN) 750 MG tablet        No Known Allergies    The results of significant diagnostics from this hospitalization (including imaging, microbiology, ancillary and laboratory) are listed below for reference.    Significant Diagnostic Studies: Dg Chest 2 View  12/22/2015  CLINICAL DATA:  Community acquired pneumonia.  Acute renal injury. EXAM: CHEST  2 VIEW COMPARISON:  12/19/2015 FINDINGS: Bilateral pulmonary infiltrates again seen with greatest consolidation in the left upper lobe. Small bilateral pleural effusions again noted. These findings show no significant interval change. Heart size remains stable. IMPRESSION: No significant change in bilateral infiltrates with maximal consolidation involving the left upper lobe. Small bilateral pleural  effusions also without significant change. Electronically Signed   By: Myles Rosenthal M.D.   On: 12/22/2015 08:44   Dg Chest 2 View  12/19/2015  CLINICAL DATA:  Cough and congestion for 1 week EXAM: CHEST  2 VIEW COMPARISON:  12/14/2015 FINDINGS: Cardiac shadow is stable. Increasing perihilar infiltrates are noted bilaterally worse on the left than the right. Right lower lobe infiltrate is noted as well. Bony structures are stable. IMPRESSION: Increasing bilateral infiltrates left greater than right. Electronically Signed   By: Alcide Clever M.D.   On: 12/19/2015 14:25   Dg Chest 2 View  12/14/2015  CLINICAL DATA:  Cough and fever EXAM: CHEST  2 VIEW COMPARISON:  None. FINDINGS: Cardiac shadow is within normal limits. Patchy perihilar infiltrates are noted slightly worse on the right than the left as well as some right basilar infiltrate. No sizable effusion is seen. No bony abnormality is seen. IMPRESSION: Bilateral perihilar infiltrates slightly greater on the left than the right. Additionally right basilar infiltrate is seen. Electronically  Signed   By: Alcide Clever M.D.   On: 12/14/2015 15:28   US Renal  12/21/2015  CLINICAL DATA:  60 year old male with acute renal injury. Hypertension. Initial encounter. EXAM: RENAL / URINARY TRACT ULTRASOUND COMPLETE COMPARISON:  Chest radiographs 12/19/2015 FINDINGS: Right Kidney: Length: 11.3 cm. Echogenicity within normal limits. No mass or hydronephrosis visualized. Left Kidney: Length: 11.6 cm. Left kidney less well visualized Echogenicity within normal limits. No mass or hydronephrosis visualized. Bladder: Appears normal for degree of bladder distention. Other findings: Bilateral pleural effusions re- demonstrated. IMPRESSION: 1. Normal sonographic appearance of the kidneys and bladder. 2. Bilateral pleural effusions. Electronically Signed   By: Odessa Fleming M.D.   On: 12/21/2015 12:28   Mr Foot Right Wo Contrast  12/22/2015  CLINICAL DATA:  Diabetic ulcer of the plantar  aspect of the right foot at the third through fifth digits. EXAM: MRI OF THE RIGHT FOREFOOT WITHOUT CONTRAST TECHNIQUE: Multiplanar, multisequence MR imaging was performed. No intravenous contrast was administered. COMPARISON:  None. FINDINGS: There is osteomyelitis in the third metatarsal and of the base of the proximal phalanx of the third toe. There is a pathologic fracture shaft of the third metatarsal. There is subperiosteal pus along the distal third metatarsal. The third metatarsal phalangeal joint is infected and there is dorsal dislocation of the proximal phalanx of the third toe. The pus extends to the plantar aspect of the ball of the foot communicating with the soft tissue ulcer. There is a 6 mm area of low signal at the dorsal aspect of the distal fourth metatarsal in the subcutaneous soft tissues which could represent a foreign body or gas in the soft tissues. There is pus surrounding this abnormality, probably arising from the third metatarsal phalangeal joint There is slight edema in the distal fourth metatarsal which is most likely reactive. There is nonspecific subcutaneous edema of the distal forefoot. Soft tissue ulceration of the plantar aspect of the ball of the foot extends from just lateral to the head of the second metatarsal to the fifth metatarsal phalangeal joint. IMPRESSION: Osteomyelitis of the entire third metatarsal as well as the proximal phalanx of the third toe with a septic third metatarsal phalangeal joint with pus extending dorsally and to the plantar surface of the ball of the foot at the site of the ulcer. Electronically Signed   By: Francene Boyers M.D.   On: 12/22/2015 08:53   US Arterial Seg Multiple  12/23/2015  CLINICAL DATA:  60 year old male with a history of right foot ulcer. Cardiovascular risk factors including hypertension, diabetes EXAM: NONINVASIVE PHYSIOLOGIC VASCULAR STUDY OF BILATERAL LOWER EXTREMITIES TECHNIQUE: Evaluation of both lower extremities was  performed at rest, including calculation of ankle-brachial indices, multiple segmental pressure evaluation, segmental Doppler and segmental pulse volume recording. COMPARISON:  None. FINDINGS: Right: Resting ankle brachial index:  1.22 Segmental blood pressure: Upper extremity brachial pressure is relatively symmetric. Appropriate increased to the leg. Significant decrease pressure from high thigh to low thigh. Digital pressure measures 119 systolic. Doppler: Segmental Doppler of the right lower extremity demonstrates triphasic waveform of the common femoral arteries superficial femoral artery and popliteal artery. Deterioration of the waveform of the tibial vessels with monophasic waveform of the posterior tibial artery and dorsalis pedis. Pulse volume recording: Segmental PVR of the right lower extremity demonstrates maintained waveform of the thigh, with loss of augmentation from high thigh to below knee. Waveform maintained at the ankle metatarsal and digital segment. Left: Resting ankle brachial index: 1.27 Segmental blood  pressure: Symmetric brachial pressures to the right upper extremity. Appropriate increase to the lower extremity with no significant drop from segment a segment. Doppler: Segmental Doppler of the left lower extremity demonstrates triphasic waveform of the common femoral artery and superficial femoral artery. Triphasic waveform of the popliteal artery, posterior tibial artery, and dorsalis pedis. Pulse volume recording: Segmental PVR of the left lower extremity demonstrates maintained waveform of the thigh. Loss of augmentation from high thigh to below knee. Waveform maintained at the ankle, metatarsal segment and digital segment. IMPRESSION: Right: Although the resting ankle-brachial index is within normal limits, the remainder of the study is suggestive of significant tibial disease, and likely of femoral popliteal disease decreasing flow to the foot. Left: Noninvasive arterial exam  demonstrates no evidence of significant occlusive disease. Signed, Yvone Neu. Loreta Ave, DO [facility] Radiology Electronically Signed   By: Gilmer Mor D.O.   On: 12/23/2015 17:31   Dg Foot Complete Right  12/24/2015  CLINICAL DATA:  Postop amputation of the right third metatarsal bone. EXAM: RIGHT FOOT COMPLETE - 3+ VIEW COMPARISON:  Plain film of the right foot dated 12/22/2015. FINDINGS: Interval amputation of the right third metatarsal bone, amputated at the proximal shaft. No surgical complicating feature seen. Remainder of the osseous structures appear intact and normal in mineralization. No destructive osseous changes to suggest additional osteomyelitis. IMPRESSION: Amputation deformity of the right third metatarsal bone. No evidence of surgical complicating feature. Electronically Signed   By: Bary Richard M.D.   On: 12/24/2015 14:52   Dg Foot Complete Right  12/22/2015  CLINICAL DATA:  Osteomyelitis ball of rt foot. Pt had a callus that he picked and it got infected. He is diabetic. EXAM: RIGHT FOOT COMPLETE - 3+ VIEW COMPARISON:  12/22/2015 FINDINGS: Loss of cortex involves the third metatarsal, with thick circumferential periosteal reaction involving the metatarsal shaft and head. There is loss of cortex and underlying bone involving most of the proximal phalanx as well. There is air in the soft tissues projecting over the third and fourth metatarsals. IMPRESSION: Extensive osteomyelitis involving the third metatarsal and proximal phalanx. Air in the soft tissues consistent with infection. Electronically Signed   By: Esperanza Heir M.D.   On: 12/22/2015 15:21    Microbiology: Recent Results (from the past 240 hour(s))  Culture, blood (routine x 2)     Status: None   Collection Time: 12/19/15  1:11 PM  Result Value Ref Range Status   Specimen Description BLOOD RIGHT ARM  Final   Special Requests BOTTLES DRAWN AEROBIC AND ANAEROBIC 8 CC EACH   Final   Culture NO GROWTH 5 DAYS  Final   Report Status 12/24/2015 FINAL  Final  Culture, blood (routine x 2)     Status: None   Collection Time: 12/19/15  1:15 PM  Result Value Ref Range Status   Specimen Description BLOOD LEFT ARM  Final   Special Requests BOTTLES DRAWN AEROBIC AND ANAEROBIC 9CC EACH  Final   Culture NO GROWTH 5 DAYS  Final   Report Status 12/24/2015 FINAL  Final  MRSA PCR Screening     Status: None   Collection Time: 12/19/15  9:50 PM  Result Value Ref Range Status   MRSA by PCR NEGATIVE NEGATIVE Final    Comment:        The GeneXpert MRSA Assay (FDA approved for NASAL specimens only), is one component of a comprehensive MRSA colonization surveillance program. It is not intended to diagnose MRSA  infection nor to guide or monitor treatment for MRSA infections.   Wound culture     Status: None   Collection Time: 12/22/15  6:30 PM  Result Value Ref Range Status   Specimen Description WOUND RIGHT FOOT  Final   Special Requests NONE  Final   Gram Stain   Final    ABUNDANT WBC PRESENT, PREDOMINANTLY PMN NO SQUAMOUS EPITHELIAL CELLS SEEN FEW GRAM POSITIVE COCCI IN PAIRS Performed at [facility]    Culture   Final    FEW STREPTOCOCCUS GROUP G Note: Beta hemolytic streptococci are predictably susceptible to penicillin and other beta lactams. Susceptibility testing not routinely performed. Performed at [facility]    Report Status 12/25/2015 FINAL  Final  Anaerobic culture     Status: None (Preliminary result)   Collection Time: 12/24/15  1:45 PM  Result Value Ref Range Status   Specimen Description WOUND RIGHT FOOT  Final   Special Requests ANCEF   Final   Gram Stain   Final    RARE WBC PRESENT, PREDOMINANTLY PMN NO SQUAMOUS EPITHELIAL CELLS SEEN NO ORGANISMS SEEN Performed at [facility]    Culture   Final    NO ANAEROBES ISOLATED; CULTURE IN PROGRESS FOR 5 DAYS Performed at [facility]    Report Status  PENDING  Incomplete  Wound culture     Status: None (Preliminary result)   Collection Time: 12/24/15  1:45 PM  Result Value Ref Range Status   Specimen Description WOUND RIGHT FOOT  Final   Special Requests ANCEF   Final   Gram Stain   Final    RARE WBC PRESENT, PREDOMINANTLY PMN NO SQUAMOUS EPITHELIAL CELLS SEEN FEW GRAM POSITIVE COCCI IN PAIRS Performed at [facility]    Culture   Final    Culture reincubated for better growth Performed at [facility]    Report Status PENDING  Incomplete     Labs: Basic Metabolic Panel:  Recent Labs Lab 12/22/15 0432 12/23/15 0601 12/24/15 0532 12/25/15 0557 12/26/15 0830 12/26/15 1628  NA 141 142 141 140 139  --   K 3.7 3.9 3.6 3.3* 2.7* 3.7  CL 104 103 98* 95* 92*  --   CO2 27 30 34* 38* 36*  --   GLUCOSE 119* 148* 142* 137* 168*  --   BUN 71* 69* 52* 38* 33*  --   CREATININE 2.12* 1.91* 1.65* 1.39* 1.51*  --   CALCIUM 8.5* 8.7* 8.7* 8.4* 8.3*  --    CBC:  Recent Labs Lab 12/22/15 0432 12/23/15 0601 12/24/15 0532 12/25/15 0557 12/26/15 1628  WBC 22.2* 14.4* 13.8* 14.2* 16.5*  HGB 9.5* 9.5* 10.2* 10.1* 9.9*  HCT 29.1* 28.9* 31.7* 31.2* 30.9*  MCV 87.9 87.6 87.6 87.2 87.0  PLT 335 297 283 253 237   BNP: BNP (last 3 results)  Recent Labs  12/19/15 1311 12/21/15 0437  BNP 4067.0* 2564.0*    CBG:  Recent Labs Lab 12/25/15 1622 12/25/15 2032 12/26/15 0716 12/26/15 1115 12/26/15 1628  GLUCAP 146* 176* 146* 273* 175*     Signed: Erick Blinks, MD. [facility] 12/26/2015, 5:30 PM   By signing my name below, I, Adron Bene, attest that this documentation has been prepared under the direction and in the presence of Erick Blinks, MD. Electronically Signed: Adron Bene  12/26/2015  I, Dr. Erick Blinks, personally performed the services described in this documentaiton. All medical record entries made by the scribe were at my  direction and in my presence. I have  reviewed the chart and agree that the record reflects my personal performance and is accurate and complete  Erick Blinks, MD, 12/26/2015 5:30 PM

## 2015-12-26 NOTE — Progress Notes (Signed)
Pharmacy Antibiotic Note  Douglas Cannon is a 60 y.o. male admitted on 12/19/2015 with osteomyelitis.  Pharmacy has been consulted for vancomycin dosing.  Plan: A:  60 yo man with osteomyelitis on vancomycin.  He is also on azithromycin and rocephin for CAP. Vancomycin trough level 21mcg/ml. Will hold vancomycin and recheck level with AM labs.   Vancomycin 750 mg IV q12 hours (osteo), MRSA PCR (-)  On hold.  Rocephin 1gm IV q24hrs (pna)  Zithromax  PO q24hrs (pna)  Height:  (177.8 cm) Weight: 171 lb 14.4 oz (77.973 kg) IBW/kg (Calculated) : 73  Temp (24hrs), Avg:98.3 F (36.8 C), Min:98 F (36.7 C), Max:98.5 F (36.9 C)   Recent Labs Lab 12/19/15 1311 12/19/15 1552  12/21/15 0437 12/22/15 0432 12/23/15 0601 12/24/15 0532 12/25/15 0557 12/26/15 0830  WBC 16.0*  --   < > 29.4* 22.2* 14.4* 13.8* 14.2*  --   CREATININE 2.04*  --   < > 2.09* 2.12* 1.91* 1.65* 1.39* 1.51*  LATICACIDVEN 1.9 2.1*  --   --   --   --   --   --   --   VANCOTROUGH  --   --   --   --   --   --   --   --  32*  < > = values in this interval not displayed.  Estimated Creatinine Clearance: 55.1 mL/min (by C-G formula based on Cr of 1.51).    No Known Allergies  Antimicrobials this admission: Rocephin  2/3 >>  Azithromycin 2/3 >>  Vancomycin 2/6>>  Microbiology results: 2/3  BCx: ngtd 2/6 Wound cx with GPC 2/3 MRSA PCR: (-)  Thank you for allowing pharmacy to be a part of this patient's care.  Tera Mater 12/26/2015 12:24 PM

## 2015-12-28 LAB — WOUND CULTURE

## 2015-12-29 ENCOUNTER — Encounter (HOSPITAL_COMMUNITY): Payer: Self-pay | Admitting: Podiatry

## 2015-12-29 LAB — ANAEROBIC CULTURE

## 2016-06-10 ENCOUNTER — Other Ambulatory Visit (HOSPITAL_COMMUNITY): Payer: Self-pay | Admitting: Internal Medicine

## 2016-06-10 DIAGNOSIS — N289 Disorder of kidney and ureter, unspecified: Secondary | ICD-10-CM

## 2017-02-28 ENCOUNTER — Other Ambulatory Visit (HOSPITAL_COMMUNITY): Payer: Self-pay | Admitting: Nephrology

## 2017-02-28 DIAGNOSIS — N183 Chronic kidney disease, stage 3 unspecified: Secondary | ICD-10-CM

## 2017-03-11 ENCOUNTER — Inpatient Hospital Stay (HOSPITAL_COMMUNITY)
Admission: EM | Admit: 2017-03-11 | Discharge: 2017-03-27 | DRG: 233 | Disposition: A | Discharge status: Home / Self Care | Payer: BLUE CROSS/BLUE SHIELD | Attending: Cardiothoracic Surgery | Admitting: Cardiothoracic Surgery

## 2017-03-11 ENCOUNTER — Encounter (HOSPITAL_COMMUNITY): Payer: Self-pay | Admitting: Emergency Medicine

## 2017-03-11 ENCOUNTER — Emergency Department (HOSPITAL_COMMUNITY): Payer: BLUE CROSS/BLUE SHIELD

## 2017-03-11 DIAGNOSIS — I214 Non-ST elevation (NSTEMI) myocardial infarction: Secondary | ICD-10-CM | POA: Diagnosis not present

## 2017-03-11 DIAGNOSIS — R945 Abnormal results of liver function studies: Secondary | ICD-10-CM | POA: Diagnosis present

## 2017-03-11 DIAGNOSIS — J181 Lobar pneumonia, unspecified organism: Secondary | ICD-10-CM | POA: Diagnosis not present

## 2017-03-11 DIAGNOSIS — E872 Acidosis, unspecified: Secondary | ICD-10-CM

## 2017-03-11 DIAGNOSIS — I255 Ischemic cardiomyopathy: Secondary | ICD-10-CM | POA: Diagnosis present

## 2017-03-11 DIAGNOSIS — R7989 Other specified abnormal findings of blood chemistry: Secondary | ICD-10-CM

## 2017-03-11 DIAGNOSIS — F419 Anxiety disorder, unspecified: Secondary | ICD-10-CM | POA: Diagnosis present

## 2017-03-11 DIAGNOSIS — Z7984 Long term (current) use of oral hypoglycemic drugs: Secondary | ICD-10-CM | POA: Diagnosis not present

## 2017-03-11 DIAGNOSIS — I5043 Acute on chronic combined systolic (congestive) and diastolic (congestive) heart failure: Secondary | ICD-10-CM | POA: Diagnosis present

## 2017-03-11 DIAGNOSIS — J9601 Acute respiratory failure with hypoxia: Secondary | ICD-10-CM

## 2017-03-11 DIAGNOSIS — E873 Alkalosis: Secondary | ICD-10-CM | POA: Diagnosis present

## 2017-03-11 DIAGNOSIS — Z7982 Long term (current) use of aspirin: Secondary | ICD-10-CM

## 2017-03-11 DIAGNOSIS — R6510 Systemic inflammatory response syndrome (SIRS) of non-infectious origin without acute organ dysfunction: Secondary | ICD-10-CM

## 2017-03-11 DIAGNOSIS — I2699 Other pulmonary embolism without acute cor pulmonale: Secondary | ICD-10-CM | POA: Diagnosis not present

## 2017-03-11 DIAGNOSIS — J810 Acute pulmonary edema: Secondary | ICD-10-CM | POA: Diagnosis not present

## 2017-03-11 DIAGNOSIS — I130 Hypertensive heart and chronic kidney disease with heart failure and stage 1 through stage 4 chronic kidney disease, or unspecified chronic kidney disease: Secondary | ICD-10-CM | POA: Diagnosis present

## 2017-03-11 DIAGNOSIS — E11649 Type 2 diabetes mellitus with hypoglycemia without coma: Secondary | ICD-10-CM | POA: Diagnosis not present

## 2017-03-11 DIAGNOSIS — E119 Type 2 diabetes mellitus without complications: Secondary | ICD-10-CM

## 2017-03-11 DIAGNOSIS — N400 Enlarged prostate without lower urinary tract symptoms: Secondary | ICD-10-CM | POA: Diagnosis present

## 2017-03-11 DIAGNOSIS — J189 Pneumonia, unspecified organism: Secondary | ICD-10-CM | POA: Diagnosis not present

## 2017-03-11 DIAGNOSIS — R0902 Hypoxemia: Secondary | ICD-10-CM | POA: Diagnosis not present

## 2017-03-11 DIAGNOSIS — N183 Chronic kidney disease, stage 3 (moderate): Secondary | ICD-10-CM | POA: Diagnosis present

## 2017-03-11 DIAGNOSIS — Z09 Encounter for follow-up examination after completed treatment for conditions other than malignant neoplasm: Secondary | ICD-10-CM

## 2017-03-11 DIAGNOSIS — J9600 Acute respiratory failure, unspecified whether with hypoxia or hypercapnia: Secondary | ICD-10-CM | POA: Diagnosis not present

## 2017-03-11 DIAGNOSIS — R748 Abnormal levels of other serum enzymes: Secondary | ICD-10-CM

## 2017-03-11 DIAGNOSIS — D62 Acute posthemorrhagic anemia: Secondary | ICD-10-CM | POA: Diagnosis not present

## 2017-03-11 DIAGNOSIS — I10 Essential (primary) hypertension: Secondary | ICD-10-CM | POA: Diagnosis not present

## 2017-03-11 DIAGNOSIS — E1165 Type 2 diabetes mellitus with hyperglycemia: Secondary | ICD-10-CM | POA: Diagnosis present

## 2017-03-11 DIAGNOSIS — N179 Acute kidney failure, unspecified: Secondary | ICD-10-CM

## 2017-03-11 DIAGNOSIS — I5021 Acute systolic (congestive) heart failure: Secondary | ICD-10-CM

## 2017-03-11 DIAGNOSIS — R778 Other specified abnormalities of plasma proteins: Secondary | ICD-10-CM

## 2017-03-11 DIAGNOSIS — E876 Hypokalemia: Secondary | ICD-10-CM | POA: Diagnosis not present

## 2017-03-11 DIAGNOSIS — I509 Heart failure, unspecified: Secondary | ICD-10-CM

## 2017-03-11 DIAGNOSIS — L97529 Non-pressure chronic ulcer of other part of left foot with unspecified severity: Secondary | ICD-10-CM | POA: Diagnosis present

## 2017-03-11 DIAGNOSIS — I2510 Atherosclerotic heart disease of native coronary artery without angina pectoris: Secondary | ICD-10-CM

## 2017-03-11 DIAGNOSIS — E1122 Type 2 diabetes mellitus with diabetic chronic kidney disease: Secondary | ICD-10-CM | POA: Diagnosis present

## 2017-03-11 DIAGNOSIS — E11621 Type 2 diabetes mellitus with foot ulcer: Secondary | ICD-10-CM | POA: Diagnosis present

## 2017-03-11 DIAGNOSIS — B9789 Other viral agents as the cause of diseases classified elsewhere: Secondary | ICD-10-CM | POA: Diagnosis present

## 2017-03-11 DIAGNOSIS — Z951 Presence of aortocoronary bypass graft: Secondary | ICD-10-CM

## 2017-03-11 DIAGNOSIS — E1169 Type 2 diabetes mellitus with other specified complication: Secondary | ICD-10-CM

## 2017-03-11 DIAGNOSIS — I213 ST elevation (STEMI) myocardial infarction of unspecified site: Secondary | ICD-10-CM | POA: Diagnosis not present

## 2017-03-11 DIAGNOSIS — I82542 Chronic embolism and thrombosis of left tibial vein: Secondary | ICD-10-CM | POA: Diagnosis present

## 2017-03-11 DIAGNOSIS — E785 Hyperlipidemia, unspecified: Secondary | ICD-10-CM | POA: Diagnosis present

## 2017-03-11 DIAGNOSIS — I440 Atrioventricular block, first degree: Secondary | ICD-10-CM | POA: Diagnosis not present

## 2017-03-11 DIAGNOSIS — D631 Anemia in chronic kidney disease: Secondary | ICD-10-CM | POA: Diagnosis present

## 2017-03-11 DIAGNOSIS — Z79899 Other long term (current) drug therapy: Secondary | ICD-10-CM

## 2017-03-11 DIAGNOSIS — I25110 Atherosclerotic heart disease of native coronary artery with unstable angina pectoris: Secondary | ICD-10-CM | POA: Diagnosis not present

## 2017-03-11 DIAGNOSIS — R0602 Shortness of breath: Secondary | ICD-10-CM

## 2017-03-11 DIAGNOSIS — E1140 Type 2 diabetes mellitus with diabetic neuropathy, unspecified: Secondary | ICD-10-CM | POA: Diagnosis present

## 2017-03-11 DIAGNOSIS — I2584 Coronary atherosclerosis due to calcified coronary lesion: Secondary | ICD-10-CM | POA: Diagnosis present

## 2017-03-11 DIAGNOSIS — I77819 Aortic ectasia, unspecified site: Secondary | ICD-10-CM

## 2017-03-11 DIAGNOSIS — Z89431 Acquired absence of right foot: Secondary | ICD-10-CM

## 2017-03-11 DIAGNOSIS — T502X5A Adverse effect of carbonic-anhydrase inhibitors, benzothiadiazides and other diuretics, initial encounter: Secondary | ICD-10-CM | POA: Diagnosis not present

## 2017-03-11 DIAGNOSIS — Z01810 Encounter for preprocedural cardiovascular examination: Secondary | ICD-10-CM | POA: Diagnosis not present

## 2017-03-11 DIAGNOSIS — E8770 Fluid overload, unspecified: Secondary | ICD-10-CM

## 2017-03-11 DIAGNOSIS — N19 Unspecified kidney failure: Secondary | ICD-10-CM

## 2017-03-11 HISTORY — DX: Chronic kidney disease, stage 3 (moderate): N18.3

## 2017-03-11 HISTORY — DX: Essential (primary) hypertension: I10

## 2017-03-11 HISTORY — DX: Type 2 diabetes mellitus without complications: E11.9

## 2017-03-11 HISTORY — DX: Anxiety disorder, unspecified: F41.9

## 2017-03-11 HISTORY — DX: Chronic kidney disease, stage 3 unspecified: N18.30

## 2017-03-11 HISTORY — DX: Anemia, unspecified: D64.9

## 2017-03-11 HISTORY — DX: Personal history of pneumonia (recurrent): Z87.01

## 2017-03-11 HISTORY — DX: Hyperlipidemia, unspecified: E78.5

## 2017-03-11 HISTORY — DX: Unspecified diastolic (congestive) heart failure: I50.30

## 2017-03-11 LAB — TROPONIN I
Troponin I: 16.91 ng/mL (ref <0.03)
Troponin I: 29.89 ng/mL (ref <0.03)

## 2017-03-11 LAB — CBC WITH DIFFERENTIAL/PLATELET
Basophils Absolute: 0 10*3/uL (ref 0.0–0.1)
Basophils Relative: 0 %
Eosinophils Absolute: 0 10*3/uL (ref 0.0–0.7)
Eosinophils Relative: 0 %
HCT: 36.6 % — ABNORMAL LOW (ref 39.0–52.0)
Hemoglobin: 12.4 g/dL — ABNORMAL LOW (ref 13.0–17.0)
Lymphocytes Relative: 5 %
Lymphs Abs: 1.3 10*3/uL (ref 0.7–4.0)
MCH: 30.6 pg (ref 26.0–34.0)
MCHC: 33.9 g/dL (ref 30.0–36.0)
MCV: 90.4 fL (ref 78.0–100.0)
Monocytes Absolute: 1.1 10*3/uL — ABNORMAL HIGH (ref 0.1–1.0)
Monocytes Relative: 4 %
Neutro Abs: 24.9 10*3/uL — ABNORMAL HIGH (ref 1.7–7.7)
Neutrophils Relative %: 91 %
Platelets: 289 10*3/uL (ref 150–400)
RBC: 4.05 MIL/uL — ABNORMAL LOW (ref 4.22–5.81)
RDW: 14.1 % (ref 11.5–15.5)
WBC: 27.3 10*3/uL — ABNORMAL HIGH (ref 4.0–10.5)

## 2017-03-11 LAB — BLOOD GAS, ARTERIAL
Acid-base deficit: 8.3 mmol/L — ABNORMAL HIGH (ref 0.0–2.0)
Acid-base deficit: 6.7 mmol/L — ABNORMAL HIGH (ref 0.0–2.0)
Bicarbonate: 17.9 mmol/L — ABNORMAL LOW (ref 20.0–28.0)
Bicarbonate: 19.3 mmol/L — ABNORMAL LOW (ref 20.0–28.0)
Drawn by: 277331
Drawn by: 277331
Expiratory PAP: 6
FIO2: 1
FIO2: 0.55
Inspiratory PAP: 12
Mode: POSITIVE
O2 Content: 15 L/min
O2 Saturation: 69.9 %
O2 Saturation: 85.3 %
Patient temperature: 37
Patient temperature: 37
RATE: 15 resp/min
pCO2 arterial: 25.3 mmHg — ABNORMAL LOW (ref 32.0–48.0)
pCO2 arterial: 26.3 mmHg — ABNORMAL LOW (ref 32.0–48.0)
pH, Arterial: 7.404 (ref 7.350–7.450)
pH, Arterial: 7.423 (ref 7.350–7.450)
pO2, Arterial: 39.3 mmHg — CL (ref 83.0–108.0)
pO2, Arterial: 52.6 mmHg — ABNORMAL LOW (ref 83.0–108.0)

## 2017-03-11 LAB — GLUCOSE, CAPILLARY
Glucose-Capillary: 248 mg/dL — ABNORMAL HIGH (ref 65–99)
Glucose-Capillary: 177 mg/dL — ABNORMAL HIGH (ref 65–99)

## 2017-03-11 LAB — COMPREHENSIVE METABOLIC PANEL
ALT: 18 U/L (ref 17–63)
AST: 70 U/L — ABNORMAL HIGH (ref 15–41)
Albumin: 3.1 g/dL — ABNORMAL LOW (ref 3.5–5.0)
Alkaline Phosphatase: 50 U/L (ref 38–126)
Anion gap: 17 — ABNORMAL HIGH (ref 5–15)
BUN: 64 mg/dL — ABNORMAL HIGH (ref 6–20)
CO2: 17 mmol/L — ABNORMAL LOW (ref 22–32)
Calcium: 8.7 mg/dL — ABNORMAL LOW (ref 8.9–10.3)
Chloride: 102 mmol/L (ref 101–111)
Creatinine, Ser: 2.86 mg/dL — ABNORMAL HIGH (ref 0.61–1.24)
GFR calc Af Amer: 26 mL/min — ABNORMAL LOW (ref >60)
GFR calc non Af Amer: 23 mL/min — ABNORMAL LOW (ref >60)
Glucose, Bld: 265 mg/dL — ABNORMAL HIGH (ref 65–99)
Potassium: 4.4 mmol/L (ref 3.5–5.1)
Sodium: 136 mmol/L (ref 135–145)
Total Bilirubin: 0.6 mg/dL (ref 0.3–1.2)
Total Protein: 6.7 g/dL (ref 6.5–8.1)

## 2017-03-11 LAB — BRAIN NATRIURETIC PEPTIDE: B Natriuretic Peptide: 1836 pg/mL — ABNORMAL HIGH (ref 0.0–100.0)

## 2017-03-11 LAB — I-STAT TROPONIN, ED: Troponin i, poc: 5.37 ng/mL (ref 0.00–0.08)

## 2017-03-11 LAB — MRSA PCR SCREENING: MRSA by PCR: NEGATIVE

## 2017-03-11 LAB — LACTIC ACID, PLASMA
Lactic Acid, Venous: 4 mmol/L (ref 0.5–1.9)
Lactic Acid, Venous: 2 mmol/L (ref 0.5–1.9)
Lactic Acid, Venous: 1.9 mmol/L (ref 0.5–1.9)

## 2017-03-11 LAB — I-STAT CHEM 8, ED
BUN: 57 mg/dL — ABNORMAL HIGH (ref 6–20)
Calcium, Ion: 1.1 mmol/L — ABNORMAL LOW (ref 1.15–1.40)
Chloride: 105 mmol/L (ref 101–111)
Creatinine, Ser: 2.9 mg/dL — ABNORMAL HIGH (ref 0.61–1.24)
Glucose, Bld: 259 mg/dL — ABNORMAL HIGH (ref 65–99)
HCT: 35 % — ABNORMAL LOW (ref 39.0–52.0)
Hemoglobin: 11.9 g/dL — ABNORMAL LOW (ref 13.0–17.0)
Potassium: 4.3 mmol/L (ref 3.5–5.1)
Sodium: 136 mmol/L (ref 135–145)
TCO2: 18 mmol/L (ref 0–100)

## 2017-03-11 LAB — PROCALCITONIN: Procalcitonin: 2.27 ng/mL

## 2017-03-11 LAB — HEPARIN LEVEL (UNFRACTIONATED): Heparin Unfractionated: 0.34 IU/mL (ref 0.30–0.70)

## 2017-03-11 LAB — D-DIMER, QUANTITATIVE: D-Dimer, Quant: 0.48 ug/mL-FEU (ref 0.00–0.50)

## 2017-03-11 MED ORDER — IPRATROPIUM-ALBUTEROL 0.5-2.5 (3) MG/3ML IN SOLN
RESPIRATORY_TRACT | Status: AC
  Administered 2017-03-11: 3 mL
  Filled 2017-03-11: qty 3

## 2017-03-11 MED ORDER — NITROGLYCERIN IN D5W 200-5 MCG/ML-% IV SOLN: 5.0000 ug/min | INTRAVENOUS | Status: DC

## 2017-03-11 MED ORDER — ROSUVASTATIN CALCIUM 10 MG PO TABS
5.0000 mg | ORAL_TABLET | Freq: Every day | ORAL | Status: DC
  Administered 2017-03-12 – 2017-03-26 (×14): 5 mg via ORAL
  Filled 2017-03-11 (×14): qty 1

## 2017-03-11 MED ORDER — DEXTROSE 5 % IV SOLN
500.0000 mg | Freq: Once | INTRAVENOUS | Status: AC
  Administered 2017-03-11: 500 mg via INTRAVENOUS
  Filled 2017-03-11: qty 500

## 2017-03-11 MED ORDER — SODIUM CHLORIDE 0.9% FLUSH: 3.0000 mL | INTRAVENOUS | Status: DC | PRN

## 2017-03-11 MED ORDER — HYDRALAZINE HCL 20 MG/ML IJ SOLN: 10.0000 mg | Freq: Four times a day (QID) | INTRAMUSCULAR | Status: DC | PRN

## 2017-03-11 MED ORDER — ONDANSETRON HCL 4 MG/2ML IJ SOLN: 4.0000 mg | Freq: Four times a day (QID) | INTRAMUSCULAR | Status: DC | PRN

## 2017-03-11 MED ORDER — ASPIRIN EC 81 MG PO TBEC
81.0000 mg | DELAYED_RELEASE_TABLET | Freq: Every day | ORAL | Status: DC
  Administered 2017-03-12 – 2017-03-15 (×4): 81 mg via ORAL
  Filled 2017-03-11 (×4): qty 1

## 2017-03-11 MED ORDER — INSULIN ASPART 100 UNIT/ML ~~LOC~~ SOLN
0.0000 [IU] | Freq: Three times a day (TID) | SUBCUTANEOUS | Status: DC
  Administered 2017-03-12 – 2017-03-13 (×3): 2 [IU] via SUBCUTANEOUS
  Administered 2017-03-14 – 2017-03-15 (×4): 1 [IU] via SUBCUTANEOUS
  Administered 2017-03-16: 3 [IU] via SUBCUTANEOUS
  Administered 2017-03-16: 1 [IU] via SUBCUTANEOUS
  Administered 2017-03-17: 5 [IU] via SUBCUTANEOUS
  Administered 2017-03-17: 2 [IU] via SUBCUTANEOUS
  Administered 2017-03-18: 1 [IU] via SUBCUTANEOUS
  Administered 2017-03-18: 2 [IU] via SUBCUTANEOUS
  Administered 2017-03-19 – 2017-03-20 (×2): 3 [IU] via SUBCUTANEOUS
  Administered 2017-03-20 – 2017-03-21 (×3): 2 [IU] via SUBCUTANEOUS
  Administered 2017-03-21: 3 [IU] via SUBCUTANEOUS

## 2017-03-11 MED ORDER — DEXTROSE 5 % IV SOLN
1.0000 g | Freq: Once | INTRAVENOUS | Status: AC
  Administered 2017-03-11: 1 g via INTRAVENOUS
  Filled 2017-03-11: qty 10

## 2017-03-11 MED ORDER — DEXTROSE 5 % IV SOLN: 500.0000 mg | INTRAVENOUS | Status: DC

## 2017-03-11 MED ORDER — IPRATROPIUM-ALBUTEROL 0.5-2.5 (3) MG/3ML IN SOLN
RESPIRATORY_TRACT | Status: AC
  Filled 2017-03-11: qty 3

## 2017-03-11 MED ORDER — HEPARIN (PORCINE) IN NACL 100-0.45 UNIT/ML-% IJ SOLN
1200.0000 [IU]/h | INTRAMUSCULAR | Status: DC
  Administered 2017-03-11: 1200 [IU]/h via INTRAVENOUS
  Filled 2017-03-11: qty 250

## 2017-03-11 MED ORDER — DEXTROSE 5 % IV SOLN
500.0000 mg | INTRAVENOUS | Status: AC
  Administered 2017-03-12 – 2017-03-14 (×3): 500 mg via INTRAVENOUS
  Filled 2017-03-11 (×3): qty 500

## 2017-03-11 MED ORDER — SODIUM CHLORIDE 0.9% FLUSH
3.0000 mL | Freq: Two times a day (BID) | INTRAVENOUS | Status: DC
  Administered 2017-03-11: 3 mL via INTRAVENOUS
  Administered 2017-03-11: 16:00:00 via INTRAVENOUS
  Administered 2017-03-12 – 2017-03-17 (×9): 3 mL via INTRAVENOUS

## 2017-03-11 MED ORDER — DEXTROSE 5 % IV SOLN
1.0000 g | INTRAVENOUS | Status: DC
  Administered 2017-03-12 – 2017-03-14 (×3): 1 g via INTRAVENOUS
  Filled 2017-03-11 (×4): qty 10

## 2017-03-11 MED ORDER — NITROGLYCERIN IN D5W 200-5 MCG/ML-% IV SOLN
5.0000 ug/min | Freq: Once | INTRAVENOUS | Status: AC
  Administered 2017-03-11: 5 ug/min via INTRAVENOUS
  Filled 2017-03-11: qty 250

## 2017-03-11 MED ORDER — HEPARIN BOLUS VIA INFUSION
4000.0000 [IU] | Freq: Once | INTRAVENOUS | Status: AC
  Administered 2017-03-11: 4000 [IU] via INTRAVENOUS

## 2017-03-11 MED ORDER — SODIUM CHLORIDE 0.9 % IV SOLN: 250.0000 mL | INTRAVENOUS | Status: DC | PRN

## 2017-03-11 MED ORDER — IPRATROPIUM-ALBUTEROL 0.5-2.5 (3) MG/3ML IN SOLN
3.0000 mL | Freq: Three times a day (TID) | RESPIRATORY_TRACT | Status: DC
  Administered 2017-03-12 – 2017-03-16 (×12): 3 mL via RESPIRATORY_TRACT
  Filled 2017-03-11 (×14): qty 3

## 2017-03-11 MED ORDER — DEXTROSE 5 % IV SOLN: 1.0000 g | INTRAVENOUS | Status: DC

## 2017-03-11 MED ORDER — HEPARIN (PORCINE) IN NACL 100-0.45 UNIT/ML-% IJ SOLN
1300.0000 [IU]/h | INTRAMUSCULAR | Status: DC
  Administered 2017-03-12: 1300 [IU]/h via INTRAVENOUS
  Filled 2017-03-11: qty 250

## 2017-03-11 MED ORDER — LEVALBUTEROL HCL 0.63 MG/3ML IN NEBU: 0.6300 mg | INHALATION_SOLUTION | RESPIRATORY_TRACT | Status: DC | PRN

## 2017-03-11 MED ORDER — ASPIRIN EC 81 MG PO TBEC: 81.0000 mg | DELAYED_RELEASE_TABLET | Freq: Every day | ORAL | Status: DC

## 2017-03-11 MED ORDER — ACETAMINOPHEN 325 MG PO TABS
650.0000 mg | ORAL_TABLET | ORAL | Status: DC | PRN
  Administered 2017-03-11: 650 mg via ORAL
  Filled 2017-03-11: qty 2

## 2017-03-11 MED ORDER — FUROSEMIDE 10 MG/ML IJ SOLN
40.0000 mg | Freq: Once | INTRAMUSCULAR | Status: AC
  Administered 2017-03-11: 40 mg via INTRAVENOUS
  Filled 2017-03-11: qty 4

## 2017-03-11 MED ORDER — ASPIRIN EC 325 MG PO TBEC
325.0000 mg | DELAYED_RELEASE_TABLET | Freq: Once | ORAL | Status: AC
  Administered 2017-03-11: 325 mg via ORAL
  Filled 2017-03-11: qty 1

## 2017-03-11 MED ORDER — FUROSEMIDE 10 MG/ML IJ SOLN
40.0000 mg | Freq: Two times a day (BID) | INTRAMUSCULAR | Status: DC
  Administered 2017-03-11 – 2017-03-17 (×12): 40 mg via INTRAVENOUS
  Filled 2017-03-11 (×13): qty 4

## 2017-03-11 NOTE — Progress Notes (Signed)
Pharmacy Antibiotic Note  Douglas Cannon is a 61 y.o. male admitted on 03/11/2017 with pneumonia.  Pharmacy has been consulted for rocephin and azithromycin dosing. Initial doses given in the ED  Plan: Cont azithromycin 500 mg IV q24 hours Cont rocephin 1 gm IV q24 hours Pharmacy will sign off.  Please advise if we can be of further assistance  Height:  (177.8 cm) Weight: 170 lb (77.1 kg) IBW/kg (Calculated) : 73  No data recorded.   Recent Labs Lab 03/11/17 0920 03/11/17 0922  WBC 27.3*  --   CREATININE 2.86* 2.90*  LATICACIDVEN 4.0*  --     Estimated Creatinine Clearance: 28.3 mL/min (A) (by C-G formula based on SCr of 2.9 mg/dL (H)).    No Known Allergies   Thank you for allowing pharmacy to be a part of this patient's care.  Talbert Cage Poteet 03/11/2017 10:13 AM

## 2017-03-11 NOTE — Significant Event (Addendum)
Rapid Response Event Note  Overview: Time Called: 1447 Arrival Time: 1455 Event Type: Respiratory  Initial Focused Assessment: Patient arrived via [facility] from [facility] ED. Patient on Bipap 80% fio2.   123/87  SR 99 RR 22 O2 sat 96% Lung sounds with crackles in bases Patient is fully alert and orient.  He is active and talkative. Desat with activity.   Heparin and NTG gtt infusing  Interventions: Transitioned to Unit Bipap, placed on 65% fio2,  O2 sat 85% Increased O2 to 100% Fio2  16/9 O2 sats 91-96%  lasix given IV Encouraged patient to rest quietly.  Dr Melynda Ripple and Dr Molli Knock at bedside to assess patient.  Lab at bedside to draw labs  Plan of Care (if not transferred): Rn to call if patient becomes restless, confused, or has decreased LOC Will continue to monitor patient  1800 attempted on HFNC 15L,  O2 sats decreased to 80% in about 5 min. Given PO meds  Placed back on Bipap  Able to wean FiO2 to 85%   O2 sat 95%  Event Summary: Name of Physician Notified: Dr Melynda Ripple at 1450  Name of Consulting Physician Notified: Dr Molli Knock at 1450  Outcome: Stayed in room and stabalized  Event End Time: 1610  Douglas Cannon

## 2017-03-11 NOTE — ED Provider Notes (Signed)
[facility]Y DEPT Provider Note   CSN: [id number] Arrival date & time: 03/11/17  0900     History   Chief Complaint Chief Complaint  Patient presents with  . Shortness of Breath    HPI Douglas Cannon is a 61 y.o. male.  HPI  Pt was seen at 0925. Per pt, c/o gradual onset and worsening of persistent SOB, cough and pedal edema for the past 2 days. Pt states the SOB worsens when he lays flat or walks. Pt went to his PMD's office today for evaluation, then was sent to the ED "because his O2 Sats were in the 50's." Denies fevers, no CP/palpitations, no abd pain, no N/V/D, no back pain.   Past Medical History:  Diagnosis Date  . Anemia   . Anxiety   . CHF (congestive heart failure) (HCC)   . Diabetes mellitus without complication (HCC)   . Hyperlipemia   . Hypertension   . Osteomyelitis [facility])     Patient Active Problem List   Diagnosis Date Noted  . Acute diastolic heart failure (HCC) 12/23/2015  . Osteomyelitis of right foot (HCC) 12/22/2015  . CAP (community acquired pneumonia) 12/19/2015  . Acute renal injury (HCC) 12/19/2015  . Bronchospasm 12/19/2015  . Acute respiratory failure (HCC) 12/19/2015  . Diabetes mellitus without complication (HCC)   . Hypertension     Past Surgical History:  Procedure Laterality Date  . AMPUTATION Right 12/24/2015   Procedure: PARTIAL THIRD RAY AMPUTATION OF THE RIGHT FOOT (AMPUTATION OF THE THIRD TOE AND A PORTION OF THE THIRD METATARSAL);  Surgeon: Ferman Hamming, DPM;  Location: [facility] ORS;  Service: Podiatry;  Laterality: Right;  . NO PAST SURGERIES         Home Medications    Prior to Admission medications   Medication Sig Start Date End Date Taking? Authorizing Provider  aspirin EC 81 MG tablet Take 81 mg by mouth daily.    Historical Provider, MD  cefTRIAXone 2 g in dextrose 5 % 50 mL Inject 2 g into the vein daily. Until 02/06/16 12/26/15   Erick Blinks, MD  citalopram (CELEXA) 20 MG tablet Take 20 mg by mouth daily.     Historical Provider, MD  Cyanocobalamin (B-12 PO) Take 1 tablet by mouth daily.    Historical Provider, MD  furosemide (LASIX) 40 MG tablet Take 1 tablet (40 mg total) by mouth daily. Start on 2/14 12/26/15   Erick Blinks, MD  glimepiride (AMARYL) 4 MG tablet Take 4 mg by mouth daily with breakfast.    Historical Provider, MD  Linagliptin-Metformin HCl (JENTADUETO) 2.03-999 MG TABS Take 1 tablet by mouth 2 (two) times daily.    Historical Provider, MD  metoprolol tartrate (LOPRESSOR) 25 MG tablet Take 1 tablet (25 mg total) by mouth 2 (two) times daily. 12/26/15   Erick Blinks, MD  Multiple Vitamin (MULTIVITAMIN WITH MINERALS) TABS tablet Take 1 tablet by mouth daily.    Historical Provider, MD  oxyCODONE-acetaminophen (ROXICET) 5-325 MG tablet Take 1-2 tablets by mouth every 4 (four) hours as needed for severe pain. 12/26/15   Erick Blinks, MD    Family History History reviewed. No pertinent family history.  Social History Social History  Substance Use Topics  . Smoking status: Never Smoker  . Smokeless tobacco: Never Used  . Alcohol use No     Allergies   Patient has no known allergies.   Review of Systems Review of Systems ROS: Statement: All systems negative except as marked or noted in the HPI;  Constitutional: Negative for fever and chills. ; ; Eyes: Negative for eye pain, redness and discharge. ; ; ENMT: Negative for ear pain, hoarseness, nasal congestion, sinus pressure and sore throat. ; ; Cardiovascular: Negative for chest pain, palpitations, diaphoresis, +dyspnea and peripheral edema. ; ; Respiratory: +cough. Negative for wheezing and stridor. ; ; Gastrointestinal: Negative for nausea, vomiting, diarrhea, abdominal pain, blood in stool, hematemesis, jaundice and rectal bleeding. ; ; Genitourinary: Negative for dysuria, flank pain and hematuria. ; ; Musculoskeletal: Negative for back pain and neck pain. Negative for swelling and trauma.; ; Skin: Negative for pruritus, rash,  abrasions, blisters, bruising and skin lesion.; ; Neuro: Negative for headache, lightheadedness and neck stiffness. Negative for weakness, altered level of consciousness, altered mental status, extremity weakness, paresthesias, involuntary movement, seizure and syncope.       Physical Exam Updated Vital Signs BP 107/80   Pulse (!) 109   Resp (!) 22   Ht  (1.778 m)   Wt 170 lb (77.1 kg)   SpO2 (!) 84%   BMI 24.39 kg/m    Patient Vitals for the past 24 hrs:  BP Temp Temp src Pulse Resp SpO2 Height Weight  03/11/17 1200 107/85 - - (!) 105 (!) 9 90 % - -  03/11/17 1157 - 98.2 F (36.8 C) Axillary (!) 105 18 93 % - -  03/11/17 1130 (!) 122/95 - - (!) 107 20 (!) 85 % - -  03/11/17 1100 108/81 - - 100 17 93 % - -  03/11/17 1030 108/83 - - (!) 107 (!) 22 (!) 84 % - -  03/11/17 1006 - - - - - 96 % - -  03/11/17 0930 107/80 - - (!) 109 (!) 22 (!) 84 % - -  03/11/17 0915 106/77 - - (!) 109 (!) 24 (!) 54 %  (1.778 m) 170 lb (77.1 kg)     Physical Exam 0930: Physical examination:  Nursing notes reviewed; Vital signs and O2 SAT reviewed;  Constitutional: Well developed, Well nourished, Well hydrated, In no acute distress; Head:  Normocephalic, atraumatic; Eyes: EOMI, PERRL, No scleral icterus; ENMT: Mouth and pharynx normal, Mucous membranes moist; Neck: Supple, Full range of motion, No lymphadenopathy; Cardiovascular: Tachycardic rate and rhythm, No gallop; Respiratory: Breath sounds coarse & equal bilaterally, No wheezes.  Speaking full sentences with ease, Normal respiratory effort/excursion Mild tachypnea.; Chest: Nontender, Movement normal; Abdomen: Soft, Nontender, Nondistended, Normal bowel sounds; Genitourinary: No CVA tenderness; Extremities: Pulses normal, No tenderness, +2 pedal edema, No calf asymmetry.; Neuro: AA&Ox3, Major CN grossly intact.  Speech clear. No gross focal motor or sensory deficits in extremities.; Skin: Color normal, Warm, Dry.    ED Treatments /  Results  Labs (all labs ordered are listed, but only abnormal results are displayed)   EKG  EKG Interpretation  Date/Time:  Friday March 11 2017 09:13:01 EDT Ventricular Rate:  110 PR Interval:    QRS Duration: 88 QT Interval:  319 QTC Calculation: 432 R Axis:   14 Text Interpretation:  Sinus tachycardia Low voltage, precordial leads Abnormal R-wave progression, early transition Repol abnrm suggests ischemia, diffuse leads Baseline wander No old tracing to compare Confirmed by [facility]  MD, Nicholos Johns [phone]) on 03/11/2017 10:05:08 AM       Radiology   Procedures Procedures (including critical care time)  Medications Ordered in ED Medications - No data to display   Initial Impression / Assessment and Plan / ED Course  I have reviewed the triage vital signs and  the nursing notes.  Pertinent labs & imaging results that were available during my care of the patient were reviewed by me and considered in my medical decision making (see chart for details).  MDM Reviewed: previous chart, nursing note and vitals Reviewed previous: labs and ECG Interpretation: labs, ECG and x-ray Total time providing critical care: 75-105 minutes. This excludes time spent performing separately reportable procedures and services. Consults: cardiology   CRITICAL CARE Performed by: Laray Anger Total critical care time: 90 minutes Critical care time was exclusive of separately billable procedures and treating other patients. Critical care was necessary to treat or prevent imminent or life-threatening deterioration. Critical care was time spent personally by me on the following activities: development of treatment plan with patient and/or surrogate as well as nursing, discussions with consultants, evaluation of patient's response to treatment, examination of patient, obtaining history from patient or surrogate, ordering and performing treatments and interventions, ordering and review of laboratory  studies, ordering and review of radiographic studies, pulse oximetry and re-evaluation of patient's condition.  Results for orders placed or performed during the hospital encounter of 03/11/17  Culture, blood (routine x 2)  Result Value Ref Range   Specimen Description BLOOD LEFT ARM    Special Requests      BOTTLES DRAWN AEROBIC AND ANAEROBIC Blood Culture results may not be optimal due to an inadequate volume of blood received in culture bottles   Culture PENDING    Report Status PENDING   Culture, blood (routine x 2)  Result Value Ref Range   Specimen Description BLOOD LEFT HAND    Special Requests      BOTTLES DRAWN AEROBIC AND ANAEROBIC Blood Culture results may not be optimal due to an inadequate volume of blood received in culture bottles   Culture PENDING    Report Status PENDING   CBC with Differential  Result Value Ref Range   WBC 27.3 (H) 4.0 - 10.5 K/uL   RBC 4.05 (L) 4.22 - 5.81 MIL/uL   Hemoglobin 12.4 (L) 13.0 - 17.0 g/dL   HCT 16.1 (L) 09.6 - 04.5 %   MCV 90.4 78.0 - 100.0 fL   MCH 30.6 26.0 - 34.0 pg   MCHC 33.9 30.0 - 36.0 g/dL   RDW 40.9 81.1 - 91.4 %   Platelets 289 150 - 400 K/uL   Neutrophils Relative % 91 %   Neutro Abs 24.9 (H) 1.7 - 7.7 K/uL   Lymphocytes Relative 5 %   Lymphs Abs 1.3 0.7 - 4.0 K/uL   Monocytes Relative 4 %   Monocytes Absolute 1.1 (H) 0.1 - 1.0 K/uL   Eosinophils Relative 0 %   Eosinophils Absolute 0.0 0.0 - 0.7 K/uL   Basophils Relative 0 %   Basophils Absolute 0.0 0.0 - 0.1 K/uL   WBC Morphology MORPHOLOGY UNREMARKABLE   Comprehensive metabolic panel  Result Value Ref Range   Sodium 136 135 - 145 mmol/L   Potassium 4.4 3.5 - 5.1 mmol/L   Chloride 102 101 - 111 mmol/L   CO2 17 (L) 22 - 32 mmol/L   Glucose, Bld 265 (H) 65 - 99 mg/dL   BUN 64 (H) 6 - 20 mg/dL   Creatinine, Ser 7.82 (H) 0.61 - 1.24 mg/dL   Calcium 8.7 (L) 8.9 - 10.3 mg/dL   Total Protein 6.7 6.5 - 8.1 g/dL   Albumin 3.1 (L) 3.5 - 5.0 g/dL   AST 70 (H) 15 - 41  U/L   ALT 18 17 - 63  U/L   Alkaline Phosphatase 50 38 - 126 U/L   Total Bilirubin 0.6 0.3 - 1.2 mg/dL   GFR calc non Af Amer 23 (L) >60 mL/min   GFR calc Af Amer 26 (L) >60 mL/min   Anion gap 17 (H) 5 - 15  Lactic acid, plasma  Result Value Ref Range   Lactic Acid, Venous 4.0 (HH) 0.5 - 1.9 mmol/L  Lactic acid, plasma  Result Value Ref Range   Lactic Acid, Venous 2.0 (HH) 0.5 - 1.9 mmol/L  Blood gas, arterial ([facility] & [facility] ONLY)  Result Value Ref Range   FIO2 1.00    O2 Content 15.0 L/min   Delivery systems OXYGEN MASK    pH, Arterial 7.404 7.350 - 7.450   pCO2 arterial 25.3 (L) 32.0 - 48.0 mmHg   pO2, Arterial 39.3 (LL) 83.0 - 108.0 mmHg   Bicarbonate 17.9 (L) 20.0 - 28.0 mmol/L   Acid-base deficit 8.3 (H) 0.0 - 2.0 mmol/L   O2 Saturation 69.9 %   Patient temperature 37.0    Collection site LEFT RADIAL    Drawn by [id number]    Sample type ARTERIAL DRAW    Allens test (pass/fail) PASS PASS  Brain natriuretic peptide  Result Value Ref Range   B Natriuretic Peptide 1,836.0 (H) 0.0 - 100.0 pg/mL  Blood gas, arterial  Result Value Ref Range   FIO2 0.55    Delivery systems VENTILATOR    Mode BILEVEL POSITIVE AIRWAY PRESSURE    LHR 15.0 resp/min   Inspiratory PAP 12.0    Expiratory PAP 6.0    pH, Arterial 7.423 7.350 - 7.450   pCO2 arterial 26.3 (L) 32.0 - 48.0 mmHg   pO2, Arterial 52.6 (L) 83.0 - 108.0 mmHg   Bicarbonate 19.3 (L) 20.0 - 28.0 mmol/L   Acid-base deficit 6.7 (H) 0.0 - 2.0 mmol/L   O2 Saturation 85.3 %   Patient temperature 37.0    Collection site LEFT RADIAL    Drawn by [id number]    Sample type ARTERIAL DRAW    Allens test (pass/fail) PASS PASS  I-Stat Chem 8, ED  Result Value Ref Range   Sodium 136 135 - 145 mmol/L   Potassium 4.3 3.5 - 5.1 mmol/L   Chloride 105 101 - 111 mmol/L   BUN 57 (H) 6 - 20 mg/dL   Creatinine, Ser 2.95 (H) 0.61 - 1.24 mg/dL   Glucose, Bld 621 (H) 65 - 99 mg/dL   Calcium, Ion 3.08 (L) 1.15 - 1.40 mmol/L   TCO2 18 0 - 100 mmol/L    Hemoglobin 11.9 (L) 13.0 - 17.0 g/dL   HCT 65.7 (L) 84.6 - 96.2 %  I-stat troponin, ED  Result Value Ref Range   Troponin i, poc 5.37 (HH) 0.00 - 0.08 ng/mL   Comment NOTIFIED PHYSICIAN    Comment 3           Dg Chest Portable 1 View Result Date: 03/11/2017 CLINICAL DATA:  Shortness of breath and cough EXAM: PORTABLE CHEST 1 VIEW COMPARISON:  December 26, 2015 FINDINGS: There is airspace consolidation throughout much of the right mid and lower lung zone. There is interstitial edema underlying, primarily in the perihilar regions. There is patchy alveolar edema in the left mid lower lung zones. Heart is enlarged with pulmonary venous hypertension. No adenopathy. There is a small right pleural effusion, layering. IMPRESSION: Evidence a degree of underlying congestive heart failure. Suspect pneumonia throughout the right mid and lower lung zones superimposed. No adenopathy evident.  Electronically Signed   By: Bretta Bang III M.D.   On: 03/11/2017 09:43    Results for GUIDO, COMP (MRN [id number]) as of 03/11/2017 10:00  Ref. Range 12/23/2015 06:01 12/24/2015 05:32 12/25/2015 05:57 12/26/2015 08:30 03/11/2017 09:20 03/11/2017 09:22  BUN Latest Ref Range: 6 - 20 mg/dL 69 (H) 52 (H) 38 (H) 33 (H) 64 (H) 57 (H)  Creatinine Latest Ref Range: 0.61 - 1.24 mg/dL 4.09 (H) 8.11 (H) 9.14 (H) 1.51 (H) 2.86 (H) 2.90 (H)     1025:  The patient is noted to have a lactate>4. With the current information available to me, I don't think the patient is in septic shock. The lactate>4, is related to respiratory distress/ respiratory failure  and AKI. ABG appears compensated; mixed resp alkalosis and metabolic acidosis. Will start bipap for CHF, as well as IV ntg and IV lasix. Will dose IV abx after BC obtained. Will dose IV heparin for elevated troponin (would also cover for possibility of PE). T/C to [facility] Cards Dr. Diona Browner, case discussed, including:  HPI, pertinent PM/SHx, VS/PE, dx testing, ED course and treatment:  Agrees  with ED workup, BUN/Cr elevated so pt will not have emergent cardiac cath at this time, requests to admit to [facility], transfer to [facility] stepdown, obtain Cards MD consult there.  1045:  T/C to [facility] Dr. Marland Mcalpine, case discussed, including:  HPI, pertinent PM/SHx, VS/PE, dx testing, ED course and treatment, as well as d/w Cards MD:  Agreeable to come to ED to evaluate pt after speaking with Cards MD.   1110:  [facility] MD refuses to admit pt and wants pt emergently intubated for hypoxia and to call PCCM at [facility] to admit. Pt is currently A&Ox3, talking through bipap to ED staff, waving at staff in hallway, NAD, resps without distress, Sats 88-91% on bipap. Given pt's stability, pt is likely chronically hypoxic. No indication for emergent intubation at this time.   1145:  T/C to [facility] P[facility] Dr. Molli Knock, case discussed, including:  HPI, pertinent PM/SHx, VS/PE, dx testing, ED course and treatment:  Agrees with ED treatment and original plan of care, agrees with EDP's decision not to intubate and also expressly reiterates NOT to intubate pt for hypoxia because it is likely chronic.  Pt's Sats now 90-93% on bipap, resps easy, pt appears NAD. Continues to deny CP.   1200:  T/C to [facility] Dr. Marland Mcalpine, case discussed, including:  HPI, pertinent PM/SHx, VS/PE, dx testing, ED course and treatment, as well as d/w PCCM MD: agreeable to transfer pt to [facility].      Final Clinical Impressions(s) / ED Diagnoses   Final diagnoses:  None    New Prescriptions New Prescriptions   No medications on file     Samuel Jester, DO 03/16/17 1912

## 2017-03-11 NOTE — ED Notes (Signed)
Report given to [facility] at this time.   

## 2017-03-11 NOTE — Progress Notes (Signed)
   Patient transferred from [facility].  Discussed with Dr. Diona Browner.  Respiratory failure and CKD IV.  Also with a NSTEMI.  However, he is pain free and would be high risk for permanent renal failure at this point with cardiac cath.  Discussed with the patient.  Continue with medical management.

## 2017-03-11 NOTE — ED Notes (Signed)
CRITICAL VALUE ALERT  Critical value received:  Lactic 4.0  Date of notification:  4/27  Time of notification:  0953  Critical value read back:Yes.    Nurse who received alert:  Viviano Simas, RN  MD notified (1st page):  Clarene Duke

## 2017-03-11 NOTE — H&P (Addendum)
History and Physical    Douglas Cannon:[id number] DOB: 10/01/1956 DOA: 03/11/2017  PCP: Dwana Melena, MD   Patient coming from: Home via PCP Office  Chief Complaint: Shortness of Breath and Leg Swelling for 2 days  HPI: Douglas Cannon is a 61 y.o. male with medical history significant of HTN, HLD, DM Type 2, BPH, CKD Stage 3, Anemia, Anxiety, and ?Diastolic HF with other comorbids who presented to the ED driven by an office staff member from PCP's office. Patient states he was working at [employer] and came back and Insidious onset of SOB and Leg swelling for two days. Prior to that patient states he was in his normal state of health. Of note it is documented that the patient has Diastolic Heart Failure and takes Lasix prn. He states that he came to be evaluated at his PCP's office and when they checked a Pulse Oximetry was in the 50's. Patient was brought to the ED and evaluated and was found to have Acute Respiratory Failure with suspected CHF Exacerbation with underlying PNA. He was placed on BiPAP and I examined the patient while he was on BiPAP. States he was not able to lay flat and has had a mild cough but has not been productive. Denied any chest pain, Nausea, Vomiting, Lightheadedness dizziness, and no blood in stools. Denied any other complaints at this time. [facility] was asked to evaluate and admit.   ED Course: Had CXR done, Bloodwork done including POC Troponin (5.37) BNP (1,800) was placed on BiPAP, Heparin gtt, Nitro gtt, given empiric Abx with Ceftriaxone/Azithromycin, and Diuresed with IV Lasix 40 mg.   Review of Systems: As per HPI otherwise 10 point review of systems negative. Denied Headaches, Blurred Vision, Constipation, Diarrhea, Urinary difficulties and No CP.   Past Medical History:  Diagnosis Date  . Anemia   . Anxiety   . CKD (chronic kidney disease) stage 3, GFR 30-59 ml/min   . Diastolic heart failure [facility])    Episode December 2017 in the setting of pneumonia  . Essential  hypertension   . History of pneumonia   . Hyperlipemia   . Osteomyelitis (HCC)   . Type 2 diabetes mellitus (HCC)    Past Surgical History:  Procedure Laterality Date  . AMPUTATION Right 12/24/2015   Procedure: PARTIAL THIRD RAY AMPUTATION OF THE RIGHT FOOT (AMPUTATION OF THE THIRD TOE AND A PORTION OF THE THIRD METATARSAL);  Surgeon: Ferman Hamming, DPM;  Location: [facility];  Service: Podiatry;  Laterality: Right;   SOCIAL HISTORY  reports that he has never smoked. He has never used smokeless tobacco. He reports that he does not drink alcohol or use drugs.  No Known Allergies  Family History  Problem Relation Age of Onset  . Adopted: Yes   Prior to Admission medications   Medication Sig Start Date End Date Taking? Authorizing Provider  amLODipine (NORVASC) 5 MG tablet Take 5 mg by mouth 2 (two) times daily.   Yes Historical Provider, MD  aspirin EC 81 MG tablet Take 81 mg by mouth daily.   Yes Historical Provider, MD  carvedilol (COREG) 6.25 MG tablet Take 6.25 mg by mouth 2 (two) times daily with a meal.   Yes Historical Provider, MD  citalopram (CELEXA) 20 MG tablet Take 20 mg by mouth daily.   Yes Historical Provider, MD  Cyanocobalamin (B-12 PO) Take 1 tablet by mouth daily.   Yes Historical Provider, MD  Cyanocobalamin 2500 MCG TABS Take 1 tablet by mouth daily.  Yes Historical Provider, MD  DM-Phenylephrine-Acetaminophen (ALKA-SELTZER PLS SINUS & COUGH PO) Take 1 tablet by mouth daily as needed.   Yes Historical Provider, MD  furosemide (LASIX) 40 MG tablet Take 1 tablet (40 mg total) by mouth daily. Start on 2/14 Patient taking differently: Take 40 mg by mouth daily as needed for fluid. Start on 2/14 12/26/15  Yes Erick Blinks, MD  glimepiride (AMARYL) 4 MG tablet Take 4 mg by mouth daily with breakfast.   Yes Historical Provider, MD  metFORMIN (GLUCOPHAGE) 500 MG tablet Take by mouth 2 (two) times daily with a meal.   Yes Historical Provider, MD  Pseudoephedrine-APAP-DM  (DAYQUIL MULTI-SYMPTOM PO) Take 2 tablets by mouth daily as needed.   Yes Historical Provider, MD  rosuvastatin (CRESTOR) 5 MG tablet Take 5 mg by mouth daily.   Yes Historical Provider, MD  tamsulosin (FLOMAX) 0.4 MG CAPS capsule Take 0.4 mg by mouth.   Yes Historical Provider, MD  triamcinolone ointment (KENALOG) 0.1 % Apply 1 application topically 2 (two) times daily.   Yes Historical Provider, MD  Zinc 50 MG TABS Take 1 tablet by mouth daily.   Yes Historical Provider, MD   Physical Exam: Vitals:   03/11/17 1230 03/11/17 1246 03/11/17 1300 03/11/17 1330  BP: 107/85  110/86 (!) 118/91  Pulse: (!) 102 (!) 103 98 (!) 102  Resp: 17 11 (!) 8 (!) 8  Temp:      TempSrc:      SpO2: 90% 93% 92% (!) 89%  Weight:      Height:       Constitutional: WN/WD Caucasian NAD and appears calm and on BiPAP Mask Eyes: Lids and conjunctivae normal, sclerae anicteric  ENMT: External Ears, Nose appear normal. Grossly normal hearing.  Neck: Appears normal, supple, no cervical masses, normal ROM, no appreciable thyromegaly; JVD noted Respiratory: Diminished to auscultation bilaterally with crackles and some diffuse rhonchi on Right. Slightly increased respiratory effort with tachypena. Patient is wearing BiPAP Cardiovascular: Tachycardic Rate and Rhythm, no appreciable murmurs / rubs / gallops. S1 and S2 auscultated. 2+ lower extremity pitting edema.  Abdomen: Soft, non-tender, non-distended. No masses palpated. No appreciable hepatosplenomegaly. Bowel sounds positive x4.  GU: Deferred. Musculoskeletal: No clubbing / cyanosis of digits/nails. No joint deformity upper and lower extremities.  Skin: No rashes, lesions, ulcers on limited skin evaluation. No induration; Warm and dry.  Neurologic: CN 2-12 grossly intact with no focal deficits. Romberg sign cerebellar reflexes not assessed.  Psychiatric: Normal judgment and insight. Alert and oriented x 3. Normal mood and appropriate affect.   Labs on Admission: I  have personally reviewed following labs and imaging studies  CBC:  Recent Labs Lab 03/11/17 0920 03/11/17 0922  WBC 27.3*  --   NEUTROABS 24.9*  --   HGB 12.4* 11.9*  HCT 36.6* 35.0*  MCV 90.4  --   PLT 289  --    Basic Metabolic Panel:  Recent Labs Lab 03/11/17 0920 03/11/17 0922  NA 136 136  K 4.4 4.3  CL 102 105  CO2 17*  --   GLUCOSE 265* 259*  BUN 64* 57*  CREATININE 2.86* 2.90*  CALCIUM 8.7*  --    GFR: Estimated Creatinine Clearance: 28.3 mL/min (A) (by C-G formula based on SCr of 2.9 mg/dL (H)). Liver Function Tests:  Recent Labs Lab 03/11/17 0920  AST 70*  ALT 18  ALKPHOS 50  BILITOT 0.6  PROT 6.7  ALBUMIN 3.1*   No results for input(s): LIPASE, AMYLASE in  the last 168 hours. No results for input(s): AMMONIA in the last 168 hours. Coagulation Profile: No results for input(s): INR, PROTIME in the last 168 hours. Cardiac Enzymes: No results for input(s): CKTOTAL, CKMB, CKMBINDEX, TROPONINI in the last 168 hours. BNP (last 3 results) No results for input(s): PROBNP in the last 8760 hours. HbA1C: No results for input(s): HGBA1C in the last 72 hours. CBG: No results for input(s): GLUCAP in the last 168 hours. Lipid Profile: No results for input(s): CHOL, HDL, LDLCALC, TRIG, CHOLHDL, LDLDIRECT in the last 72 hours. Thyroid Function Tests: No results for input(s): TSH, T4TOTAL, FREET4, T3FREE, THYROIDAB in the last 72 hours. Anemia Panel: No results for input(s): VITAMINB12, FOLATE, FERRITIN, TIBC, IRON, RETICCTPCT in the last 72 hours. Urine analysis: No results found for: COLORURINE, APPEARANCEUR, LABSPEC, PHURINE, GLUCOSEU, HGBUR, BILIRUBINUR, KETONESUR, PROTEINUR, UROBILINOGEN, NITRITE, LEUKOCYTESUR Sepsis Labs: !!!!!!!!!!!!!!!!!!!!!!!!!!!!!!!!!!!!!!!!!!!! (procalcitonin:4,lacticidven:4) ) Recent Results (from the past 240 hour(s))  Culture, blood (routine x 2)     Status: None (Preliminary result)   Collection Time: 03/11/17  9:20  AM  Result Value Ref Range Status   Specimen Description BLOOD LEFT ARM  Final   Special Requests   Final    BOTTLES DRAWN AEROBIC AND ANAEROBIC Blood Culture results may not be optimal due to an inadequate volume of blood received in culture bottles   Culture PENDING  Incomplete   Report Status PENDING  Incomplete  Culture, blood (routine x 2)     Status: None (Preliminary result)   Collection Time: 03/11/17 10:22 AM  Result Value Ref Range Status   Specimen Description BLOOD LEFT HAND  Final   Special Requests   Final    BOTTLES DRAWN AEROBIC AND ANAEROBIC Blood Culture results may not be optimal due to an inadequate volume of blood received in culture bottles   Culture PENDING  Incomplete   Report Status PENDING  Incomplete    Radiological Exams on Admission: Dg Chest Portable 1 View  Result Date: 03/11/2017 CLINICAL DATA:  Shortness of breath and cough EXAM: PORTABLE CHEST 1 VIEW COMPARISON:  December 26, 2015 FINDINGS: There is airspace consolidation throughout much of the right mid and lower lung zone. There is interstitial edema underlying, primarily in the perihilar regions. There is patchy alveolar edema in the left mid lower lung zones. Heart is enlarged with pulmonary venous hypertension. No adenopathy. There is a small right pleural effusion, layering. IMPRESSION: Evidence a degree of underlying congestive heart failure. Suspect pneumonia throughout the right mid and lower lung zones superimposed. No adenopathy evident. Electronically Signed   By: Bretta Bang III M.D.   On: 03/11/2017 09:43   EKG: Independently reviewed. Showed Sinus Tachycardia with a rate of 110 and Lateral ST Depressions. No evidence of ST Elevation on my interpretation.   Assessment/Plan Principal Problem:   Acute respiratory failure (HCC) Active Problems:   CAP (community acquired pneumonia)   Diabetes mellitus without complication (HCC)   Hypertension   Acute renal injury (HCC)   Heart failure  (HCC)   Elevated troponin   NSTEMI (non-ST elevated myocardial infarction) (HCC)   SIRS (systemic inflammatory response syndrome) (HCC)   Acute renal failure superimposed on stage 3 chronic kidney disease (HCC)   Lactic acidosis   Abnormal LFTs   Type 2 diabetes mellitus with hyperlipidemia (HCC)   Volume overload  Acute Hypoxic Respiratory Failure requiring NIPPV with BiPAP likely secondary to suspected Heart Failure Decompensation/PossibleCardiogenic Shock (Will get ECHO) as well probable Community Acquired Pneumonia -C/w BiPAP and  wean as Tolerated -Maintain O2 Saturations >92% -IV Diuresis with 40 mg Lasix BID -PCCM called and stated not a candidate to come to CCU at this time and recommend Hospitalist admission and placement in SDU -Will need PCCM evaluation if patient further decompensates -Patient is at high risk for decompensation and intubation  Suspected Acute Decompensation of Heart Failure/Possible Cardiogenic Shock (Having lower Bps) causing Volume Overload -Patient's BNP was 1,836.0 on admission with 1-2+ Pitting Edema and Orthopena; Onset of Leg swelling and SOB was insidious -CXR showed Heart is enlarged with pulmonary venous hypertension. No adenopathy. There is a small right pleural effusion, layering. Evidence a degree of underlying Congestive Heart Failure -On NTG gtt and Heparin gtt -No ACE/ARB because of Renal Insufficiency -Repeat ECHOCardiogram; Last ECHOCardiogram in 12/2015 showed EF of 55-60% and not technically sufficient to allow evaluation of LV Diastolic Dysfunction but PA peak pressure was increased at 43 then.  -Cycle Troponins to r/o new Ischemia; POC was 5.37 -IV Diuresis with Lasix 40 mg BID -Strict I's and O's, SLIV, Daily Weights, and Fluid Restrict  Elevated Troponin/NSTEMI -POC Troponin was 5.37; EKG showed ST depression in the lateral leads -Cycle Cardiac Troponin I  -C/w Heparin gtt at this Time -ECHOCardiogram to Evaluate -Apperciate  Cardiology Consultation and Evaluation and further recommendations -Has Risk Factors with HTN, HLD, and DM Type 2 -C/w ASA 81 mg po and with Atorvastatin; Hold ACE/ARB because of Renal Insuffiencey and No BB because of Acute Decompensation   Right Lung Community Acquired PNA -Cannot r/o PNA on CXR; CXR read as suspect pneumonia throughout the right mid and lower lung zones superimposed. No adenopathy evident. -C/w Abx of IV Ceftriaxone and Azithromycin -Leukocytosis was 27.3 (Possibly reactive) -Check Strep and Urine Legionella Ag -C/w BiPAP  -Checked Blood Cx which are pending; Patient was afebrile on admission -Repeat CXR in AM  SIRS with Leukocytosis  -WBC Elevated at 27.3; HR was Tachycardic at 109, and RR was 24; Lactic Acid Level was 4.0 -Possibly Reactive to Suspected Heart Failure Decompensation/Cardiogenic Shock in the setting of PNA -Repeat CMP in AM  Acute on Chronic Kidney Disease Stage 3 -Has been seen by Nephrology Dr. Kristian Covey in consultation 2 weeks ago -Prior records show patient has had Cr values from 1.3-1.5 -BUN/Cr today was 64/2.86 -Avoid Nephrotoxics -C/w Diuresis with IV Lasix -Repeat CMP in AM  Hyperglycemia in the setting of Diabetes Mellitus Type 2 -Hold Home Glimepiride 4 mg po Daily and Metformin 500 mg po BID -Place on Sensitive Novolog SSI -Check HbA1c -Continue to Monitor CBG's  Lactic Acidosis -Hold Home Metformin -Trending Down as went from 4.0 -> 2.0 -Continue to Monitor and Trend Lactic Acid Levels  Hyperlipidemia -Repeat Lipid Panel -C/w Rosuvastatin 5 mg po Daily  Hypertension but currently running softer blood pressures -Hold Home Amlodipine 5 mg po BID, Carvedilol 6.25 mg po BID and Furosemide 40 mg po Dailyprn as well as Tamsulosin 0.4 mg po daily -Suspect Some form of Cardiomyopathy from possible ischemic event  Abnormal LFT's -AST elevated at 70 -Likely from Hypoprofusion to Liver -Continue to Trend and Repeat CMP in AM  DVT  prophylaxis: Anticoagulated with Heparin gtt  Code Status: FULL CODE Family Communication: No Family present at bedside Disposition Plan: Pending Clinical Course Consults called: Cardiology Admission status: Inpatient SDU at [facility], D.O. [facility] Pager [phone]  If 7PM-7AM, please contact night-coverage [email or web address] Password [email or web address]  03/11/2017, 2:06 PM

## 2017-03-11 NOTE — ED Notes (Signed)
CRITICAL VALUE ALERT  Critical value received:  Lactic Acid 2.0  Date of notification:  03/11/17  Time of notification:  1311  Critical value read back:yes  Nurse who received alert:  Rory Percy RN  MD notified (1st page):  Clarene Duke  Time of first page:  1312  Responding MD:  Clarene Duke  Time MD responded: [phone]

## 2017-03-11 NOTE — ED Notes (Signed)
CRITICAL VALUE ALERT  Critical value received:    PH 7.404 CO2 25 PO2 39 HCO3 17.9 Deficit of 8.3.  Sat 69.9   Date of notification:  03/11/17  Time of notification:  0942  Critical value read back:Yes.    Nurse who received alert:  Viviano Simas, RN  MD notified (1st page):  Clarene Duke

## 2017-03-11 NOTE — Consult Note (Signed)
Name: Douglas Cannon MRN: [id number] DOB: 1956-10-22    ADMISSION DATE:  03/11/2017 CONSULTATION DATE:  03/11/17  REFERRING MD :  Marland Mcalpine  CHIEF COMPLAINT:  SOB   HISTORY OF PRESENT ILLNESS:  Douglas Cannon is a 61 y.o. male with a PMH as outlined below.  He presented to [facility] on 4/27 with shortness of breath. He was working at [employer] roughly 4 hours away for the past several days. He began to have shortness of breath starting on roughly 4/25 and states that symptoms progressed since onset. When he returned to town, he went to PCP for further evaluation and while there, O2 saturation was noted to be in the 50s. He was subsequently brought to ED for further evaluation.  In ED, he was confirmed to have acute hypoxic respiratory failure. CXR was suggestive of volume overload and acute pulmonary edema with possible underlying pneumonia. He was started on BiPAP due to hypoxia and mild increased work of breathing. He was also started on nitroglycerin drip as well as heparin drip.  He was later transferred to [facility] for further management.  He denies any fevers/chills/sweats, cough, wheezing, hemoptysis, chest pain, N/V/D, abdominal pain, myalgias, exposures to known sick contacts. Denies any similar episodes in the past. Denies any history of any malignancies, clotting disorders, prolonged periods of immobilization. He states that he is very active every day.  Of note, prior echo from February 2017 shows EF 55-60%. Mild TR, PAP 43.  PAST MEDICAL HISTORY :   has a past medical history of Anemia; Anxiety; CKD (chronic kidney disease) stage 3, GFR 30-59 ml/min; Diastolic heart failure (HCC); Essential hypertension; History of pneumonia; Hyperlipemia; Osteomyelitis (HCC); and Type 2 diabetes mellitus (HCC).  has a past surgical history that includes Amputation (Right, 12/24/2015). Prior to Admission medications   Medication Sig Start Date End Date Taking? Authorizing Provider  amLODipine (NORVASC) 5  MG tablet Take 5 mg by mouth 2 (two) times daily.   Yes Historical Provider, MD  aspirin EC 81 MG tablet Take 81 mg by mouth daily.   Yes Historical Provider, MD  carvedilol (COREG) 6.25 MG tablet Take 6.25 mg by mouth 2 (two) times daily with a meal.   Yes Historical Provider, MD  citalopram (CELEXA) 20 MG tablet Take 20 mg by mouth daily.   Yes Historical Provider, MD  Cyanocobalamin (B-12 PO) Take 1 tablet by mouth daily.   Yes Historical Provider, MD  Cyanocobalamin 2500 MCG TABS Take 1 tablet by mouth daily.   Yes Historical Provider, MD  DM-Phenylephrine-Acetaminophen (ALKA-SELTZER PLS SINUS & COUGH PO) Take 1 tablet by mouth daily as needed.   Yes Historical Provider, MD  furosemide (LASIX) 40 MG tablet Take 1 tablet (40 mg total) by mouth daily. Start on 2/14 Patient taking differently: Take 40 mg by mouth daily as needed for fluid. Start on 2/14 12/26/15  Yes Erick Blinks, MD  glimepiride (AMARYL) 4 MG tablet Take 4 mg by mouth daily with breakfast.   Yes Historical Provider, MD  metFORMIN (GLUCOPHAGE) 500 MG tablet Take by mouth 2 (two) times daily with a meal.   Yes Historical Provider, MD  Pseudoephedrine-APAP-DM (DAYQUIL MULTI-SYMPTOM PO) Take 2 tablets by mouth daily as needed.   Yes Historical Provider, MD  rosuvastatin (CRESTOR) 5 MG tablet Take 5 mg by mouth daily.   Yes Historical Provider, MD  tamsulosin (FLOMAX) 0.4 MG CAPS capsule Take 0.4 mg by mouth.   Yes Historical Provider, MD  triamcinolone ointment (KENALOG) 0.1 %  Apply 1 application topically 2 (two) times daily.   Yes Historical Provider, MD  Zinc 50 MG TABS Take 1 tablet by mouth daily.   Yes Historical Provider, MD   No Known Allergies  FAMILY HISTORY:  family history is not on file. He was adopted. SOCIAL HISTORY:  reports that he has never smoked. He has never used smokeless tobacco. He reports that he does not drink alcohol or use drugs.  REVIEW OF SYSTEMS:   All negative; except for those that are bolded,  which indicate positives.  Constitutional: weight loss, weight gain, night sweats, fevers, chills, fatigue, weakness.  HEENT: headaches, sore throat, sneezing, nasal congestion, post nasal drip, difficulty swallowing, tooth/dental problems, visual complaints, visual changes, ear aches. Neuro: difficulty with speech, weakness, numbness, ataxia. CV:  chest pain, orthopnea, PND, swelling in lower extremities, dizziness, palpitations, syncope.  Resp: cough, hemoptysis, dyspnea, wheezing. GI: heartburn, indigestion, abdominal pain, nausea, vomiting, diarrhea, constipation, change in bowel habits, loss of appetite, hematemesis, melena, hematochezia.  GU: dysuria, change in color of urine, urgency or frequency, flank pain, hematuria. MSK: joint pain or swelling, decreased range of motion. Psych: change in mood or affect, depression, anxiety, suicidal ideations, homicidal ideations. Skin: rash, itching, bruising.  SUBJECTIVE:  Comfortable on BiPAP. Denies chest pain.  VITAL SIGNS: Temp:  [98.2 F (36.8 C)] 98.2 F (36.8 C) (04/27 1157) Pulse Rate:  [98-109] 99 (04/27 1525) Resp:  [8-24] 22 (04/27 1525) BP: (106-123)/(77-95) 123/87 (04/27 1525) SpO2:  [54 %-99 %] 96 % (04/27 1525) FiO2 (%):  [55 %-70 %] 65 % (04/27 1525) Weight:  [77.1 kg (170 lb)] 77.1 kg (170 lb) (04/27 0915)  PHYSICAL EXAMINATION: General: Adult male, resting comfortably, in NAD. Neuro: A&O x 3, non-focal.  HEENT: [place]/AT. PERRL, sclerae anicteric. BiPAP in place. Cardiovascular: RRR, no M/R/G.  Lungs: Respirations even and unlabored.  Coarse crackles bilaterally. Abdomen: BS x 4, soft, NT/ND.  Musculoskeletal: No gross deformities, 2+ edema bilaterally. Skin: Intact, warm, no rashes.    Recent Labs Lab 03/11/17 0920 03/11/17 0922  NA 136 136  K 4.4 4.3  CL 102 105  CO2 17*  --   BUN 64* 57*  CREATININE 2.86* 2.90*  GLUCOSE 265* 259*    Recent Labs Lab 03/11/17 0920 03/11/17 0922  HGB 12.4* 11.9*  HCT  36.6* 35.0*  WBC 27.3*  --   PLT 289  --    Dg Chest Portable 1 View  Result Date: 03/11/2017 CLINICAL DATA:  Shortness of breath and cough EXAM: PORTABLE CHEST 1 VIEW COMPARISON:  December 26, 2015 FINDINGS: There is airspace consolidation throughout much of the right mid and lower lung zone. There is interstitial edema underlying, primarily in the perihilar regions. There is patchy alveolar edema in the left mid lower lung zones. Heart is enlarged with pulmonary venous hypertension. No adenopathy. There is a small right pleural effusion, layering. IMPRESSION: Evidence a degree of underlying congestive heart failure. Suspect pneumonia throughout the right mid and lower lung zones superimposed. No adenopathy evident. Electronically Signed   By: Bretta Bang III M.D.   On: 03/11/2017 09:43    STUDIES:  CXR 4/27 > underlying CHF, possible pneumonia. Echo 4/27 > LE duplex 4/27 >  SIGNIFICANT EVENTS  4/27 > admitted.  ANTIBIOTICS: Azithromycin 4/27 >  Ceftriaxone 4/27 >  MICRO: Blood 4/27 >  Sputum 4/27 >   ASSESSMENT / PLAN:  Acute hypoxic respiratory failure - presumed due to acute CHF exacerbation with acute pulmonary edema, possible pneumonia.  Certainly some concern for PE/DVT given acute onset of symptoms along with LE edema, elevated troponin, tachycardia, tachypnea. Plan: Continue BiPAP for now. Supplemental O2 as needed when off BiPAP. Continue aggressive diuresis as BP and SCr permit. Continue nitro drip. Continue heparin drip for now. CTA restricted given AoCKD. Agree with empiric antibiotics for now.  Assess PCT algorithm - if low, d/c abx. Levalbuterol PRN. CXR in AM.   CHF with acute exacerbation (echo from February 2017 shows EF 55-60%. Mild TR, PAP 43). NSTEMI - though notes, troponin mildly misleading given CKD. Plan: Continue aggressive diuresis as BP and SCr permit. Continue nitro drip, heparin drip.  Aspirin. Trend troponins. Follow up on echo  results. Cardiology following. Continue preadmission ASA, rosuvastatin. Hold preadmission amlodipine, carvedilol.  AoCKD. Lactic acidosis - metformin + CKD. Plan: Continue diuresis. Trend lactate. Replace electrolytes as indicated.  Rest per primary team.   Rutherford Guys, PA - [facility] [place] Pager: [phone]  or [phone] 03/11/2017, 3:28 PM  Attending Note:  61 year old male with PMH of CHF who presents with 2 days history of SOB.  Patient presented to [facility]-ED with hypoxemic respiratory failure.  On exam, the patient was originally very SOB and desaturating to the 60's but was speaking in full sentences.  Originally patient was to be admitted to [facility] SDU but with positive troponins and renal failure decision was made to move him to [facility].  PCCM was called to admit to the ICU but given the report from the EDP in [facility] it was deemed that patient is stable to go to SDU with PCCM consulting for hypoxemic respiratory failure.  [facility] and upon presenting to [facility] he was down to 65% on BiPAP and seemed to be tolerating it well.  On exam, bibasilar crackles noted.  I reviewed CXR myself and pulmonary edema noted with concern for right sided infiltrate.  Pulmonary edema:  - Diureses as renal function allows.  - Strict I/O.  ?CAP  - Rocephin  - Zithromax  - Pan culture  - PCT  Hypoxemic respiratory failure:  - BiPAP (makes patient more comfortable)  - Titrate O2 for sat of 88-92%  ?PE:  - Heparin drip  - Will need CTA if renal function allows  - Lower ext U/S.  The patient is critically ill with multiple organ systems failure and requires high complexity decision making for assessment and support, frequent evaluation and titration of therapies, application of advanced monitoring technologies and extensive interpretation of multiple databases.   Critical Care Time devoted to patient care services described in this  note is  35  Minutes. This time reflects time of care of this signee Dr Koren Bound. This critical care time does not reflect procedure time, or teaching time or supervisory time of PA/NP/Med student/Med Resident etc but could involve care discussion time.  Alyson Reedy, M.D. [facility] Pulmonary/Critical Care Medicine. Pager: [phone]. After hours pager: [phone].

## 2017-03-11 NOTE — Consult Note (Signed)
Requesting provider: Merlene Laughter, DO Consulting cardiologist: Jonelle Sidle, MD  Reason for consultation: Suspected heart failure and abnormal troponin I  Clinical Summary [name] is a 61 y.o.male with past medical history outlined below, presenting to the [facility] emergency department directly from PCP visit today with Dr. Margo Aye. He describes a 2-3 day history of increasing shortness of breath, intermittent coughing, orthopnea, and worsening leg swelling. He has had no chest pain or palpitations during this time, no syncope. He denies any distinct fevers or chills, states that appetite has been stable. On evaluation he was noted to be in severe hypoxic respiratory failure, chest x-ray showing some interstitial edema but also airspace consolidation throughout much of the right mid and lower lung zone. He was placed on BiPAP per ED staff.  Initial laboratory studies notes significant abnormalities including creatinine up to 2.9, BNP 1836, troponin I 5.37, WBC 27, and glucose 259. Blood cultures have been sent, patient noted to be afebrile so far. ECG shows sinus tachycardia with diffuse largely nonspecific ST-T changes, ST depression noted in the lateral leads.  Chart reviewed, patient has a history of acute diastolic heart failure back in December 2017 noted in the setting of pneumonia with hospitalization. Echocardiogram from February 2017 reported LVEF 55-60% with mild mitral and tricuspid regurgitation, PASP 43 mmHg. He does not endorse any history of CAD or previous myocardial infarction, no cardiac arrhythmias. Family history is unknown as he is adopted.  He does report chronic kidney disease, has seen Dr. Kristian Covey previously. Reports that he makes a "normal amount" of urine. Uncertain what his recent baseline renal function has been.   No Known Allergies  Home Medications No current facility-administered medications on file prior to encounter.    Current Outpatient  Prescriptions on File Prior to Encounter  Medication Sig Dispense Refill  . aspirin EC 81 MG tablet Take 81 mg by mouth daily.    . citalopram (CELEXA) 20 MG tablet Take 20 mg by mouth daily.    . Cyanocobalamin (B-12 PO) Take 1 tablet by mouth daily.    . furosemide (LASIX) 40 MG tablet Take 1 tablet (40 mg total) by mouth daily. Start on 2/14 (Patient taking differently: Take 40 mg by mouth daily as needed for fluid. Start on 2/14) 30 tablet 1  . glimepiride (AMARYL) 4 MG tablet Take 4 mg by mouth daily with breakfast.       Past Medical History:  Diagnosis Date  . Anemia   . Anxiety   . CKD (chronic kidney disease) stage 3, GFR 30-59 ml/min   . Diastolic heart failure [facility])    Episode December 2017 in the setting of pneumonia  . Essential hypertension   . History of pneumonia   . Hyperlipemia   . Osteomyelitis (HCC)   . Type 2 diabetes mellitus (HCC)     Past Surgical History:  Procedure Laterality Date  . AMPUTATION Right 12/24/2015   Procedure: PARTIAL THIRD RAY AMPUTATION OF THE RIGHT FOOT (AMPUTATION OF THE THIRD TOE AND A PORTION OF THE THIRD METATARSAL);  Surgeon: Ferman Hamming, DPM;  Location: [facility];  Service: Podiatry;  Laterality: Right;    Family History  Problem Relation Age of Onset  . Adopted: Yes    Social History Mr. Cominsky reports that he has never smoked. He has never used smokeless tobacco. Mr. Alldredge reports that he does not drink alcohol.  Review of Systems Complete review of systems negative except as otherwise outlined in  the clinical summary and also the following. Fairly chronic intermittent leg edema, left worse than right.  Physical Examination Blood pressure 108/81, pulse 100, resp. rate 17, height  (1.778 m), weight 170 lb (77.1 kg), SpO2 93 %.  Telemetry: Sinus rhythm, personally reviewed.  Gen.: Patient wearing BiPAP, denies active chest pain. HEENT: Conjunctiva and lids normal, BiPAP mask in place. Neck: Supple, elevated  JVP present, no obvious carotid bruits, no thyromegaly. Lungs: Some egophony right mid lung zone, scattered rhonchi and crackles, labored breathing at rest. Cardiac: Regular rate and rhythm, probable soft S3, soft apical systolic murmur, no pericardial rub. Abdomen: Soft, nontender, bowel sounds present but diminished, no guarding or rebound. Extremities: 2+ leg edema, distal pulses 1-2+. Skin: Warm and dry. Musculoskeletal: No kyphosis. Neuropsychiatric: Alert and oriented x3, affect grossly appropriate.   Lab Results  Basic Metabolic Panel:  Recent Labs Lab 03/11/17 0920 03/11/17 0922  NA 136 136  K 4.4 4.3  CL 102 105  CO2 17*  --   GLUCOSE 265* 259*  BUN 64* 57*  CREATININE 2.86* 2.90*  CALCIUM 8.7*  --     Liver Function Tests:  Recent Labs Lab 03/11/17 0920  AST 70*  ALT 18  ALKPHOS 50  BILITOT 0.6  PROT 6.7  ALBUMIN 3.1*    CBC:  Recent Labs Lab 03/11/17 0920 03/11/17 0922  WBC 27.3*  --   NEUTROABS 24.9*  --   HGB 12.4* 11.9*  HCT 36.6* 35.0*  MCV 90.4  --   PLT 289  --     Cardiac Enzymes: Troponin I 5.37  BNP: 1836  Imaging  Echocardiogram 12/20/2015: Study Conclusions  - Left ventricle: The cavity size was normal. There was mild focal   basal hypertrophy of the septum. Systolic function was normal.   The estimated ejection fraction was in the range of 55% to 60%.   Wall motion was normal; there were no regional wall motion   abnormalities. The study is not technically sufficient to allow   evaluation of LV diastolic function. - Mitral valve: There was mild regurgitation. - Right ventricle: The cavity size was normal. Wall thickness was   normal. Systolic function was normal. - Tricuspid valve: There was mild regurgitation. - Pulmonic valve: There was trivial regurgitation. - Pulmonary arteries: Systolic pressure was mildly increased. PA   peak pressure: 43 mm Hg (S).  Chest x-ray 03/11/2017: FINDINGS: There is airspace  consolidation throughout much of the right mid and lower lung zone. There is interstitial edema underlying, primarily in the perihilar regions. There is patchy alveolar edema in the left mid lower lung zones. Heart is enlarged with pulmonary venous hypertension. No adenopathy. There is a small right pleural effusion, layering.  IMPRESSION: Evidence a degree of underlying congestive heart failure. Suspect pneumonia throughout the right mid and lower lung zones superimposed. No adenopathy evident.  Impression  1. Presentation with acute hypoxic respiratory failure, chest x-ray concerning for combination of congestive heart failure and also likely pneumonia. Patient has had cough but denies fevers and chills, does have a significant leukocytosis at presentation. He has no prior history of cardiomyopathy, last echocardiogram was in February 2017 as outlined above. Has probable soft S3 on examination and increased JVP. Development of interval or recent systolic dysfunction is of concern.  2. Elevated troponin I consistent with NSTEMI. No active chest pain however and ECG nonspecific overall. He does not report any known history of ischemic heart disease but does have cardiac risk factors including  age and gender, type 2 diabetes mellitus, hyperlipidemia and hypertension.  3. Acute on chronic renal failure, presumably CKD stage 3 at baseline. Creatinine now 2.9. Patient has seen Dr. Kristian Covey previously.  4. Possible community-acquired pneumonia.  5. Type 2 diabetes mellitus, looks to have been on Amaryl and possibly Glucophage as an outpatient.  Recommendations  Overall high-risk patient with multiple comorbidities. Discussed with Dr. Clarene Duke and also Dr. Marland Mcalpine. Would recommend admission at [facility], currently being arranged for stepdown unit on the hospitalist team. Would continue to provide pulmonary support, initiate IV diuresis, empiric antibiotics, and obtain a follow-up  echocardiogram to reassess cardiac structure and function. Would heparinize at least in the short-term until troponin I trend is better characterized. Patient is not a good candidate for cardiac catheterization at this time in light of acute on chronic renal failure however. Would have him on aspirin and statin therapy. If he has new significant LV dysfunction, may need to involve the CHF team as well. Our cardiology service will continue to follow at [facility].  Jonelle Sidle, M.D., F.A.C.C.

## 2017-03-11 NOTE — Progress Notes (Signed)
ANTICOAGULATION CONSULT NOTE - Initial Consult  Pharmacy Consult for heparin Indication: chest pain/ACS  No Known Allergies  Patient Measurements: Height:  (177.8 cm) Weight: 170 lb (77.1 kg) IBW/kg (Calculated) : 73 Heparin Dosing Weight: 77.1 kg  Vital Signs: BP: 107/80 (04/27 0930) Pulse Rate: 109 (04/27 0930)  Labs:  Recent Labs  03/11/17 0920 03/11/17 0922  HGB 12.4* 11.9*  HCT 36.6* 35.0*  PLT 289  --   CREATININE 2.86* 2.90*    Estimated Creatinine Clearance: 28.3 mL/min (A) (by C-G formula based on SCr of 2.9 mg/dL (H)).   Medical History: Past Medical History:  Diagnosis Date  . Anemia   . Anxiety   . CHF (congestive heart failure) (HCC)   . Diabetes mellitus without complication (HCC)   . Hyperlipemia   . Hypertension   . Osteomyelitis (HCC)     Medications:  See medication history.  Was not on anticoagulation PTA  Assessment: 61 yo man to start heparin for CP, r/o ACS.  Troponin is 5.37.  Hg low at 11.9 g/dL Goal of Therapy:  Heparin level 0.3-0.7 units/ml Monitor platelets by anticoagulation protocol: Yes   Plan:  Heparin bolus 4000 units and drip at 1200 units/hr Check heparin level 6 hours after start Daily HL and CBC while on heparin Monitor for bleeding complications  Nupur Hohman Poteet 03/11/2017,10:37 AM

## 2017-03-11 NOTE — ED Notes (Signed)
Lab at bedside to obtain lactic acid.

## 2017-03-11 NOTE — ED Notes (Signed)
Respiratory at bedside to obtain ABG.

## 2017-03-11 NOTE — ED Notes (Signed)
Lab at bedside to collect second culture.

## 2017-03-11 NOTE — ED Notes (Signed)
Pulse ox probe placed on R earlobe reading 87-88% on bipap.

## 2017-03-11 NOTE — ED Notes (Signed)
Wife, Douglas Cannon [phone]

## 2017-03-11 NOTE — Progress Notes (Signed)
CRITICAL VALUE ALERT  Critical value received:  Troponin 16.91  Date of notification:  03/11/2017   Time of notification:  5:04 PM   Critical value read back: yes  Nurse who received alert:  Minna Antis  MD notified (1st page): Dr. Melynda Ripple  Time of first page:  5:05 PM   MD notified (2nd page): Jeraldine Loots NP (cardiology)  Time of second page: 0529  Responding MD:  Dr. Melynda Ripple by phone; Dr. Antoine Poche w/ cardiology to bedside  Time MD responded:

## 2017-03-11 NOTE — ED Triage Notes (Signed)
PT c/o SOB that started two days ago with swelling to bilateral lower extremities, dusky skin color and new cough. PT went to his PCP office this morning and oxygen level for low and the office manager drove him to the ED.

## 2017-03-11 NOTE — Progress Notes (Signed)
ANTICOAGULATION CONSULT NOTE -Follow up   Pharmacy Consult for heparin Indication: chest pain/ACS  No Known Allergies  Patient Measurements: Height:  (177.8 cm) Weight: 170 lb (77.1 kg) IBW/kg (Calculated) : 73 Heparin Dosing Weight: 77.1 kg  Vital Signs: Temp: 97.7 F (36.5 C) (04/27 1525) Temp Source: Axillary (04/27 1157) BP: 118/87 (04/27 1810) Pulse Rate: 91 (04/27 1819)  Labs:  Recent Labs  03/11/17 0920 03/11/17 0922 03/11/17 1543 03/11/17 1753  HGB 12.4* 11.9*  --   --   HCT 36.6* 35.0*  --   --   PLT 289  --   --   --   HEPARINUNFRC  --   --   --  0.34  CREATININE 2.86* 2.90*  --   --   TROPONINI  --   --  16.91*  --     Estimated Creatinine Clearance: 28.3 mL/min (A) (by C-G formula based on SCr of 2.9 mg/dL (H)).   Medical History: Past Medical History:  Diagnosis Date  . Anemia   . Anxiety   . CKD (chronic kidney disease) stage 3, GFR 30-59 ml/min   . Diastolic heart failure [facility])    Episode December 2017 in the setting of pneumonia  . Essential hypertension   . History of pneumonia   . Hyperlipemia   . Osteomyelitis (HCC)   . Type 2 diabetes mellitus (HCC)     Medications:  See medication history.  Was not on anticoagulation PTA  Assessment: 61 yo man to started on IV heparin for CP, r/o ACS.  Troponin 5.37>16.91.  Hg on admit was low at 11.9 g/dL Initial 7 hour heparin level = 0.34, therapeutic on heparin 1200 units/hr and after 4000 units bolus.  No bleeding noted.  Heparin level is at the low end of therapeutic range, may still reflect some of bolus. I will increase heparin drip to keep HL within goal range 0.3-0.7  No issues/problems with IV heparin infusion and no bleeding noted per RN's report.   Goal of Therapy:  Heparin level 0.3-0.7 units/ml Monitor platelets by anticoagulation protocol: Yes   Plan:  Increase heparin drip rate to 1300 units/hr Check heparin level 6 hours after rate increased. Daily HL and CBC while on  heparin Monitor for bleeding complications   Thank you for allowing pharmacy to be part of this patients care team. Noah Delaine, RPh Clinical Pharmacist Pager: [phone] 4p-1030p: # [phone] Main Rx: [id number] 03/11/2017,6:42 PM

## 2017-03-11 NOTE — Progress Notes (Signed)
Called to the bedside to evaluate patient who was transferred from a another facility. Patient awake and alert and oriented 3 at bedside. Increased work of breathing on BiPAP with settings 16/8. Respiratory came and evaluated patient set FiO2 to 60%. Patient was good SPO2 with waveform on monitor. Requested scheduled DuoNeb's, order in. Critical care team to bedside. They have order continue diuresis. Reviewed current orders. Patient per nursing did not put much urine out after Lasix so due to history of BPH will bladder scan patient. OrderED urine labs for calculation of fractional excretion of urea due to acute on chronic renal failure. Suspect renal injury is likely due to hypoxia, HTN. RT consult ordered.  Haydee Salter MD

## 2017-03-12 ENCOUNTER — Inpatient Hospital Stay (HOSPITAL_COMMUNITY): Payer: BLUE CROSS/BLUE SHIELD

## 2017-03-12 DIAGNOSIS — I213 ST elevation (STEMI) myocardial infarction of unspecified site: Secondary | ICD-10-CM

## 2017-03-12 DIAGNOSIS — J189 Pneumonia, unspecified organism: Secondary | ICD-10-CM

## 2017-03-12 DIAGNOSIS — J9601 Acute respiratory failure with hypoxia: Secondary | ICD-10-CM

## 2017-03-12 LAB — GLUCOSE, CAPILLARY
Glucose-Capillary: 169 mg/dL — ABNORMAL HIGH (ref 65–99)
Glucose-Capillary: 183 mg/dL — ABNORMAL HIGH (ref 65–99)
Glucose-Capillary: 127 mg/dL — ABNORMAL HIGH (ref 65–99)
Glucose-Capillary: 121 mg/dL — ABNORMAL HIGH (ref 65–99)

## 2017-03-12 LAB — CBC WITH DIFFERENTIAL/PLATELET
Basophils Absolute: 0 10*3/uL (ref 0.0–0.1)
Basophils Relative: 0 %
Eosinophils Absolute: 0 10*3/uL (ref 0.0–0.7)
Eosinophils Relative: 0 %
HCT: 29.4 % — ABNORMAL LOW (ref 39.0–52.0)
Hemoglobin: 9.7 g/dL — ABNORMAL LOW (ref 13.0–17.0)
Lymphocytes Relative: 10 %
Lymphs Abs: 1.6 10*3/uL (ref 0.7–4.0)
MCH: 29.4 pg (ref 26.0–34.0)
MCHC: 33 g/dL (ref 30.0–36.0)
MCV: 89.1 fL (ref 78.0–100.0)
Monocytes Absolute: 0.8 10*3/uL (ref 0.1–1.0)
Monocytes Relative: 5 %
Neutro Abs: 13.3 10*3/uL — ABNORMAL HIGH (ref 1.7–7.7)
Neutrophils Relative %: 85 %
Platelets: 212 10*3/uL (ref 150–400)
RBC: 3.3 MIL/uL — ABNORMAL LOW (ref 4.22–5.81)
RDW: 14.1 % (ref 11.5–15.5)
WBC: 15.8 10*3/uL — ABNORMAL HIGH (ref 4.0–10.5)

## 2017-03-12 LAB — LIPID PANEL
Cholesterol: 103 mg/dL (ref 0–200)
HDL: 45 mg/dL (ref >40)
LDL Cholesterol: 35 mg/dL (ref 0–99)
Total CHOL/HDL Ratio: 2.3 RATIO
Triglycerides: 114 mg/dL (ref <150)
VLDL: 23 mg/dL (ref 0–40)

## 2017-03-12 LAB — URINALYSIS, ROUTINE W REFLEX MICROSCOPIC
Bacteria, UA: NONE SEEN
Bilirubin Urine: NEGATIVE
Glucose, UA: 50 mg/dL — AB
Ketones, ur: NEGATIVE mg/dL
Leukocytes, UA: NEGATIVE
Nitrite: NEGATIVE
Protein, ur: 100 mg/dL — AB
Specific Gravity, Urine: 1.009 (ref 1.005–1.030)
Squamous Epithelial / LPF: NONE SEEN
pH: 5 (ref 5.0–8.0)

## 2017-03-12 LAB — COMPREHENSIVE METABOLIC PANEL
ALT: 16 U/L — ABNORMAL LOW (ref 17–63)
AST: 78 U/L — ABNORMAL HIGH (ref 15–41)
Albumin: 2.4 g/dL — ABNORMAL LOW (ref 3.5–5.0)
Alkaline Phosphatase: 37 U/L — ABNORMAL LOW (ref 38–126)
Anion gap: 10 (ref 5–15)
BUN: 70 mg/dL — ABNORMAL HIGH (ref 6–20)
CO2: 22 mmol/L (ref 22–32)
Calcium: 8.2 mg/dL — ABNORMAL LOW (ref 8.9–10.3)
Chloride: 106 mmol/L (ref 101–111)
Creatinine, Ser: 2.82 mg/dL — ABNORMAL HIGH (ref 0.61–1.24)
GFR calc Af Amer: 27 mL/min — ABNORMAL LOW (ref >60)
GFR calc non Af Amer: 23 mL/min — ABNORMAL LOW (ref >60)
Glucose, Bld: 177 mg/dL — ABNORMAL HIGH (ref 65–99)
Potassium: 3.8 mmol/L (ref 3.5–5.1)
Sodium: 138 mmol/L (ref 135–145)
Total Bilirubin: 0.2 mg/dL — ABNORMAL LOW (ref 0.3–1.2)
Total Protein: 5.4 g/dL — ABNORMAL LOW (ref 6.5–8.1)

## 2017-03-12 LAB — HEPARIN LEVEL (UNFRACTIONATED)
Heparin Unfractionated: 0.32 IU/mL (ref 0.30–0.70)
Heparin Unfractionated: 0.34 IU/mL (ref 0.30–0.70)

## 2017-03-12 LAB — RESPIRATORY PANEL BY PCR

## 2017-03-12 LAB — ECHOCARDIOGRAM COMPLETE
Height: 70 in
Weight: 3025.6 oz

## 2017-03-12 LAB — T4, FREE: Free T4: 0.92 ng/dL (ref 0.61–1.12)

## 2017-03-12 LAB — HEMOGLOBIN A1C
Hgb A1c MFr Bld: 6.3 % — ABNORMAL HIGH (ref 4.8–5.6)
Mean Plasma Glucose: 134 mg/dL

## 2017-03-12 LAB — TSH: TSH: 2.6 u[IU]/mL (ref 0.350–4.500)

## 2017-03-12 LAB — CREATININE, URINE, RANDOM: Creatinine, Urine: 53.66 mg/dL

## 2017-03-12 LAB — TROPONIN I: Troponin I: 34.91 ng/mL (ref <0.03)

## 2017-03-12 LAB — HIV ANTIBODY (ROUTINE TESTING W REFLEX): HIV Screen 4th Generation wRfx: NONREACTIVE

## 2017-03-12 LAB — STREP PNEUMONIAE URINARY ANTIGEN: Strep Pneumo Urinary Antigen: NEGATIVE

## 2017-03-12 LAB — MAGNESIUM: Magnesium: 2 mg/dL (ref 1.7–2.4)

## 2017-03-12 LAB — PHOSPHORUS: Phosphorus: 4.3 mg/dL (ref 2.5–4.6)

## 2017-03-12 MED ORDER — ORAL CARE MOUTH RINSE
15.0000 mL | Freq: Two times a day (BID) | OROMUCOSAL | Status: DC
Start: 2017-03-12 — End: 2017-03-22
  Administered 2017-03-12 – 2017-03-21 (×10): 15 mL via OROMUCOSAL

## 2017-03-12 MED ORDER — CHLORHEXIDINE GLUCONATE 0.12 % MT SOLN
15.0000 mL | Freq: Two times a day (BID) | OROMUCOSAL | Status: DC
Start: 2017-03-12 — End: 2017-03-22
  Administered 2017-03-12 – 2017-03-21 (×19): 15 mL via OROMUCOSAL
  Filled 2017-03-12 (×18): qty 15

## 2017-03-12 MED ORDER — HEPARIN (PORCINE) IN NACL 100-0.45 UNIT/ML-% IJ SOLN
1350.0000 [IU]/h | INTRAMUSCULAR | Status: DC
  Administered 2017-03-12: 1350 [IU]/h via INTRAVENOUS
  Filled 2017-03-12: qty 250

## 2017-03-12 NOTE — Progress Notes (Signed)
ANTICOAGULATION CONSULT NOTE - Follow Up Consult  Pharmacy Consult for Heparin  Indication: chest pain/ACS  No Known Allergies  Patient Measurements: Height:  (177.8 cm) Weight: 170 lb (77.1 kg) IBW/kg (Calculated) : 73 Vital Signs: Temp: 98.1 F (36.7 C) (04/27 1900) Temp Source: Axillary (04/27 1900) BP: 108/84 (04/28 0025) Pulse Rate: 88 (04/28 0025)  Labs:  Recent Labs  03/11/17 0920 03/11/17 0922 03/11/17 1543 03/11/17 1753 03/11/17 2050 03/11/17 2352  HGB 12.4* 11.9*  --   --   --   --   HCT 36.6* 35.0*  --   --   --   --   PLT 289  --   --   --   --   --   HEPARINUNFRC  --   --   --  0.34  --  0.34  CREATININE 2.86* 2.90*  --   --   --   --   TROPONINI  --   --  16.91*  --  29.89*  --     Estimated Creatinine Clearance: 28.3 mL/min (A) (by C-G formula based on SCr of 2.9 mg/dL (H)).   Assessment: Heparin for CP, medical management for now with renal function, heparin level therapeutic x 2  Goal of Therapy:  Heparin level 0.3-0.7 units/ml Monitor platelets by anticoagulation protocol: Yes   Plan:  -Cont heparin 1300 units/hr -Daily CBC/HL  Abran Duke 03/12/2017,12:50 AM

## 2017-03-12 NOTE — Plan of Care (Signed)
Problem: Pain Managment: Goal: General experience of comfort will improve Outcome: Progressing No complaints of pain.   Problem: Tissue Perfusion: Goal: Risk factors for ineffective tissue perfusion will decrease Outcome: Progressing Heparin and Nitro drips maintained.   Problem: Activity: Goal: Risk for activity intolerance will decrease Outcome: Not Progressing Limited movement/bed rest due to respiratory status.   Problem: Fluid Volume: Goal: Ability to maintain a balanced intake and output will improve Outcome: Progressing Patient had 400 cc urine output, with estimated 200cc output unable to be recorded at beginning of shift.

## 2017-03-12 NOTE — Progress Notes (Signed)
[facility] Attending Note  Presenting HPI:  61 y.o. male transferred to our facility on 4/27 with shortness of breath. Dyspnea started on 4/25 with progressive worsening. Occurred with traveling for work approximately 4 hours for the past several days. Patient was noted to be hypoxic into the 50s on room air at his primary care physician's office. He was subsequently brought to the emergency department for further evaluation. Imaging findings concerning for volume overload with possible underlying pneumonia. Started initially on noninvasive positive pressure ventilation as well as heparin and nitroglycerin infusions. Patient was transferred from outside hospital to [facility] for further evaluation and management.  Subjective:  Reports dyspnea is improving. He denies any significant cough. Patient transiently liberated from BiPAP this morning but had desaturation with nasal cannula. Reportedly was not tried on Venturi mask.  Review of Systems:  Denies any subjective fever or chills. No abdominal pain or nausea. No myalgias. No diarrhea. No headache or vision changes.  Temp:  [97.4 F (36.3 C)-98.2 F (36.8 C)] 97.4 F (36.3 C) (04/28 0536) Pulse Rate:  [85-107] 88 (04/28 0832) Resp:  [8-29] 14 (04/28 0832) BP: (107-127)/(81-95) 108/84 (04/28 0025) SpO2:  [80 %-99 %] 94 % (04/28 0832) FiO2 (%):  [55 %-100 %] 70 % (04/28 0753) Weight:  [189 lb 1.6 oz (85.8 kg)] 189 lb 1.6 oz (85.8 kg) (04/28 0536)   General:  Awake. No distress. Alert. Wife at bedside. Integument:  Warm & dry. No rash or bruising on exposed skin. HEENT:  No scleral icterus or injection. Pupils symmetric.  Pulmonary:  Mild bilateral basilar crackles. Normal work of breathing on BiPAP.  Cardiovascular:  Regular rate . No JVD apprecaited.  Abdomen:  Soft. Nontender. Normal bowel sounds. Neurological:  Cranial nerves grossly in tact. No meningismus. Moving all 4 extremities equally. Oriented x4.   CBC  Latest Ref Rng & Units 03/12/2017 03/11/2017 03/11/2017  WBC 4.0 - 10.5 K/uL 15.8(H) - 27.3(H)  Hemoglobin 13.0 - 17.0 g/dL 1.6(X) 11.9(L) 12.4(L)  Hematocrit 39.0 - 52.0 % 29.4(L) 35.0(L) 36.6(L)  Platelets 150 - 400 K/uL 212 - 289   BMP Latest Ref Rng & Units 03/12/2017 03/11/2017 03/11/2017  Glucose 65 - 99 mg/dL 096(E) 454(U) 981(X)  BUN 6 - 20 mg/dL 91(Y) 78(G) 95(A)  Creatinine 0.61 - 1.24 mg/dL 2.13(Y) 8.65(H) 8.46(N)  Sodium 135 - 145 mmol/L 138 136 136  Potassium 3.5 - 5.1 mmol/L 3.8 4.3 4.4  Chloride 101 - 111 mmol/L 106 105 102  CO2 22 - 32 mmol/L 22 - 17(L)  Calcium 8.9 - 10.3 mg/dL 8.2(L) - 8.7(L)    IMAGING/STUDIES: PORT CXR 4/28:  Personally reviewed by me. Patchy bilateral alveolar opacities worse on the right compared with the left with significant improvement in confluence compared with x-ray imaging from yesterday. No obvious pleural effusion but there is a small amount of fluid within the right fissure. No obvious consolidation. TTE 4/28 >>>  MICROBIOLOGY: MRSA PCR 4/27:  Negative Blood Cultures x2 4/27 >>> HIV 4/27:  Nonreactive  Respiratory Viral Panel PCR 4/27 >>> Urine Streptococcal Antigen 4/28:  Negative  Urine Legionella Antigen 4/28 >>>  ANTIBIOTICS: Azithromycin 4/27 >>> Rocephin 4/27 >>>  ASSESSMENT/PLAN:  61 y.o. male presenting with acute hypoxic respiratory failure. Certainly concerning for possible pulmonary embolism given his recent travels. Alternatively, this could also represent pulmonary edema from congestive heart failure. The patient's Procalcitonin is elevated raising the additional possibility of an infectious process. Patient does have recent mold exposure.  1.  Acute hypoxic respiratory failure: Continuing noninvasive positive pressure ventilation as needed. Recommend continuing diuresis as renal function and blood pressure allow. Unable to perform bronchoscopy given oxygen requirement at this time. 2. Possible pulmonary embolism:  Echocardiogram and venous duplex pending. Recommend continuing empiric systemic anticoagulation with heparin drip. 3. Possible severe community acquired pneumonia: Infectious workup pending with respiratory viral panel, cultures, and urine Legionella antigen. Continuing to trend Procalcitonin per algorithm. Recommend continuing broad-spectrum antibiotic therapy with Rocephin and azithromycin day #2. 4. Possible decompensated congestive heart failure: Management per primary service and cardiology. Echocardiogram pending.  I have spent a total of 36 minutes of time today caring for the patient, reviewing the patient's electronic medical record, and with more than 50% of that time spent coordinating care with the patient as well as reviewing the continuing plan of care with the patient at bedside.  Remainder of care as per primary service and other consultants. We will reassess the patient on 4/30. Please contact the rounding physician if there are any further questions or concerns before then.  Donna Christen Jamison Neighbor, M.D. [facility] Pager:  [phone] After 3pm or if no response, call [phone] 9:55 AM 03/12/17

## 2017-03-12 NOTE — Progress Notes (Signed)
Subjective:  Denies SSCP, palpitations or Dyspnea On CPAP   Objective:  Vitals:   03/12/17 0315 03/12/17 0536 03/12/17 0753 03/12/17 0832  BP:      Pulse: 88   88  Resp: 20   14  Temp:  97.4 F (36.3 C)    TempSrc:  Axillary    SpO2: 97%  97% 94%  Weight:  189 lb 1.6 oz (85.8 kg)    Height:        Intake/Output from previous day:  Intake/Output Summary (Last 24 hours) at 03/12/17 0835 Last data filed at 03/12/17 9604  Gross per 24 hour  Intake           449.44 ml  Output              890 ml  Net          -440.56 ml    Physical Exam: Affect appropriate Chronically ill white male  HEENT: normal Neck supple with no adenopathy JVP normal no bruits no thyromegaly Lungs clear with no wheezing and good diaphragmatic motion Heart:  S1/S2 no murmur, no rub, gallop or click PMI normal Abdomen: benighn, BS positve, no tenderness, no AAA no bruit.  No HSM or HJR Distal pulses intact with no bruits No edema Neuro non-focal Skin warm and dry No muscular weakness   Lab Results: Basic Metabolic Panel:  Recent Labs  54/09/81 0920 03/11/17 0922 03/12/17 0343  NA 136 136 138  K 4.4 4.3 3.8  CL 102 105 106  CO2 17*  --  22  GLUCOSE 265* 259* 177*  BUN 64* 57* 70*  CREATININE 2.86* 2.90* 2.82*  CALCIUM 8.7*  --  8.2*  MG  --   --  2.0  PHOS  --   --  4.3   Liver Function Tests:  Recent Labs  03/11/17 0920 03/12/17 0343  AST 70* 78*  ALT 18 16*  ALKPHOS 50 37*  BILITOT 0.6 0.2*  PROT 6.7 5.4*  ALBUMIN 3.1* 2.4*   No results for input(s): LIPASE, AMYLASE in the last 72 hours. CBC:  Recent Labs  03/11/17 0920 03/11/17 0922 03/12/17 0343  WBC 27.3*  --  15.8*  NEUTROABS 24.9*  --  13.3*  HGB 12.4* 11.9* 9.7*  HCT 36.6* 35.0* 29.4*  MCV 90.4  --  89.1  PLT 289  --  212   Cardiac Enzymes:  Recent Labs  03/11/17 1543 03/11/17 2050 03/12/17 0343  TROPONINI 16.91* 29.89* 34.91*   BNP: Invalid input(s): POCBNP D-Dimer:  Recent Labs  03/11/17 1753  DDIMER 0.48   Hemoglobin A1C:  Recent Labs  03/11/17 1543  HGBA1C 6.3*   Fasting Lipid Panel:  Recent Labs  03/12/17 0343  CHOL 103  HDL 45  LDLCALC 35  TRIG 114  CHOLHDL 2.3   Thyroid Function Tests:  Recent Labs  03/12/17 0343  TSH 2.600    Imaging: Dg Chest Portable 1 View  Result Date: 03/11/2017 CLINICAL DATA:  Shortness of breath and cough EXAM: PORTABLE CHEST 1 VIEW COMPARISON:  December 26, 2015 FINDINGS: There is airspace consolidation throughout much of the right mid and lower lung zone. There is interstitial edema underlying, primarily in the perihilar regions. There is patchy alveolar edema in the left mid lower lung zones. Heart is enlarged with pulmonary venous hypertension. No adenopathy. There is a small right pleural effusion, layering. IMPRESSION: Evidence a degree of underlying congestive heart failure. Suspect pneumonia throughout the right mid and lower lung  zones superimposed. No adenopathy evident. Electronically Signed   By: Bretta Bang III M.D.   On: 03/11/2017 09:43    Cardiac Studies:  ECG: NSR no acute ST/T Wave changes    Telemetry:  NSR no VT 03/12/2017 personally reivewed   Echo: pending normal EF in February   Medications:   . aspirin EC  81 mg Oral Daily  . chlorhexidine  15 mL Mouth Rinse BID  . furosemide  40 mg Intravenous Q12H  . insulin aspart  0-9 Units Subcutaneous TID WC  . ipratropium-albuterol  3 mL Nebulization Q8H  . mouth rinse  15 mL Mouth Rinse q12n4p  . rosuvastatin  5 mg Oral QHS  . sodium chloride flush  3 mL Intravenous Q12H     . sodium chloride    . azithromycin    . cefTRIAXone (ROCEPHIN)  IV 1 g (03/12/17 0824)  . heparin 1,300 Units/hr (03/12/17 0416)  . nitroGLYCERIN 5 mcg/min (03/11/17 2129)    Assessment/Plan:  SEMI:  No chest pain ECG no acute changes but troponin now in the 34.9 range would suggest circumlfex coronary artery occlusion. Echo pending See notes Dr Diona Browner and  Hochrein no cath Do to lack of acute ECG changes and risk of renal failure continue ASA statin and heparin with low Dose iv nitro.    Chol:  On statin  CRF:  Making urine should get renal US while in hospital   Pneumonia: with respiratory failure on rocephin and azithromycin   Charlton Haws 03/12/2017, 8:35 AM

## 2017-03-12 NOTE — Progress Notes (Signed)
ANTICOAGULATION CONSULT NOTE -Follow up   Pharmacy Consult for heparin Indication: chest pain/ACS  No Known Allergies  Patient Measurements: Height:  (177.8 cm) Weight: 189 lb 1.6 oz (85.8 kg) IBW/kg (Calculated) : 73 Heparin Dosing Weight: 77.1 kg  Vital Signs: Temp: 97.4 F (36.3 C) (04/28 0536) Temp Source: Axillary (04/28 0536) BP: 108/84 (04/28 0025) Pulse Rate: 88 (04/28 0832)  Labs:  Recent Labs  03/11/17 0920 03/11/17 6045 03/11/17 1543 03/11/17 1753 03/11/17 2050 03/11/17 2352 03/12/17 0343  HGB 12.4* 11.9*  --   --   --   --  9.7*  HCT 36.6* 35.0*  --   --   --   --  29.4*  PLT 289  --   --   --   --   --  212  HEPARINUNFRC  --   --   --  0.34  --  0.34 0.32  CREATININE 2.86* 2.90*  --   --   --   --  2.82*  TROPONINI  --   --  16.91*  --  29.89*  --  34.91*    Estimated Creatinine Clearance: 29.1 mL/min (A) (by C-G formula based on SCr of 2.82 mg/dL (H)).   Medical History: Past Medical History:  Diagnosis Date  . Anemia   . Anxiety   . CKD (chronic kidney disease) stage 3, GFR 30-59 ml/min   . Diastolic heart failure [facility])    Episode December 2017 in the setting of pneumonia  . Essential hypertension   . History of pneumonia   . Hyperlipemia   . Osteomyelitis (HCC)   . Type 2 diabetes mellitus (HCC)     Medications:  See medication history.  Was not on anticoagulation PTA  Assessment: 61 yo M admitted on 03/11/2017 with NSTEMI. For now, medical management including heparin. Troponin continues to trend up to 34.  HL remains therapeutic at 0.32. Hgb has trended down from 11.9>9.7, plt wnl and stable. Per RN, no noted s/s bleeding.   Goal of Therapy:  Heparin level 0.3-0.7 units/ml Monitor platelets by anticoagulation protocol: Yes   Plan:  Increase heparin gtt slightly to 1350 units/hr to maintain in goal range since on lower end of goal  Daily HL and CBC while on heparin F/u LOT  Monitor for bleeding complications   Zasha Belleau K.  Bonnye Fava, PharmD, BCPS, CPP Clinical Pharmacist Pager: [phone] Phone: [phone] 03/12/2017 9:50 AM

## 2017-03-12 NOTE — Progress Notes (Signed)
Patient's respiratory panel only positive for rhinovirus/enterovirus. Spoke with the on call infection prevention specialist who stated that patient's droplet precautions can be d/c.

## 2017-03-12 NOTE — Progress Notes (Signed)
RN spoke with covering cardiac covering MD. Troponin value trending up. Noted that cardiac cath not planned due high risk of renal failure. Heparin and nitro infusions continued. Patient has no complaints of chest pain. Comfortably resting on Bipap. Will continue to monitor, no orders received.

## 2017-03-12 NOTE — Progress Notes (Signed)
PROGRESS NOTE    Douglas Cannon  ZOX:[id number] DOB: Jan 01, 1956 DOA: 03/11/2017 PCP: Dwana Melena, MD   Brief Narrative: 61 y.o. male with medical history significant of HTN, HLD, DM Type 2, BPH, CKD Stage 3, Anemia, Anxiety, and ?Diastolic HF with other comorbids who presented to the [facility] ED with worsening shortness of breath and leg swelling for about 2 days. On admission patient was found to have elevated troponin, BNP concerning for CHF and possible pneumonia. Patient was started on heparin drip, IV antibiotics, nitro drip and transferred to [facility] for further evaluation. Already evaluated by cardiologist and pulmonary critical care team.  Assessment & Plan:  #  NSTEMI: Troponin level 34.9. Patient with shortness of breath and leg swelling. He does not have chest pain. Already evaluated by cardiologist. Currently on IV heparin and IV nitroglycerin. No cardiac cath because of renal failure. Continue to monitor. Follow-up cardiologist recommended. Continue aspirin, statin.  #Acute congestive heart failure, type unknown: Follow up echocardiogram. Patient with elevated BNP and lower extremity swelling. Currently on IV Lasix twice a day. Continue to monitor BMP, weight and electrolytes.  #Acute hypoxic respiratory failure due to possible pneumonia, PE: Already evaluated by pulmonologist. Currently on heparin IV. Continue noninvasive positive pressure mechanical ventilation. Follow-up venous duplex lower extremity. Continue to treat for pneumonia. Try to wean to nasal cannula. Also refer to pulmonary team.  #Community acquired pneumonia: Continue ceftriaxone and azithromycin. Follow up respiratory viral studies, culture results.  #Possible pulmonary embolism: Exactly unknown. Unable to obtain CT angiogram because of renal failure. For now continue IV heparin. Pending venous duplex. PCCM and following.  #Acute on chronic kidney disease stage III: Patient has baseline serum creatinine  level around 1.5-2. I will check urinalysis, renal ultrasound. Currently on IV Lasix. Monitor BMP. Avoid nephrotoxins.  #Hyperlipidemia: Continue statin  #Type 2 diabetes: Continue sliding scale. Monitor blood sugar level. Patient's oral intake is not great because of being on BiPAP.  DVT prophylaxis: IV heparin Code Status: Full code Family Communication: Discussed with the patient's wife at bedside Disposition Plan: Likely discharge home in 2-3 days depending on clinical improvement    Consultants:   Cardiologist  Pulmonary critical care  Procedures: Noninvasive ventilation Antimicrobials: Azithromycin and ceftriaxone since 4/27  Subjective: Patient was seen and examined at bedside. Reported that shortness of breath is better today. Still on BiPAP. Denied chest pain. Denied headache, dizziness, nausea or vomiting. Wife at bedside.  Objective: Vitals:   03/12/17 0315 03/12/17 0536 03/12/17 0753 03/12/17 0832  BP:      Pulse: 88   88  Resp: 20   14  Temp:  97.4 F (36.3 C)    TempSrc:  Axillary    SpO2: 97%  97% 94%  Weight:  85.8 kg (189 lb 1.6 oz)    Height:        Intake/Output Summary (Last 24 hours) at 03/12/17 1036 Last data filed at 03/12/17 1000  Gross per 24 hour  Intake           937.91 ml  Output             1490 ml  Net          -552.09 ml   Filed Weights   03/11/17 0915 03/12/17 0536  Weight: 77.1 kg (170 lb) 85.8 kg (189 lb 1.6 oz)    Examination:  General exam: Lying in bed with noninvasive ventilation Respiratory system: Coarse breath sounds bilateral, respiratory effort normal.  Cardiovascular system:  S1 & S2 heard, RRR.  Trace pedal edema  Gastrointestinal system: Abdomen is nondistended, soft and nontender. Normal bowel sounds heard. Central nervous system: Alert awake and following commands Extremities: Symmetric 5 x 5 power. Skin: No rashes, lesions or ulcers Psychiatry: Judgement and insight appear normal. Mood & affect appropriate.      Data Reviewed: I have personally reviewed following labs and imaging studies  CBC:  Recent Labs Lab 03/11/17 0920 03/11/17 0922 03/12/17 0343  WBC 27.3*  --  15.8*  NEUTROABS 24.9*  --  13.3*  HGB 12.4* 11.9* 9.7*  HCT 36.6* 35.0* 29.4*  MCV 90.4  --  89.1  PLT 289  --  212   Basic Metabolic Panel:  Recent Labs Lab 03/11/17 0920 03/11/17 0922 03/12/17 0343  NA 136 136 138  K 4.4 4.3 3.8  CL 102 105 106  CO2 17*  --  22  GLUCOSE 265* 259* 177*  BUN 64* 57* 70*  CREATININE 2.86* 2.90* 2.82*  CALCIUM 8.7*  --  8.2*  MG  --   --  2.0  PHOS  --   --  4.3   GFR: Estimated Creatinine Clearance: 29.1 mL/min (A) (by C-G formula based on SCr of 2.82 mg/dL (H)). Liver Function Tests:  Recent Labs Lab 03/11/17 0920 03/12/17 0343  AST 70* 78*  ALT 18 16*  ALKPHOS 50 37*  BILITOT 0.6 0.2*  PROT 6.7 5.4*  ALBUMIN 3.1* 2.4*   No results for input(s): LIPASE, AMYLASE in the last 168 hours. No results for input(s): AMMONIA in the last 168 hours. Coagulation Profile: No results for input(s): INR, PROTIME in the last 168 hours. Cardiac Enzymes:  Recent Labs Lab 03/11/17 1543 03/11/17 2050 03/12/17 0343  TROPONINI 16.91* 29.89* 34.91*   BNP (last 3 results) No results for input(s): PROBNP in the last 8760 hours. HbA1C:  Recent Labs  03/11/17 1543  HGBA1C 6.3*   CBG:  Recent Labs Lab 03/11/17 1558 03/11/17 2118 03/12/17 0749  GLUCAP 248* 177* 169*   Lipid Profile:  Recent Labs  03/12/17 0343  CHOL 103  HDL 45  LDLCALC 35  TRIG 114  CHOLHDL 2.3   Thyroid Function Tests:  Recent Labs  03/12/17 0343  TSH 2.600  FREET4 0.92   Anemia Panel: No results for input(s): VITAMINB12, FOLATE, FERRITIN, TIBC, IRON, RETICCTPCT in the last 72 hours. Sepsis Labs:  Recent Labs Lab 03/11/17 0920 03/11/17 1223 03/11/17 1753  PROCALCITON  --   --  2.27  LATICACIDVEN 4.0* 2.0* 1.9    Recent Results (from the past 240 hour(s))  Culture,  blood (routine x 2)     Status: None (Preliminary result)   Collection Time: 03/11/17  9:20 AM  Result Value Ref Range Status   Specimen Description BLOOD LEFT ARM  Final   Special Requests   Final    BOTTLES DRAWN AEROBIC AND ANAEROBIC Blood Culture results may not be optimal due to an inadequate volume of blood received in culture bottles   Culture NO GROWTH < 24 HOURS  Final   Report Status PENDING  Incomplete  Culture, blood (routine x 2)     Status: None (Preliminary result)   Collection Time: 03/11/17 10:22 AM  Result Value Ref Range Status   Specimen Description BLOOD LEFT HAND  Final   Special Requests   Final    BOTTLES DRAWN AEROBIC AND ANAEROBIC Blood Culture results may not be optimal due to an inadequate volume of blood received in culture bottles  Culture NO GROWTH < 24 HOURS  Final   Report Status PENDING  Incomplete  MRSA PCR Screening     Status: None   Collection Time: 03/11/17  9:31 PM  Result Value Ref Range Status   MRSA by PCR NEGATIVE NEGATIVE Final    Comment:        The GeneXpert MRSA Assay (FDA approved for NASAL specimens only), is one component of a comprehensive MRSA colonization surveillance program. It is not intended to diagnose MRSA infection nor to guide or monitor treatment for MRSA infections.          Radiology Studies: Dg Chest Portable 1 View  Result Date: 03/11/2017 CLINICAL DATA:  Shortness of breath and cough EXAM: PORTABLE CHEST 1 VIEW COMPARISON:  December 26, 2015 FINDINGS: There is airspace consolidation throughout much of the right mid and lower lung zone. There is interstitial edema underlying, primarily in the perihilar regions. There is patchy alveolar edema in the left mid lower lung zones. Heart is enlarged with pulmonary venous hypertension. No adenopathy. There is a small right pleural effusion, layering. IMPRESSION: Evidence a degree of underlying congestive heart failure. Suspect pneumonia throughout the right mid and  lower lung zones superimposed. No adenopathy evident. Electronically Signed   By: Bretta Bang III M.D.   On: 03/11/2017 09:43        Scheduled Meds: . aspirin EC  81 mg Oral Daily  . chlorhexidine  15 mL Mouth Rinse BID  . furosemide  40 mg Intravenous Q12H  . insulin aspart  0-9 Units Subcutaneous TID WC  . ipratropium-albuterol  3 mL Nebulization Q8H  . mouth rinse  15 mL Mouth Rinse q12n4p  . rosuvastatin  5 mg Oral QHS  . sodium chloride flush  3 mL Intravenous Q12H   Continuous Infusions: . sodium chloride    . azithromycin 500 mg (03/12/17 0931)  . cefTRIAXone (ROCEPHIN)  IV Stopped (03/12/17 0854)  . heparin 1,350 Units/hr (03/12/17 1000)  . nitroGLYCERIN 5 mcg/min (03/11/17 2129)     LOS: 1 day    Markis Langland Jaynie Collins, MD [facility] Pager [phone]  If 7PM-7AM, please contact night-coverage [email or web address] Password [facility] 03/12/2017, 10:36 AM

## 2017-03-12 NOTE — Progress Notes (Signed)
Echocardiogram 2D Echocardiogram has been performed.  Douglas Cannon 03/12/2017, 10:42 AM

## 2017-03-13 ENCOUNTER — Inpatient Hospital Stay (HOSPITAL_COMMUNITY): Payer: BLUE CROSS/BLUE SHIELD

## 2017-03-13 DIAGNOSIS — R0602 Shortness of breath: Secondary | ICD-10-CM

## 2017-03-13 LAB — CBC
HCT: 28.4 % — ABNORMAL LOW (ref 39.0–52.0)
Hemoglobin: 9.6 g/dL — ABNORMAL LOW (ref 13.0–17.0)
MCH: 30.3 pg (ref 26.0–34.0)
MCHC: 33.8 g/dL (ref 30.0–36.0)
MCV: 89.6 fL (ref 78.0–100.0)
Platelets: 203 10*3/uL (ref 150–400)
RBC: 3.17 MIL/uL — ABNORMAL LOW (ref 4.22–5.81)
RDW: 14.1 % (ref 11.5–15.5)
WBC: 12.5 10*3/uL — ABNORMAL HIGH (ref 4.0–10.5)

## 2017-03-13 LAB — BASIC METABOLIC PANEL
Anion gap: 9 (ref 5–15)
BUN: 52 mg/dL — ABNORMAL HIGH (ref 6–20)
CO2: 24 mmol/L (ref 22–32)
Calcium: 8 mg/dL — ABNORMAL LOW (ref 8.9–10.3)
Chloride: 106 mmol/L (ref 101–111)
Creatinine, Ser: 2.22 mg/dL — ABNORMAL HIGH (ref 0.61–1.24)
GFR calc Af Amer: 36 mL/min — ABNORMAL LOW (ref >60)
GFR calc non Af Amer: 31 mL/min — ABNORMAL LOW (ref >60)
Glucose, Bld: 117 mg/dL — ABNORMAL HIGH (ref 65–99)
Potassium: 3.3 mmol/L — ABNORMAL LOW (ref 3.5–5.1)
Sodium: 139 mmol/L (ref 135–145)

## 2017-03-13 LAB — HEPARIN LEVEL (UNFRACTIONATED)
Heparin Unfractionated: 0.18 IU/mL — ABNORMAL LOW (ref 0.30–0.70)
Heparin Unfractionated: 0.27 IU/mL — ABNORMAL LOW (ref 0.30–0.70)

## 2017-03-13 LAB — TROPONIN I: Troponin I: 18.72 ng/mL (ref <0.03)

## 2017-03-13 LAB — GLUCOSE, CAPILLARY
Glucose-Capillary: 112 mg/dL — ABNORMAL HIGH (ref 65–99)
Glucose-Capillary: 152 mg/dL — ABNORMAL HIGH (ref 65–99)
Glucose-Capillary: 109 mg/dL — ABNORMAL HIGH (ref 65–99)
Glucose-Capillary: 107 mg/dL — ABNORMAL HIGH (ref 65–99)

## 2017-03-13 LAB — PROCALCITONIN: Procalcitonin: 0.87 ng/mL

## 2017-03-13 LAB — UREA NITROGEN, URINE: Urea Nitrogen, Ur: 557 mg/dL

## 2017-03-13 MED ORDER — HEPARIN (PORCINE) IN NACL 100-0.45 UNIT/ML-% IJ SOLN
1850.0000 [IU]/h | INTRAMUSCULAR | Status: DC
  Administered 2017-03-13 – 2017-03-14 (×2): 1750 [IU]/h via INTRAVENOUS
  Filled 2017-03-13 (×3): qty 250

## 2017-03-13 MED ORDER — POTASSIUM CHLORIDE CRYS ER 20 MEQ PO TBCR
40.0000 meq | EXTENDED_RELEASE_TABLET | Freq: Every day | ORAL | Status: DC
  Administered 2017-03-13: 40 meq via ORAL
  Filled 2017-03-13: qty 2

## 2017-03-13 MED ORDER — CITALOPRAM HYDROBROMIDE 20 MG PO TABS
20.0000 mg | ORAL_TABLET | Freq: Every day | ORAL | Status: DC
  Administered 2017-03-13 – 2017-03-21 (×9): 20 mg via ORAL
  Filled 2017-03-13 (×9): qty 1

## 2017-03-13 MED ORDER — HEPARIN (PORCINE) IN NACL 100-0.45 UNIT/ML-% IJ SOLN: 1600.0000 [IU]/h | INTRAMUSCULAR | Status: DC

## 2017-03-13 NOTE — Progress Notes (Signed)
Pharmacist Heart Failure Core Measure Documentation  Assessment: Douglas Cannon has an EF documented as 30-35% on 03/12/17 by ECHO.  Rationale: Heart failure patients with left ventricular systolic dysfunction (LVSD) and an EF < 40% should be prescribed an angiotensin converting enzyme inhibitor (ACEI) or angiotensin receptor blocker (ARB) at discharge unless a contraindication is documented in the medical record.  This patient is not currently on an ACEI or ARB for HF.  This note is being placed in the record in order to provide documentation that a contraindication to the use of these agents is present for this encounter.  ACE Inhibitor or Angiotensin Receptor Blocker is contraindicated (specify all that apply)    ACEI allergy AND ARB allergy   Angioedema   Moderate or severe aortic stenosis   Hyperkalemia   Hypotension   Renal artery stenosis   Worsening renal function, preexisting renal disease or dysfunction    Douglas Cannon 03/13/2017 7:58 AM

## 2017-03-13 NOTE — Plan of Care (Signed)
Problem: Pain Managment: Goal: General experience of comfort will improve Outcome: Progressing Patient has had no complaints of pain.   Problem: Tissue Perfusion: Goal: Risk factors for ineffective tissue perfusion will decrease Outcome: Progressing Patient fluctuated throughout the day on BIPAP 70-100% FiO2. Brief break on NRB, only last 10 minutes at beginning of shift. O2 saturations started to fall into the low 80's. Heparin and Nitro gtt continued per [employer].   Problem: Fluid Volume: Goal: Ability to maintain a balanced intake and output will improve Outcome: Progressing Patient compliant with using urinal for accurate I & O's.   Problem: Nutrition: Goal: Adequate nutrition will be maintained Outcome: Not Progressing Patient has had minimal to eat do to requiring continuous BIPAP. While on NRB, patient ate 1 cup of apple sauce.

## 2017-03-13 NOTE — Progress Notes (Signed)
VASCULAR LAB PRELIMINARY  PRELIMINARY  PRELIMINARY  PRELIMINARY  Bilateral lower extremity venous duplex completed.   Preliminary report:  Right - No evidence of DVT,superficial thrombosis, or Baker's cyst. Left  - Positive for a minute chronic thrombus in a branch of the posterior tibial vein ant the ankle. No evidence of superficial thrombosis or Baker's cyst  Chyanna Flock, RVS 03/13/2017, 4:24 PM

## 2017-03-13 NOTE — Progress Notes (Addendum)
ANTICOAGULATION CONSULT NOTE -Follow up   Pharmacy Consult for heparin Indication: chest pain/ACS  No Known Allergies  Patient Measurements: Height:  (177.8 cm) Weight: 191 lb 12.8 oz (87 kg) IBW/kg (Calculated) : 73 Heparin Dosing Weight: 77.1 kg  Vital Signs: Temp: 98.4 F (36.9 C) (04/29 0752) Temp Source: Axillary (04/29 0752) BP: 114/75 (04/29 0752) Pulse Rate: 88 (04/29 0752)  Labs:  Recent Labs  03/11/17 0920 03/11/17 1610  03/11/17 2050 03/11/17 2352 03/12/17 0343 03/13/17 0506  HGB 12.4* 11.9*  --   --   --  9.7* 9.6*  HCT 36.6* 35.0*  --   --   --  29.4* 28.4*  PLT 289  --   --   --   --  212 203  HEPARINUNFRC  --   --   < >  --  0.34 0.32 0.18*  CREATININE 2.86* 2.90*  --   --   --  2.82* 2.22*  TROPONINI  --   --   < > 29.89*  --  34.91* 18.72*  < > = values in this interval not displayed.  Estimated Creatinine Clearance: 37 mL/min (A) (by C-G formula based on SCr of 2.22 mg/dL (H)).   Medical History: Past Medical History:  Diagnosis Date  . Anemia   . Anxiety   . CKD (chronic kidney disease) stage 3, GFR 30-59 ml/min   . Diastolic heart failure [facility])    Episode December 2017 in the setting of pneumonia  . Essential hypertension   . History of pneumonia   . Hyperlipemia   . Osteomyelitis (HCC)   . Type 2 diabetes mellitus (HCC)     Medications:  See medication history.  Was not on anticoagulation PTA  Assessment: 61 yo M admitted on 03/11/2017 with NSTEMI. For now, medical management including heparin. Troponin starting to trend back down. Now with question of PE.  HL trend down to SUBtherapeutic level at 0.18. No interruptions in gtt or bleeding issues per RN. Hgb has trended down but stable today, plt wnl and stable.   Goal of Therapy:  Heparin level 0.3-0.7 units/ml Monitor platelets by anticoagulation protocol: Yes   Plan:  Increase heparin gtt to 1600 units/hr F/u 6 hr HL Daily HL and CBC while on heparin F/u PE work  up   [employer] Bonnye Fava, PharmD, BCPS, CPP Clinical Pharmacist Pager: [phone] Phone: [phone] 03/13/2017 8:08 AM  ADDENDUM: - HL again SUBtherapeutic at 0.27, no issues with gtt per RN - Increase heparin gtt to 1750 units/hr - F/u 6 hr HL  Shuronda Santino K. Bonnye Fava, PharmD, BCPS, CPP Clinical Pharmacist Pager: [phone] Phone: [phone] 03/13/2017 3:10 PM

## 2017-03-13 NOTE — Progress Notes (Signed)
Subjective:  Breathing better Venturi mask on no chest pain   Objective:  Vitals:   03/13/17 0545 03/13/17 0752 03/13/17 0821 03/13/17 0823  BP:  114/75    Pulse:  88  85  Resp:  (!) 24  (!) 21  Temp:  98.4 F (36.9 C)    TempSrc:  Axillary    SpO2:  99% 100% 98%  Weight: 87 kg (191 lb 12.8 oz)     Height:  (1.778 m)       Intake/Output from previous day:  Intake/Output Summary (Last 24 hours) at 03/13/17 1610 Last data filed at 03/13/17 9604  Gross per 24 hour  Intake           733.63 ml  Output             3625 ml  Net         -2891.37 ml    Physical Exam: Affect appropriate Chronically ill white male  HEENT: normal Neck supple with no adenopathy JVP normal no bruits no thyromegaly Lungs clear with no wheezing and good diaphragmatic motion Heart:  S1/S2 no murmur, no rub, gallop or click PMI normal Abdomen: benighn, BS positve, no tenderness, no AAA no bruit.  No HSM or HJR Distal pulses intact with no bruits No edema Neuro non-focal Skin warm and dry No muscular weakness   Lab Results: Basic Metabolic Panel:  Recent Labs  54/09/81 0343 03/13/17 0506  NA 138 139  K 3.8 3.3*  CL 106 106  CO2 22 24  GLUCOSE 177* 117*  BUN 70* 52*  CREATININE 2.82* 2.22*  CALCIUM 8.2* 8.0*  MG 2.0  --   PHOS 4.3  --    Liver Function Tests:  Recent Labs  03/11/17 0920 03/12/17 0343  AST 70* 78*  ALT 18 16*  ALKPHOS 50 37*  BILITOT 0.6 0.2*  PROT 6.7 5.4*  ALBUMIN 3.1* 2.4*   No results for input(s): LIPASE, AMYLASE in the last 72 hours. CBC:  Recent Labs  03/11/17 0920  03/12/17 0343 03/13/17 0506  WBC 27.3*  --  15.8* 12.5*  NEUTROABS 24.9*  --  13.3*  --   HGB 12.4*  < > 9.7* 9.6*  HCT 36.6*  < > 29.4* 28.4*  MCV 90.4  --  89.1 89.6  PLT 289  --  212 203  < > = values in this interval not displayed. Cardiac Enzymes:  Recent Labs  03/11/17 2050 03/12/17 0343 03/13/17 0506  TROPONINI 29.89* 34.91* 18.72*   BNP: Invalid  input(s): POCBNP D-Dimer:  Recent Labs  03/11/17 1753  DDIMER 0.48   Hemoglobin A1C:  Recent Labs  03/11/17 1543  HGBA1C 6.3*   Fasting Lipid Panel:  Recent Labs  03/12/17 0343  CHOL 103  HDL 45  LDLCALC 35  TRIG 114  CHOLHDL 2.3   Thyroid Function Tests:  Recent Labs  03/12/17 0343  TSH 2.600    Imaging: US Renal  Result Date: 03/12/2017 CLINICAL DATA:  Acute renal failure superimposed on stage 3 chronic kidney disease. EXAM: RENAL / URINARY TRACT ULTRASOUND COMPLETE COMPARISON:  12/21/2015 FINDINGS: Right Kidney: Length: 11.3 cm. Mildly increased renal parenchymal echogenicity. No mass or hydronephrosis visualized. Left Kidney: Length: 11.0 cm. Mildly increased renal parenchymal echogenicity. No mass or hydronephrosis visualized. Bladder: Appears normal for degree of bladder distention. IMPRESSION: Mildly increased renal parenchymal echogenicity, consistent with medical renal disease. No evidence of hydronephrosis. Electronically Signed   By: Alver Sorrow.D.  On: 03/12/2017 13:27   Dg Chest Port 1 View  Result Date: 03/12/2017 CLINICAL DATA:  Heart failure, chest pain EXAM: PORTABLE CHEST 1 VIEW COMPARISON:  03/11/2017 FINDINGS: There is bilateral diffuse interstitial thickening. There is no focal parenchymal opacity. There is no pleural effusion or pneumothorax. The heart and mediastinum are stable. The osseous structures are unremarkable. IMPRESSION: Bilateral diffuse interstitial thickening concerning for mild pulmonary edema. Electronically Signed   By: Elige Ko   On: 03/12/2017 11:11   Dg Chest Portable 1 View  Result Date: 03/11/2017 CLINICAL DATA:  Shortness of breath and cough EXAM: PORTABLE CHEST 1 VIEW COMPARISON:  December 26, 2015 FINDINGS: There is airspace consolidation throughout much of the right mid and lower lung zone. There is interstitial edema underlying, primarily in the perihilar regions. There is patchy alveolar edema in the left mid lower  lung zones. Heart is enlarged with pulmonary venous hypertension. No adenopathy. There is a small right pleural effusion, layering. IMPRESSION: Evidence a degree of underlying congestive heart failure. Suspect pneumonia throughout the right mid and lower lung zones superimposed. No adenopathy evident. Electronically Signed   By: Bretta Bang III M.D.   On: 03/11/2017 09:43    Cardiac Studies:  ECG: NSR no acute ST/T Wave changes    Telemetry:  NSR no VT 03/13/2017 personally reivewed   Echo: pending normal EF in February   Medications:   . aspirin EC  81 mg Oral Daily  . chlorhexidine  15 mL Mouth Rinse BID  . furosemide  40 mg Intravenous Q12H  . insulin aspart  0-9 Units Subcutaneous TID WC  . ipratropium-albuterol  3 mL Nebulization Q8H  . mouth rinse  15 mL Mouth Rinse q12n4p  . rosuvastatin  5 mg Oral QHS  . sodium chloride flush  3 mL Intravenous Q12H     . sodium chloride    . azithromycin Stopped (03/12/17 1031)  . cefTRIAXone (ROCEPHIN)  IV 1 g (03/13/17 0844)  . heparin 1,600 Units/hr (03/13/17 0800)  . nitroGLYCERIN 5 mcg/min (03/11/17 2129)    Assessment/Plan:  SEMI:  No chest pain ECG no acute changes but troponin now in the 34.9 range would suggest circumlfex coronary artery occlusion.Echo with RWMA;s and EF 30-35% CR is improving discussed Possible heart cath with patient given moderate decrease in EF and clinical CHF. Will consider right and left cath on Tuesday if Cr continues to improve. Timing and decision to be made on rounds Monday not on board yet  Chol:  On statin  CRF:  Making urine should get renal US while in hospital  Cr improved 2.9 -> 2.22   Pneumonia: with respiratory failure on rocephin and azithromycin   Charlton Haws 03/13/2017, 9:24 AM

## 2017-03-13 NOTE — Progress Notes (Signed)
PROGRESS NOTE    Douglas Cannon  ZOX:[id number] DOB: 06-09-1956 DOA: 03/11/2017 PCP: Dwana Melena, MD   Brief Narrative: 61 y.o. male with medical history significant of HTN, HLD, DM Type 2, BPH, CKD Stage 3, Anemia, Anxiety, and ?Diastolic HF with other comorbids who presented to the [facility] ED with worsening shortness of breath and leg swelling for about 2 days. On admission patient was found to have elevated troponin, BNP concerning for CHF and possible pneumonia. Patient was started on heparin drip, IV antibiotics, nitro drip and transferred to [facility] for further evaluation. Evaluated by cardiologist and pulmonary critical care team.  Assessment & Plan:  #  NSTEMI: Peak troponin level 34.9. Patient with shortness of breath and leg swelling on admission. He does not have chest pain. - Currently on IV heparin and IV nitroglycerin. May plan for  cardiac cath on Tuesday as per cardiologist. Echo with low EF and wall motion abnormalities.  Continue to monitor.  -Continue aspirin, statin. -Cardiology consult appreciated  #Acute systolic congestive heart failure. Patient with elevated BNP and lower extremity swelling. Echo showed EF of 30-35% Currently on IV Lasix twice a day. Continue to monitor BMP, weight and electrolytes.  #Acute hypoxic respiratory failure due to possible pneumonia, PE: Evaluation ongoing by pulmonologist. Currently on heparin IV. Switched to eventually mosque today from BiPAP. Follow-up venous duplex lower extremity. Continue to treat for pneumonia. Try to wean to nasal cannula.  Respiratory therapy referred. -Pulmonary consult appreciated  #Community acquired pneumonia: Continue ceftriaxone and azithromycin. Respiratory viral studies positive for rhinovirus.  #Possible pulmonary embolism: Exactly unknown. Unable to obtain CT angiogram because of renal failure. For now continue IV heparin. Pending venous duplex. PCCM following.  #Acute on chronic kidney disease  stage III: Patient has baseline serum creatinine level around 1.5-2. UA unremarkable. Ultrasound of kidneys with medical renal disease. Serum creatinine level trending down. Currently on IV Lasix. Continue to monitor BMP. Avoid nephrotoxins.   #Hyperlipidemia: Continue statin  #Type 2 diabetes: Continue sliding scale. Monitor blood sugar level. Patient's oral intake is not great because of being on BiPAP.  DVT prophylaxis: IV heparin Code Status: Full code Family Communication: Discussed with the patient's wife at bedside Disposition Plan: Likely discharge home in 2-3 days depending on clinical improvement    Consultants:   Cardiologist  Pulmonary critical care  Procedures: Noninvasive ventilation Antimicrobials: Azithromycin and ceftriaxone since 4/27  Subjective: Patient was seen and examined at bedside. Shortness of breath has improved. Denied chest pain, nausea, vomiting, headache or dizziness. Was on BiPAP last night and changed to venturi mask this morning. Wife at bedside  Objective: Vitals:   03/13/17 0545 03/13/17 0752 03/13/17 0821 03/13/17 0823  BP:  114/75    Pulse:  88  85  Resp:  (!) 24  (!) 21  Temp:  98.4 F (36.9 C)    TempSrc:  Axillary    SpO2:  99% 100% 98%  Weight: 87 kg (191 lb 12.8 oz)     Height:  (1.778 m)       Intake/Output Summary (Last 24 hours) at 03/13/17 1001 Last data filed at 03/13/17 0952  Gross per 24 hour  Intake           415.16 ml  Output             3425 ml  Net         -3009.84 ml   Filed Weights   03/11/17 0915 03/12/17 0536 03/13/17 0545  Weight: 77.1 kg (170 lb) 85.8 kg (189 lb 1.6 oz) 87 kg (191 lb 12.8 oz)    Examination:  General exam: Lying on bed comfortable, unmasked for oxygenation Respiratory system: Clear bilaterally, respiratory effort normal.  Cardiovascular system: Regular rate rhythm, S1-S2 normal. Trace lower extremity edema Gastrointestinal system: Abdomen is nondistended, soft and nontender.  Normal bowel sounds heard. Central nervous system: Alert awake and following commands Extremities: Symmetric 5 x 5 power. Skin: No rashes, lesions or ulcers Psychiatry: Judgement and insight appear normal. Mood & affect appropriate.     Data Reviewed: I have personally reviewed following labs and imaging studies  CBC:  Recent Labs Lab 03/11/17 0920 03/11/17 0922 03/12/17 0343 03/13/17 0506  WBC 27.3*  --  15.8* 12.5*  NEUTROABS 24.9*  --  13.3*  --   HGB 12.4* 11.9* 9.7* 9.6*  HCT 36.6* 35.0* 29.4* 28.4*  MCV 90.4  --  89.1 89.6  PLT 289  --  212 203   Basic Metabolic Panel:  Recent Labs Lab 03/11/17 0920 03/11/17 0922 03/12/17 0343 03/13/17 0506  NA 136 136 138 139  K 4.4 4.3 3.8 3.3*  CL 102 105 106 106  CO2 17*  --  22 24  GLUCOSE 265* 259* 177* 117*  BUN 64* 57* 70* 52*  CREATININE 2.86* 2.90* 2.82* 2.22*  CALCIUM 8.7*  --  8.2* 8.0*  MG  --   --  2.0  --   PHOS  --   --  4.3  --    GFR: Estimated Creatinine Clearance: 37 mL/min (A) (by C-G formula based on SCr of 2.22 mg/dL (H)). Liver Function Tests:  Recent Labs Lab 03/11/17 0920 03/12/17 0343  AST 70* 78*  ALT 18 16*  ALKPHOS 50 37*  BILITOT 0.6 0.2*  PROT 6.7 5.4*  ALBUMIN 3.1* 2.4*   No results for input(s): LIPASE, AMYLASE in the last 168 hours. No results for input(s): AMMONIA in the last 168 hours. Coagulation Profile: No results for input(s): INR, PROTIME in the last 168 hours. Cardiac Enzymes:  Recent Labs Lab 03/11/17 1543 03/11/17 2050 03/12/17 0343 03/13/17 0506  TROPONINI 16.91* 29.89* 34.91* 18.72*   BNP (last 3 results) No results for input(s): PROBNP in the last 8760 hours. HbA1C:  Recent Labs  03/11/17 1543  HGBA1C 6.3*   CBG:  Recent Labs Lab 03/12/17 0749 03/12/17 1203 03/12/17 1610 03/12/17 2109 03/13/17 0752  GLUCAP 169* 183* 127* 121* 112*   Lipid Profile:  Recent Labs  03/12/17 0343  CHOL 103  HDL 45  LDLCALC 35  TRIG 114  CHOLHDL 2.3    Thyroid Function Tests:  Recent Labs  03/12/17 0343  TSH 2.600  FREET4 0.92   Anemia Panel: No results for input(s): VITAMINB12, FOLATE, FERRITIN, TIBC, IRON, RETICCTPCT in the last 72 hours. Sepsis Labs:  Recent Labs Lab 03/11/17 0920 03/11/17 1223 03/11/17 1753 03/13/17 0506  PROCALCITON  --   --  2.27 0.87  LATICACIDVEN 4.0* 2.0* 1.9  --     Recent Results (from the past 240 hour(s))  Culture, blood (routine x 2)     Status: None (Preliminary result)   Collection Time: 03/11/17  9:20 AM  Result Value Ref Range Status   Specimen Description BLOOD LEFT ARM  Final   Special Requests   Final    BOTTLES DRAWN AEROBIC AND ANAEROBIC Blood Culture results may not be optimal due to an inadequate volume of blood received in culture bottles   Culture NO GROWTH <  24 HOURS  Final   Report Status PENDING  Incomplete  Culture, blood (routine x 2)     Status: None (Preliminary result)   Collection Time: 03/11/17 10:22 AM  Result Value Ref Range Status   Specimen Description BLOOD LEFT HAND  Final   Special Requests   Final    BOTTLES DRAWN AEROBIC AND ANAEROBIC Blood Culture results may not be optimal due to an inadequate volume of blood received in culture bottles   Culture NO GROWTH < 24 HOURS  Final   Report Status PENDING  Incomplete  Respiratory Panel by PCR     Status: Abnormal   Collection Time: 03/11/17  9:31 PM  Result Value Ref Range Status   Adenovirus NOT DETECTED NOT DETECTED Final   Coronavirus 229E NOT DETECTED NOT DETECTED Final   Coronavirus HKU1 NOT DETECTED NOT DETECTED Final   Coronavirus NL63 NOT DETECTED NOT DETECTED Final   Coronavirus OC43 NOT DETECTED NOT DETECTED Final   Metapneumovirus NOT DETECTED NOT DETECTED Final   Rhinovirus / Enterovirus DETECTED (A) NOT DETECTED Final   Influenza A NOT DETECTED NOT DETECTED Final   Influenza B NOT DETECTED NOT DETECTED Final   Parainfluenza Virus 1 NOT DETECTED NOT DETECTED Final   Parainfluenza Virus 2 NOT  DETECTED NOT DETECTED Final   Parainfluenza Virus 3 NOT DETECTED NOT DETECTED Final   Parainfluenza Virus 4 NOT DETECTED NOT DETECTED Final   Respiratory Syncytial Virus NOT DETECTED NOT DETECTED Final   Bordetella pertussis NOT DETECTED NOT DETECTED Final   Chlamydophila pneumoniae NOT DETECTED NOT DETECTED Final   Mycoplasma pneumoniae NOT DETECTED NOT DETECTED Final  MRSA PCR Screening     Status: None   Collection Time: 03/11/17  9:31 PM  Result Value Ref Range Status   MRSA by PCR NEGATIVE NEGATIVE Final    Comment:        The GeneXpert MRSA Assay (FDA approved for NASAL specimens only), is one component of a comprehensive MRSA colonization surveillance program. It is not intended to diagnose MRSA infection nor to guide or monitor treatment for MRSA infections.          Radiology Studies: US Renal  Result Date: 03/12/2017 CLINICAL DATA:  Acute renal failure superimposed on stage 3 chronic kidney disease. EXAM: RENAL / URINARY TRACT ULTRASOUND COMPLETE COMPARISON:  12/21/2015 FINDINGS: Right Kidney: Length: 11.3 cm. Mildly increased renal parenchymal echogenicity. No mass or hydronephrosis visualized. Left Kidney: Length: 11.0 cm. Mildly increased renal parenchymal echogenicity. No mass or hydronephrosis visualized. Bladder: Appears normal for degree of bladder distention. IMPRESSION: Mildly increased renal parenchymal echogenicity, consistent with medical renal disease. No evidence of hydronephrosis. Electronically Signed   By: Myles Rosenthal M.D.   On: 03/12/2017 13:27   Dg Chest Port 1 View  Result Date: 03/12/2017 CLINICAL DATA:  Heart failure, chest pain EXAM: PORTABLE CHEST 1 VIEW COMPARISON:  03/11/2017 FINDINGS: There is bilateral diffuse interstitial thickening. There is no focal parenchymal opacity. There is no pleural effusion or pneumothorax. The heart and mediastinum are stable. The osseous structures are unremarkable. IMPRESSION: Bilateral diffuse interstitial  thickening concerning for mild pulmonary edema. Electronically Signed   By: Elige Ko   On: 03/12/2017 11:11        Scheduled Meds: . aspirin EC  81 mg Oral Daily  . chlorhexidine  15 mL Mouth Rinse BID  . furosemide  40 mg Intravenous Q12H  . insulin aspart  0-9 Units Subcutaneous TID WC  . ipratropium-albuterol  3 mL Nebulization  Q8H  . mouth rinse  15 mL Mouth Rinse q12n4p  . rosuvastatin  5 mg Oral QHS  . sodium chloride flush  3 mL Intravenous Q12H   Continuous Infusions: . sodium chloride    . azithromycin 500 mg (03/13/17 0939)  . cefTRIAXone (ROCEPHIN)  IV Stopped (03/13/17 0914)  . heparin 1,600 Units/hr (03/13/17 0800)  . nitroGLYCERIN 5 mcg/min (03/11/17 2129)     LOS: 2 days    Dario Yono Jaynie Collins, MD [facility] Pager [phone]  If 7PM-7AM, please contact night-coverage [email or web address] Password [email or web address] 03/13/2017, 10:01 AM

## 2017-03-14 ENCOUNTER — Ambulatory Visit (HOSPITAL_COMMUNITY): Payer: BLUE CROSS/BLUE SHIELD

## 2017-03-14 ENCOUNTER — Inpatient Hospital Stay (HOSPITAL_COMMUNITY): Payer: BLUE CROSS/BLUE SHIELD

## 2017-03-14 DIAGNOSIS — I5043 Acute on chronic combined systolic (congestive) and diastolic (congestive) heart failure: Secondary | ICD-10-CM

## 2017-03-14 DIAGNOSIS — I509 Heart failure, unspecified: Secondary | ICD-10-CM

## 2017-03-14 LAB — BASIC METABOLIC PANEL
Anion gap: 9 (ref 5–15)
BUN: 43 mg/dL — ABNORMAL HIGH (ref 6–20)
CO2: 24 mmol/L (ref 22–32)
Calcium: 7.7 mg/dL — ABNORMAL LOW (ref 8.9–10.3)
Chloride: 104 mmol/L (ref 101–111)
Creatinine, Ser: 2.08 mg/dL — ABNORMAL HIGH (ref 0.61–1.24)
GFR calc Af Amer: 38 mL/min — ABNORMAL LOW (ref >60)
GFR calc non Af Amer: 33 mL/min — ABNORMAL LOW (ref >60)
Glucose, Bld: 116 mg/dL — ABNORMAL HIGH (ref 65–99)
Potassium: 3.2 mmol/L — ABNORMAL LOW (ref 3.5–5.1)
Sodium: 137 mmol/L (ref 135–145)

## 2017-03-14 LAB — CBC
HCT: 24.8 % — ABNORMAL LOW (ref 39.0–52.0)
Hemoglobin: 8.6 g/dL — ABNORMAL LOW (ref 13.0–17.0)
MCH: 30.9 pg (ref 26.0–34.0)
MCHC: 34.7 g/dL (ref 30.0–36.0)
MCV: 89.2 fL (ref 78.0–100.0)
Platelets: 188 10*3/uL (ref 150–400)
RBC: 2.78 MIL/uL — ABNORMAL LOW (ref 4.22–5.81)
RDW: 13.6 % (ref 11.5–15.5)
WBC: 10.5 10*3/uL (ref 4.0–10.5)

## 2017-03-14 LAB — GLUCOSE, CAPILLARY
Glucose-Capillary: 107 mg/dL — ABNORMAL HIGH (ref 65–99)
Glucose-Capillary: 144 mg/dL — ABNORMAL HIGH (ref 65–99)
Glucose-Capillary: 146 mg/dL — ABNORMAL HIGH (ref 65–99)
Glucose-Capillary: 190 mg/dL — ABNORMAL HIGH (ref 65–99)

## 2017-03-14 LAB — LEGIONELLA PNEUMOPHILA SEROGP 1 UR AG: L. pneumophila Serogp 1 Ur Ag: NEGATIVE

## 2017-03-14 LAB — MAGNESIUM: Magnesium: 1.8 mg/dL (ref 1.7–2.4)

## 2017-03-14 LAB — HEPARIN LEVEL (UNFRACTIONATED): Heparin Unfractionated: 0.36 IU/mL (ref 0.30–0.70)

## 2017-03-14 MED ORDER — SODIUM CHLORIDE 0.9 % IV SOLN: 250.0000 mL | INTRAVENOUS | Status: DC | PRN

## 2017-03-14 MED ORDER — ASPIRIN 81 MG PO CHEW
81.0000 mg | CHEWABLE_TABLET | ORAL | Status: AC
  Administered 2017-03-15: 81 mg via ORAL
  Filled 2017-03-14: qty 1

## 2017-03-14 MED ORDER — AZITHROMYCIN 250 MG PO TABS: 500.0000 mg | ORAL_TABLET | Freq: Every day | ORAL | Status: DC

## 2017-03-14 MED ORDER — MAGNESIUM SULFATE 2 GM/50ML IV SOLN
2.0000 g | Freq: Once | INTRAVENOUS | Status: AC
  Administered 2017-03-14: 2 g via INTRAVENOUS
  Filled 2017-03-14: qty 50

## 2017-03-14 MED ORDER — SODIUM CHLORIDE 0.9% FLUSH: 3.0000 mL | INTRAVENOUS | Status: DC | PRN

## 2017-03-14 MED ORDER — SODIUM CHLORIDE 0.9% FLUSH
3.0000 mL | Freq: Two times a day (BID) | INTRAVENOUS | Status: DC
  Administered 2017-03-14: 3 mL via INTRAVENOUS

## 2017-03-14 MED ORDER — POTASSIUM CHLORIDE CRYS ER 20 MEQ PO TBCR
40.0000 meq | EXTENDED_RELEASE_TABLET | Freq: Two times a day (BID) | ORAL | Status: DC
  Administered 2017-03-14 (×2): 40 meq via ORAL
  Filled 2017-03-14 (×2): qty 2

## 2017-03-14 MED ORDER — ZOLPIDEM TARTRATE 5 MG PO TABS
5.0000 mg | ORAL_TABLET | Freq: Once | ORAL | Status: AC
  Administered 2017-03-14: 5 mg via ORAL
  Filled 2017-03-14: qty 1

## 2017-03-14 MED ORDER — SODIUM CHLORIDE 0.9 % IV SOLN
INTRAVENOUS | Status: DC
  Administered 2017-03-15: 06:00:00 via INTRAVENOUS

## 2017-03-14 MED ORDER — POTASSIUM CHLORIDE CRYS ER 20 MEQ PO TBCR
40.0000 meq | EXTENDED_RELEASE_TABLET | Freq: Once | ORAL | Status: AC
  Administered 2017-03-14: 40 meq via ORAL
  Filled 2017-03-14: qty 2

## 2017-03-14 NOTE — Progress Notes (Addendum)
PROGRESS NOTE    Douglas Cannon  ZOX:[id number] DOB: [birth year] DOA: 03/11/2017 PCP: Dwana Melena, MD   Brief Narrative: 61 y.o. male with medical history significant of HTN, HLD, DM Type 2, BPH, CKD Stage 3, Anemia, Anxiety, and ?Diastolic HF with other comorbids who presented to the [facility] ED with worsening shortness of breath and leg swelling for about 2 days. On admission patient was found to have elevated troponin, BNP concerning for CHF and possible pneumonia. Patient was started on heparin drip, IV antibiotics, nitro drip and transferred to [facility] for further evaluation. Evaluated by cardiologist and pulmonary critical care team.  Assessment & Plan:  #  NSTEMI: Peak troponin level 34.9. Patient with shortness of breath and leg swelling on admission. He does not have chest pain. - Currently on IV heparin and IV nitroglycerin. Plan for  cardiac cath on tomorrow as per cardiologist. Echo with low EF and wall motion abnormalities.  Continue to monitor.  -Continue aspirin, statin. -Cardiology consult appreciated  #Acute systolic congestive heart failure. Patient with elevated BNP and lower extremity swelling. Echo showed EF of 30-35% LE swelling improved. Currently on IV Lasix as per cardiology. Continue to monitor BMP, weight and electrolytes.  #Acute hypoxic respiratory failure due to possible pneumonia, PE: Evaluation ongoing by pulmonologist. Currently on heparin IV. Currently on facemask for oxygenation, likely able to switch to resume, today.  -doppler LE showed: Left  - Positive for a minute chronic thrombus in a branch of the posterior tibial vein ant the ankle.  Continue to treat for pneumonia.  Respiratory therapy referred. -Pulmonary consult appreciated  #Community acquired pneumonia: Continue ceftriaxone and azithromycin. Respiratory viral studies positive for rhinovirus.  #Possible pulmonary embolism: Exactly unknown. Unable to obtain CT angiogram because of renal  failure. For now continue IV heparin. Pending venous duplex. PCCM following.  #Acute on chronic kidney disease stage III: Patient has baseline serum creatinine level around 1.5-2. UA unremarkable. Ultrasound of kidneys with medical renal disease. Serum creatinine level trending down tp 2.08 today. Currently on IV Lasix. Continue to monitor BMP. Avoid nephrotoxins.   #Hypokalemia in the setting of diuretics: Continue oral potassium chloride twice a day. Ordered 2 g of IV magnesium. Monitor labs.  #Hyperlipidemia: Continue statin  #Type 2 diabetes: Continue sliding scale. Monitor blood sugar level. Patient's oral intake is not great because of being on BiPAP.  DVT prophylaxis: IV heparin Code Status: Full code Family Communication: Discussed with the patient's wife at bedside Disposition Plan: Likely discharge home in 2-3 days depending on clinical improvement    Consultants:   Cardiologist  Pulmonary critical care  Procedures: Noninvasive ventilation Antimicrobials: Azithromycin and ceftriaxone since 4/27  Subjective: Patient was seen and examined at bedside. Reported significant improvement. Denied shortness of breath, chest pain, nausea, vomiting, chest headache or dizziness. Slept well last night.  Objective: Vitals:   03/14/17 0325 03/14/17 0341 03/14/17 0546 03/14/17 0727  BP:  121/81 115/77 127/77  Pulse: 79 87 87 90  Resp: (!) 25  Temp:   98.5 F (36.9 C) 98.4 F (36.9 C)  TempSrc:   Axillary Oral  SpO2: 99% 100% 100% 97%  Weight:   81.1 kg (178 lb 11.2 oz)   Height:        Intake/Output Summary (Last 24 hours) at 03/14/17 1030 Last data filed at 03/14/17 0900  Gross per 24 hour  Intake              363 ml  Output             1075 ml  Net             -712 ml   Filed Weights   03/12/17 0536 03/13/17 0545 03/14/17 0546  Weight: 85.8 kg (189 lb 1.6 oz) 87 kg (191 lb 12.8 oz) 81.1 kg (178 lb 11.2 oz)    Examination:  General exam: Lying  comfortably, has facial mask for oxygenation Respiratory system: Bibasal crackles, respiratory effort normal, no wheezing  Cardiovascular system: Regular rate rhythm, S1-S2 normal. No LE edema. Gastrointestinal system: Abdomen is nondistended, soft and nontender. Normal bowel sounds heard. Central nervous system: Alert awake and following commands Extremities: Symmetric 5 x 5 power. Skin: No rashes, lesions or ulcers Psychiatry: Judgement and insight appear normal. Mood & affect appropriate.     Data Reviewed: I have personally reviewed following labs and imaging studies  CBC:  Recent Labs Lab 03/11/17 0920 03/11/17 0922 03/12/17 0343 03/13/17 0506 03/14/17 0313  WBC 27.3*  --  15.8* 12.5* 10.5  NEUTROABS 24.9*  --  13.3*  --   --   HGB 12.4* 11.9* 9.7* 9.6* 8.6*  HCT 36.6* 35.0* 29.4* 28.4* 24.8*  MCV 90.4  --  89.1 89.6 89.2  PLT 289  --  212 203 188   Basic Metabolic Panel:  Recent Labs Lab 03/11/17 0920 03/11/17 0922 03/12/17 0343 03/13/17 0506 03/14/17 0313  NA 136 136 138 139 137  K 4.4 4.3 3.8 3.3* 3.2*  CL 102 105 106 106 104  CO2 17*  --  GLUCOSE 265* 259* 177* 117* 116*  BUN 64* 57* 70* 52* 43*  CREATININE 2.86* 2.90* 2.82* 2.22* 2.08*  CALCIUM 8.7*  --  8.2* 8.0* 7.7*  MG  --   --  2.0  --  1.8  PHOS  --   --  4.3  --   --    GFR: Estimated Creatinine Clearance: 39.5 mL/min (A) (by C-G formula based on SCr of 2.08 mg/dL (H)). Liver Function Tests:  Recent Labs Lab 03/11/17 0920 03/12/17 0343  AST 70* 78*  ALT 18 16*  ALKPHOS 50 37*  BILITOT 0.6 0.2*  PROT 6.7 5.4*  ALBUMIN 3.1* 2.4*   No results for input(s): LIPASE, AMYLASE in the last 168 hours. No results for input(s): AMMONIA in the last 168 hours. Coagulation Profile: No results for input(s): INR, PROTIME in the last 168 hours. Cardiac Enzymes:  Recent Labs Lab 03/11/17 1543 03/11/17 2050 03/12/17 0343 03/13/17 0506  TROPONINI 16.91* 29.89* 34.91* 18.72*   BNP  (last 3 results) No results for input(s): PROBNP in the last 8760 hours. HbA1C:  Recent Labs  03/11/17 1543  HGBA1C 6.3*   CBG:  Recent Labs Lab 03/13/17 0752 03/13/17 1145 03/13/17 1651 03/13/17 2123 03/14/17 0725  GLUCAP 112* 152* 109* 107* 107*   Lipid Profile:  Recent Labs  03/12/17 0343  CHOL 103  HDL 45  LDLCALC 35  TRIG 114  CHOLHDL 2.3   Thyroid Function Tests:  Recent Labs  03/12/17 0343  TSH 2.600  FREET4 0.92   Anemia Panel: No results for input(s): VITAMINB12, FOLATE, FERRITIN, TIBC, IRON, RETICCTPCT in the last 72 hours. Sepsis Labs:  Recent Labs Lab 03/11/17 0920 03/11/17 1223 03/11/17 1753 03/13/17 0506  PROCALCITON  --   --  2.27 0.87  LATICACIDVEN 4.0* 2.0* 1.9  --     Recent Results (from the past 240 hour(s))  Culture, blood (routine  x 2)     Status: None (Preliminary result)   Collection Time: 03/11/17  9:20 AM  Result Value Ref Range Status   Specimen Description BLOOD LEFT ARM  Final   Special Requests   Final    BOTTLES DRAWN AEROBIC AND ANAEROBIC Blood Culture results may not be optimal due to an inadequate volume of blood received in culture bottles   Culture NO GROWTH 3 DAYS  Final   Report Status PENDING  Incomplete  Culture, blood (routine x 2)     Status: None (Preliminary result)   Collection Time: 03/11/17 10:22 AM  Result Value Ref Range Status   Specimen Description BLOOD LEFT HAND  Final   Special Requests   Final    BOTTLES DRAWN AEROBIC AND ANAEROBIC Blood Culture results may not be optimal due to an inadequate volume of blood received in culture bottles   Culture NO GROWTH 3 DAYS  Final   Report Status PENDING  Incomplete  Respiratory Panel by PCR     Status: Abnormal   Collection Time: 03/11/17  9:31 PM  Result Value Ref Range Status   Adenovirus NOT DETECTED NOT DETECTED Final   Coronavirus 229E NOT DETECTED NOT DETECTED Final   Coronavirus HKU1 NOT DETECTED NOT DETECTED Final   Coronavirus NL63 NOT  DETECTED NOT DETECTED Final   Coronavirus OC43 NOT DETECTED NOT DETECTED Final   Metapneumovirus NOT DETECTED NOT DETECTED Final   Rhinovirus / Enterovirus DETECTED (A) NOT DETECTED Final   Influenza A NOT DETECTED NOT DETECTED Final   Influenza B NOT DETECTED NOT DETECTED Final   Parainfluenza Virus 1 NOT DETECTED NOT DETECTED Final   Parainfluenza Virus 2 NOT DETECTED NOT DETECTED Final   Parainfluenza Virus 3 NOT DETECTED NOT DETECTED Final   Parainfluenza Virus 4 NOT DETECTED NOT DETECTED Final   Respiratory Syncytial Virus NOT DETECTED NOT DETECTED Final   Bordetella pertussis NOT DETECTED NOT DETECTED Final   Chlamydophila pneumoniae NOT DETECTED NOT DETECTED Final   Mycoplasma pneumoniae NOT DETECTED NOT DETECTED Final  MRSA PCR Screening     Status: None   Collection Time: 03/11/17  9:31 PM  Result Value Ref Range Status   MRSA by PCR NEGATIVE NEGATIVE Final    Comment:        The GeneXpert MRSA Assay (FDA approved for NASAL specimens only), is one component of a comprehensive MRSA colonization surveillance program. It is not intended to diagnose MRSA infection nor to guide or monitor treatment for MRSA infections.          Radiology Studies: US Renal  Result Date: 03/12/2017 CLINICAL DATA:  Acute renal failure superimposed on stage 3 chronic kidney disease. EXAM: RENAL / URINARY TRACT ULTRASOUND COMPLETE COMPARISON:  12/21/2015 FINDINGS: Right Kidney: Length: 11.3 cm. Mildly increased renal parenchymal echogenicity. No mass or hydronephrosis visualized. Left Kidney: Length: 11.0 cm. Mildly increased renal parenchymal echogenicity. No mass or hydronephrosis visualized. Bladder: Appears normal for degree of bladder distention. IMPRESSION: Mildly increased renal parenchymal echogenicity, consistent with medical renal disease. No evidence of hydronephrosis. Electronically Signed   By: Myles Rosenthal M.D.   On: 03/12/2017 13:27   Dg Chest Port 1 View  Result Date:  03/14/2017 CLINICAL DATA:  Hypoxia, history of CHF, diabetes, pneumonia. EXAM: PORTABLE CHEST 1 VIEW COMPARISON:  Chest x-ray of March 12, 2017 FINDINGS: The lungs are well-expanded. The interstitial markings are less prominent. The cardiac silhouette is top-normal in size but stable. The central pulmonary vascularity is less engorged. There  is no pleural effusion. IMPRESSION: Decreased pulmonary interstitial edema and pulmonary vascular congestion is consistent with improving CHF. One cannot exclude superimposed pneumonia but I favor the findings here being predominantly due to CHF. Electronically Signed   By: Ibrahim  [place] M.D.   On: 03/14/2017 08:47        Scheduled Meds: . aspirin EC  81 mg Oral Daily  . [START ON 03/15/2017] azithromycin  500 mg Oral Daily  . chlorhexidine  15 mL Mouth Rinse BID  . citalopram  20 mg Oral Daily  . furosemide  40 mg Intravenous Q12H  . insulin aspart  0-9 Units Subcutaneous TID WC  . ipratropium-albuterol  3 mL Nebulization Q8H  . mouth rinse  15 mL Mouth Rinse q12n4p  . potassium chloride  40 mEq Oral BID  . rosuvastatin  5 mg Oral QHS  . sodium chloride flush  3 mL Intravenous Q12H   Continuous Infusions: . sodium chloride    . azithromycin Stopped (03/13/17 1039)  . cefTRIAXone (ROCEPHIN)  IV 1 g (03/14/17 1000)  . heparin 1,750 Units/hr (03/13/17 1817)  . nitroGLYCERIN 5 mcg/min (03/11/17 2129)     LOS: 3 days    Dron Jaynie Collins, MD [facility] Pager [phone]  If 7PM-7AM, please contact night-coverage [email or web address] Password [email or web address] 03/14/2017, 10:30 AM

## 2017-03-14 NOTE — Progress Notes (Signed)
ANTICOAGULATION CONSULT NOTE -Follow up   Pharmacy Consult for heparin Indication: chest pain/ACS  No Known Allergies  Patient Measurements: Height:  (177.8 cm) Weight: 178 lb 11.2 oz (81.1 kg) IBW/kg (Calculated) : 73 Heparin Dosing Weight: 77.1 kg  Vital Signs: Temp: 98.4 F (36.9 C) (04/30 0727) Temp Source: Oral (04/30 0727) BP: 127/77 (04/30 0727) Pulse Rate: 90 (04/30 0727)  Labs:  Recent Labs  03/11/17 2050  03/12/17 0343 03/13/17 0506 03/13/17 1359 03/14/17 0313  HGB  --   --  9.7* 9.6*  --  8.6*  HCT  --   --  29.4* 28.4*  --  24.8*  PLT  --   --  212 203  --  188  HEPARINUNFRC  --   < > 0.32 0.18* 0.27* 0.36  CREATININE  --   --  2.82* 2.22*  --  2.08*  TROPONINI 29.89*  --  34.91* 18.72*  --   --   < > = values in this interval not displayed.  Estimated Creatinine Clearance: 39.5 mL/min (A) (by C-G formula based on SCr of 2.08 mg/dL (H)).   Medical History: Past Medical History:  Diagnosis Date  . Anemia   . Anxiety   . CKD (chronic kidney disease) stage 3, GFR 30-59 ml/min   . Diastolic heart failure [facility])    Episode December 2017 in the setting of pneumonia  . Essential hypertension   . History of pneumonia   . Hyperlipemia   . Osteomyelitis (HCC)   . Type 2 diabetes mellitus (HCC)     Medications:  See medication history.  Was not on anticoagulation PTA  Assessment: 61 yo M admitted on 03/11/2017 with NSTEMI. For now, medical management including heparin. Troponin starting to trend back down. Now with question of PE.  HL trend down to SUBtherapeutic level at 0.18. No interruptions in gtt or bleeding issues per RN. Hgb has trended down but stable today, plt wnl and stable.   Heparin level is now therapeutic at 0.36 on heparin 1750 units/hr. No issues with infusion or bleeding noted. Venous duplex performed 03/13/17 in the afternoon revealed no evidence of DVT, though positive for minute chronic thrombus in branch of posterior tibial vein  at the ankle.  Goal of Therapy:  Heparin level 0.3-0.7 units/ml Monitor platelets by anticoagulation protocol: Yes   Plan:  Continue heparin 1750 units/hr Daily HL and CBC while on heparin Monitor s/sx of bleeding   Arlean Hopping. Newman Pies, PharmD, BCPS Clinical Pharmacist [phone] 03/14/2017 8:40 AM

## 2017-03-14 NOTE — Progress Notes (Signed)
Progress Note  Patient Name: Douglas Cannon Date of Encounter: 03/14/2017  Primary Cardiologist: Diona Browner  Subjective   No angina. Breathing much better, not dyspneic when bathing (with O2 mask on). Creatinine slowly improving. Troponin peaked at 35 and falling  Inpatient Medications    Scheduled Meds: . aspirin EC  81 mg Oral Daily  . [START ON 03/15/2017] azithromycin  500 mg Oral Daily  . chlorhexidine  15 mL Mouth Rinse BID  . citalopram  20 mg Oral Daily  . furosemide  40 mg Intravenous Q12H  . insulin aspart  0-9 Units Subcutaneous TID WC  . ipratropium-albuterol  3 mL Nebulization Q8H  . mouth rinse  15 mL Mouth Rinse q12n4p  . potassium chloride  40 mEq Oral BID  . rosuvastatin  5 mg Oral QHS  . sodium chloride flush  3 mL Intravenous Q12H   Continuous Infusions: . sodium chloride    . azithromycin Stopped (03/13/17 1039)  . cefTRIAXone (ROCEPHIN)  IV Stopped (03/13/17 0914)  . heparin 1,750 Units/hr (03/13/17 1817)  . nitroGLYCERIN 5 mcg/min (03/11/17 2129)   PRN Meds: sodium chloride, acetaminophen, hydrALAZINE, levalbuterol, ondansetron (ZOFRAN) IV, sodium chloride flush   Vital Signs    Vitals:   03/14/17 0325 03/14/17 0341 03/14/17 0546 03/14/17 0727  BP:  121/81 115/77 127/77  Pulse: 79 87 87 90  Resp: (!) 25  Temp:   98.5 F (36.9 C) 98.4 F (36.9 C)  TempSrc:   Axillary Oral  SpO2: 99% 100% 100% 97%  Weight:   81.1 kg (178 lb 11.2 oz)   Height:        Intake/Output Summary (Last 24 hours) at 03/14/17 0918 Last data filed at 03/14/17 0900  Gross per 24 hour  Intake              363 ml  Output             1475 ml  Net            -1112 ml   Filed Weights   03/12/17 0536 03/13/17 0545 03/14/17 0546  Weight: 85.8 kg (189 lb 1.6 oz) 87 kg (191 lb 12.8 oz) 81.1 kg (178 lb 11.2 oz)    Telemetry    NSR - Personally Reviewed  ECG    NSR, tall R wave in V2 - Personally Reviewed  Physical Exam  Relaxed, smiling GEN: No acute  distress.   Neck: No JVD Cardiac: RRR, no murmurs, rubs, S4 present, mild ankle edema bilaterally.  Respiratory: Clear to auscultation bilaterally. GI: Soft, nontender, non-distended  MS: No edema; No deformity. Neuro:  Nonfocal  Psych: Normal affect   Labs    Chemistry Recent Labs Lab 03/11/17 0920  03/12/17 0343 03/13/17 0506 03/14/17 0313  NA 136  < > 138 139 137  K 4.4  < > 3.8 3.3* 3.2*  CL 102  < > 106 106 104  CO2 17*  --  GLUCOSE 265*  < > 177* 117* 116*  BUN 64*  < > 70* 52* 43*  CREATININE 2.86*  < > 2.82* 2.22* 2.08*  CALCIUM 8.7*  --  8.2* 8.0* 7.7*  PROT 6.7  --  5.4*  --   --   ALBUMIN 3.1*  --  2.4*  --   --   AST 70*  --  78*  --   --   ALT 18  --  16*  --   --  ALKPHOS 50  --  37*  --   --   BILITOT 0.6  --  0.2*  --   --   GFRNONAA 23*  --  23* 31* 33*  GFRAA 26*  --  27* 36* 38*  ANIONGAP 17*  --  < > = values in this interval not displayed.   Hematology Recent Labs Lab 03/12/17 0343 03/13/17 0506 03/14/17 0313  WBC 15.8* 12.5* 10.5  RBC 3.30* 3.17* 2.78*  HGB 9.7* 9.6* 8.6*  HCT 29.4* 28.4* 24.8*  MCV 89.1 89.6 89.2  MCH 29.4 30.3 30.9  MCHC 33.0 33.8 34.7  RDW 14.1 14.1 13.6  PLT 212 203 188    Cardiac Enzymes Recent Labs Lab 03/11/17 1543 03/11/17 2050 03/12/17 0343 03/13/17 0506  TROPONINI 16.91* 29.89* 34.91* 18.72*    Recent Labs Lab 03/11/17 0921  TROPIPOC 5.37*     BNP Recent Labs Lab 03/11/17 0920  BNP 1,836.0*     DDimer  Recent Labs Lab 03/11/17 1753  DDIMER 0.48     Radiology    US Renal  Result Date: 03/12/2017 CLINICAL DATA:  Acute renal failure superimposed on stage 3 chronic kidney disease. EXAM: RENAL / URINARY TRACT ULTRASOUND COMPLETE COMPARISON:  12/21/2015 FINDINGS: Right Kidney: Length: 11.3 cm. Mildly increased renal parenchymal echogenicity. No mass or hydronephrosis visualized. Left Kidney: Length: 11.0 cm. Mildly increased renal parenchymal echogenicity. No mass or  hydronephrosis visualized. Bladder: Appears normal for degree of bladder distention. IMPRESSION: Mildly increased renal parenchymal echogenicity, consistent with medical renal disease. No evidence of hydronephrosis. Electronically Signed   By: Myles Rosenthal M.D.   On: 03/12/2017 13:27   Dg Chest Port 1 View  Result Date: 03/14/2017 CLINICAL DATA:  Hypoxia, history of CHF, diabetes, pneumonia. EXAM: PORTABLE CHEST 1 VIEW COMPARISON:  Chest x-ray of March 12, 2017 FINDINGS: The lungs are well-expanded. The interstitial markings are less prominent. The cardiac silhouette is top-normal in size but stable. The central pulmonary vascularity is less engorged. There is no pleural effusion. IMPRESSION: Decreased pulmonary interstitial edema and pulmonary vascular congestion is consistent with improving CHF. One cannot exclude superimposed pneumonia but I favor the findings here being predominantly due to CHF. Electronically Signed   By: Sekai  [place] M.D.   On: 03/14/2017 08:47   Dg Chest Port 1 View  Result Date: 03/12/2017 CLINICAL DATA:  Heart failure, chest pain EXAM: PORTABLE CHEST 1 VIEW COMPARISON:  03/11/2017 FINDINGS: There is bilateral diffuse interstitial thickening. There is no focal parenchymal opacity. There is no pleural effusion or pneumothorax. The heart and mediastinum are stable. The osseous structures are unremarkable. IMPRESSION: Bilateral diffuse interstitial thickening concerning for mild pulmonary edema. Electronically Signed   By: Elige Ko   On: 03/12/2017 11:11    Cardiac Studies  Echo 03/12/2017 - Left ventricle: Septal apical inferior apical and distal anterior   wall hypokinesis The cavity size was moderately dilated. Wall   thickness was normal. Systolic function was moderately to   severely reduced. The estimated ejection fraction was in the   range of 30% to 35%. Doppler parameters are consistent with both   elevated ventricular end-diastolic filling pressure and elevated    left atrial filling pressure. - Aortic valve: There was mild regurgitation. - Mitral valve: There was mild regurgitation. - Atrial septum: No defect or patent foramen ovale was identified. - Pulmonary arteries: PA peak pressure: 34 mm Hg (S). - Pericardium, extracardiac: Small pericardial effusion no   tamponade.  Patient  Profile     61 y.o. male presenting with acute respiratory hypoxic failure, at least partly due to acute pulmonary edema and labs consistent with acute MI (without angina), on background of DM and stage 3 CKD. Echo shows marked reduction in LVEf and new wall motion abnormalities in distal LAD artery distribution.  Assessment & Plan    1. NSTEMI -ECG suggest posterior MI, echo shows LAD territory abnormalities and EF is lower than expected for degree of enzyme release. Suspect diffuse multivessel CAD with indication for CABG. Will schedule coronary angiography tomorrow. 2. Acute on chronic stage 3 renal failure: baseline creatinine probably 1.4-1.7, but has been >2 as recently as Feb 2017. Improving creatinine since admission. Should be OK for cath tomorrow, but he does have increased risk of nephrotoxicity. 3. CHF: improving left heart failure, can probably switch to O2 by [place] today. Net -4.3 L since admission and marked improvement in edema. 4. DM: most recent A1c excellent at 6.3%. 5. HTN: good control without meds right now. Prefer beta blockers (carvedilol) and avoid RAAS inhibitors, for now at least.  Signed, Thurmon Fair, MD  03/14/2017, 9:18 AM

## 2017-03-14 NOTE — Consult Note (Signed)
WOC Nurse wound consult note Reason for Consult: left foot wound Patient is follow by Dr. Nolen Mu from [place], podiatrist  Wound type: full thickness ulceration, left lateral foot Pressure Injury POA: No Measurement:0.2cm x 0.2cm x 0.2 approx, not able to remove collagen dressing today Drainage (amount, consistency, odor) scant Periwound:intact  Dressing procedure/placement/frequency: Continue silver collagen, use patient's supply from home, not carried in the hospital. Top with foam and conform or kerlix.  Change every other day.  Due T/Th/Sat this week.  Follow up with Dr. Nolen Mu as scheduled.  Discussed POC with patient and bedside nurse.  Re consult if needed, will not follow at this time. Thanks  Amyr Sluder [employer], RN,CWOCN, CNS [phone])

## 2017-03-14 NOTE — Progress Notes (Signed)
[facility] Attending Note  Presenting HPI:  61 y.o. male transferred to our facility on 4/27 with shortness of breath. Dyspnea started on 4/25 with progressive worsening. Occurred with traveling for work approximately 4 hours for the past several days. Patient was noted to be hypoxic into the 50s on room air at his primary care physician's office. He was subsequently brought to the emergency department for further evaluation. Imaging findings concerning for volume overload with possible underlying pneumonia. Started initially on noninvasive positive pressure ventilation as well as heparin and nitroglycerin infusions. Patient was transferred from outside hospital to [facility] for further evaluation and management.  Subjective:  No events overnight, on and off BiPAP for WOB  Temp:  [97.6 F (36.4 C)-99.5 F (37.5 C)] 97.6 F (36.4 C) (04/30 1110) Pulse Rate:  [79-101] 88 (04/30 1110) Resp:  [14-25] 14 (04/30 1110) BP: (109-136)/(60-88) 125/87 (04/30 1110) SpO2:  [92 %-100 %] 97 % (04/30 1110) FiO2 (%):  [60 %-100 %] 60 % (04/30 0546) Weight:  [81.1 kg (178 lb 11.2 oz)] 81.1 kg (178 lb 11.2 oz) (04/30 0546)   General:  Awake. No distress. Alert. Integument:  Warm & dry. No rash or bruising on exposed skin. HEENT:  No scleral icterus or injection. Pupils symmetric.  Pulmonary:  Mild bilateral basilar crackles. Normal work of breathing on NRB  Cardiovascular:  Regular rate . No JVD apprecaited.  Abdomen:  Soft. Nontender. Normal bowel sounds. Neurological:  Cranial nerves grossly in tact. No meningismus. Moving all 4 extremities equally. Oriented x4.   CBC Latest Ref Rng & Units 03/14/2017 03/13/2017 03/12/2017  WBC 4.0 - 10.5 K/uL 10.5 12.5(H) 15.8(H)  Hemoglobin 13.0 - 17.0 g/dL 1.6(X) 0.9(U) 0.4(V)  Hematocrit 39.0 - 52.0 % 24.8(L) 28.4(L) 29.4(L)  Platelets 150 - 400 K/uL 188 203 212   BMP Latest Ref Rng & Units 03/14/2017 03/13/2017 03/12/2017  Glucose 65 - 99 mg/dL  409(W) 119(J) 478(G)  BUN 6 - 20 mg/dL 95(A) 21(H) 08(M)  Creatinine 0.61 - 1.24 mg/dL 5.78(I) 6.96(E) 9.52(W)  Sodium 135 - 145 mmol/L 137 139 138  Potassium 3.5 - 5.1 mmol/L 3.2(L) 3.3(L) 3.8  Chloride 101 - 111 mmol/L 104 106 106  CO2 22 - 32 mmol/L Calcium 8.9 - 10.3 mg/dL 7.7(L) 8.0(L) 8.2(L)    IMAGING/STUDIES: PORT CXR 4/28:  Personally reviewed by me. Patchy bilateral alveolar opacities worse on the right compared with the left with significant improvement in confluence compared with x-ray imaging from yesterday. No obvious pleural effusion but there is a small amount of fluid within the right fissure. No obvious consolidation. TTE 4/28 >>>  MICROBIOLOGY: MRSA PCR 4/27:  Negative Blood Cultures x2 4/27 >>> HIV 4/27:  Nonreactive  Respiratory Viral Panel PCR 4/27 >>> Urine Streptococcal Antigen 4/28:  Negative  Urine Legionella Antigen 4/28 >>>  ANTIBIOTICS: Azithromycin 4/27 >>> Rocephin 4/27 >>>  ASSESSMENT/PLAN:  61 y.o. male presenting with acute hypoxic respiratory failure. Certainly concerning for possible pulmonary embolism given his recent travels. Alternatively, this could also represent pulmonary edema from congestive heart failure. The patient's Procalcitonin is elevated raising the additional possibility of an infectious process. Patient does have recent mold exposure.  1. Acute hypoxic respiratory failure: Continuing noninvasive positive pressure ventilation as needed. Recommend continuing diuresis as renal function and blood pressure allow.  1. Change BiPAP to PRN 2. Titrate O2 for sat of 88-92% 3. Address pulmonary edema  2. Possible pulmonary embolism: Echocardiogram (EF 30-35%) and venous  duplex (Right clean, left with chronic thrombus on posterior tibial).  1. Heparin defer to cards. 2. At least SQ heparin for DVT prophylaxis.  3. Possible severe community acquired pneumonia:  1. Infectious workup pending with respiratory viral panel, cultures,  and urine Legionella antigen.  2. Continuing to trend Procalcitonin per algorithm.  3. Rocephin and zithromax #6, recommend d/c zithromax and continue rocephin for a total of 8 days.  4. Possible decompensated congestive heart failure:   - Diureses as BP and renal function allows (currently lasix 40 mg IV q12).  Will need to arrange for home O2.  If fails to compensate after diureses then will need to consider GOC discussion.  Doubt this is a PE specially with chronic DVT present.    Discussed with PCCM-resident.  PCCM will sign off, please call back if needed.  Alyson Reedy, M.D. [facility] Medicine. Pager: [phone]. After hours pager: [phone].  12:10 PM 03/14/17

## 2017-03-14 NOTE — Progress Notes (Addendum)
Fall risk bundle in place. Call light in reach and bed in lowest position. Pt has bi-pap on. VS WNL.  Will continue to monitor and perform hourly rounding.

## 2017-03-15 ENCOUNTER — Encounter (HOSPITAL_COMMUNITY)
Admission: EM | Disposition: A | Discharge status: Home / Self Care | Payer: Self-pay | Source: Home / Self Care | Attending: Internal Medicine

## 2017-03-15 DIAGNOSIS — I255 Ischemic cardiomyopathy: Secondary | ICD-10-CM

## 2017-03-15 DIAGNOSIS — I2510 Atherosclerotic heart disease of native coronary artery without angina pectoris: Secondary | ICD-10-CM

## 2017-03-15 HISTORY — PX: LEFT HEART CATH AND CORONARY ANGIOGRAPHY: CATH118249

## 2017-03-15 LAB — CBC
HCT: 28 % — ABNORMAL LOW (ref 39.0–52.0)
Hemoglobin: 9.3 g/dL — ABNORMAL LOW (ref 13.0–17.0)
MCH: 29.6 pg (ref 26.0–34.0)
MCHC: 33.2 g/dL (ref 30.0–36.0)
MCV: 89.2 fL (ref 78.0–100.0)
Platelets: 251 10*3/uL (ref 150–400)
RBC: 3.14 MIL/uL — ABNORMAL LOW (ref 4.22–5.81)
RDW: 13.4 % (ref 11.5–15.5)
WBC: 12.3 10*3/uL — ABNORMAL HIGH (ref 4.0–10.5)

## 2017-03-15 LAB — GLUCOSE, CAPILLARY
Glucose-Capillary: 147 mg/dL — ABNORMAL HIGH (ref 65–99)
Glucose-Capillary: 131 mg/dL — ABNORMAL HIGH (ref 65–99)
Glucose-Capillary: 109 mg/dL — ABNORMAL HIGH (ref 65–99)
Glucose-Capillary: 168 mg/dL — ABNORMAL HIGH (ref 65–99)

## 2017-03-15 LAB — BASIC METABOLIC PANEL
Anion gap: 7 (ref 5–15)
BUN: 37 mg/dL — ABNORMAL HIGH (ref 6–20)
CO2: 26 mmol/L (ref 22–32)
Calcium: 8.1 mg/dL — ABNORMAL LOW (ref 8.9–10.3)
Chloride: 106 mmol/L (ref 101–111)
Creatinine, Ser: 1.9 mg/dL — ABNORMAL HIGH (ref 0.61–1.24)
GFR calc Af Amer: 43 mL/min — ABNORMAL LOW (ref >60)
GFR calc non Af Amer: 37 mL/min — ABNORMAL LOW (ref >60)
Glucose, Bld: 149 mg/dL — ABNORMAL HIGH (ref 65–99)
Potassium: 4.1 mmol/L (ref 3.5–5.1)
Sodium: 139 mmol/L (ref 135–145)

## 2017-03-15 LAB — PROCALCITONIN: Procalcitonin: 0.27 ng/mL

## 2017-03-15 LAB — HEPARIN LEVEL (UNFRACTIONATED): Heparin Unfractionated: 0.26 IU/mL — ABNORMAL LOW (ref 0.30–0.70)

## 2017-03-15 LAB — PROTIME-INR
INR: 1.15
Prothrombin Time: 14.8 seconds (ref 11.4–15.2)

## 2017-03-15 SURGERY — LEFT HEART CATH AND CORONARY ANGIOGRAPHY
Anesthesia: LOCAL

## 2017-03-15 MED ORDER — IOPAMIDOL (ISOVUE-370) INJECTION 76%
INTRAVENOUS | Status: AC
Start: 2017-03-15 — End: 2017-03-15
  Filled 2017-03-15: qty 100

## 2017-03-15 MED ORDER — SODIUM CHLORIDE 0.9 % IV SOLN: 250.0000 mL | INTRAVENOUS | Status: DC | PRN

## 2017-03-15 MED ORDER — MIDAZOLAM HCL 2 MG/2ML IJ SOLN
INTRAMUSCULAR | Status: AC
  Filled 2017-03-15: qty 2

## 2017-03-15 MED ORDER — VERAPAMIL HCL 2.5 MG/ML IV SOLN
INTRAVENOUS | Status: AC
  Filled 2017-03-15: qty 2

## 2017-03-15 MED ORDER — SODIUM CHLORIDE 0.9% FLUSH: 3.0000 mL | INTRAVENOUS | Status: DC | PRN

## 2017-03-15 MED ORDER — DEXTROSE 5 % IV SOLN
500.0000 mg | Freq: Once | INTRAVENOUS | Status: DC
  Filled 2017-03-15: qty 500

## 2017-03-15 MED ORDER — FENTANYL CITRATE (PF) 100 MCG/2ML IJ SOLN
INTRAMUSCULAR | Status: AC
  Filled 2017-03-15: qty 2

## 2017-03-15 MED ORDER — LIDOCAINE HCL 1 % IJ SOLN
INTRAMUSCULAR | Status: AC
  Filled 2017-03-15: qty 20

## 2017-03-15 MED ORDER — SODIUM CHLORIDE 0.9% FLUSH
3.0000 mL | Freq: Two times a day (BID) | INTRAVENOUS | Status: DC
  Administered 2017-03-16 – 2017-03-21 (×6): 3 mL via INTRAVENOUS

## 2017-03-15 MED ORDER — HEPARIN (PORCINE) IN NACL 2-0.9 UNIT/ML-% IJ SOLN
INTRAMUSCULAR | Status: DC | PRN
  Administered 2017-03-15: 1000 mL

## 2017-03-15 MED ORDER — FENTANYL CITRATE (PF) 100 MCG/2ML IJ SOLN
INTRAMUSCULAR | Status: DC | PRN
  Administered 2017-03-15: 50 ug via INTRAVENOUS

## 2017-03-15 MED ORDER — LIDOCAINE HCL (PF) 1 % IJ SOLN
INTRAMUSCULAR | Status: DC | PRN
  Administered 2017-03-15: 2 mL

## 2017-03-15 MED ORDER — HEPARIN SODIUM (PORCINE) 1000 UNIT/ML IJ SOLN
INTRAMUSCULAR | Status: DC | PRN
  Administered 2017-03-15: 4000 [IU] via INTRAVENOUS

## 2017-03-15 MED ORDER — HEPARIN (PORCINE) IN NACL 100-0.45 UNIT/ML-% IJ SOLN
1850.0000 [IU]/h | INTRAMUSCULAR | Status: DC
  Administered 2017-03-16: 1850 [IU]/h via INTRAVENOUS
  Filled 2017-03-15: qty 250

## 2017-03-15 MED ORDER — AZITHROMYCIN 250 MG PO TABS: 500.0000 mg | ORAL_TABLET | Freq: Every day | ORAL | Status: DC

## 2017-03-15 MED ORDER — POTASSIUM CHLORIDE CRYS ER 20 MEQ PO TBCR
40.0000 meq | EXTENDED_RELEASE_TABLET | Freq: Every day | ORAL | Status: DC
  Administered 2017-03-16 – 2017-03-20 (×5): 40 meq via ORAL
  Filled 2017-03-15 (×6): qty 2

## 2017-03-15 MED ORDER — MIDAZOLAM HCL 2 MG/2ML IJ SOLN
INTRAMUSCULAR | Status: DC | PRN
  Administered 2017-03-15: 2 mg via INTRAVENOUS

## 2017-03-15 MED ORDER — HEPARIN (PORCINE) IN NACL 2-0.9 UNIT/ML-% IJ SOLN
INTRAMUSCULAR | Status: DC | PRN
  Administered 2017-03-15: 10 mL via INTRA_ARTERIAL

## 2017-03-15 MED ORDER — IOPAMIDOL (ISOVUE-370) INJECTION 76%
INTRAVENOUS | Status: DC | PRN
  Administered 2017-03-15: 75 mL via INTRAVENOUS

## 2017-03-15 MED ORDER — HEPARIN SODIUM (PORCINE) 1000 UNIT/ML IJ SOLN
INTRAMUSCULAR | Status: AC
  Filled 2017-03-15: qty 1

## 2017-03-15 MED ORDER — ACETAMINOPHEN 325 MG PO TABS: 650.0000 mg | ORAL_TABLET | ORAL | Status: DC | PRN

## 2017-03-15 MED ORDER — ASPIRIN 81 MG PO CHEW
81.0000 mg | CHEWABLE_TABLET | Freq: Every day | ORAL | Status: DC
  Administered 2017-03-16 – 2017-03-21 (×6): 81 mg via ORAL
  Filled 2017-03-15 (×6): qty 1

## 2017-03-15 MED ORDER — SODIUM CHLORIDE 0.9 % IV SOLN: INTRAVENOUS | Status: AC

## 2017-03-15 MED ORDER — DEXTROSE 5 % IV SOLN
1.0000 g | INTRAVENOUS | Status: AC
  Administered 2017-03-15 – 2017-03-18 (×4): 1 g via INTRAVENOUS
  Filled 2017-03-15 (×4): qty 10

## 2017-03-15 MED ORDER — ONDANSETRON HCL 4 MG/2ML IJ SOLN: 4.0000 mg | Freq: Four times a day (QID) | INTRAMUSCULAR | Status: DC | PRN

## 2017-03-15 MED ORDER — HEPARIN (PORCINE) IN NACL 2-0.9 UNIT/ML-% IJ SOLN
INTRAMUSCULAR | Status: AC
  Filled 2017-03-15: qty 1000

## 2017-03-15 MED ORDER — CARVEDILOL 3.125 MG PO TABS
3.1250 mg | ORAL_TABLET | Freq: Two times a day (BID) | ORAL | Status: DC
  Administered 2017-03-16 – 2017-03-21 (×11): 3.125 mg via ORAL
  Filled 2017-03-15 (×12): qty 1

## 2017-03-15 MED ORDER — DIAZEPAM 5 MG PO TABS
5.0000 mg | ORAL_TABLET | Freq: Four times a day (QID) | ORAL | Status: DC | PRN
  Administered 2017-03-15 – 2017-03-20 (×5): 5 mg via ORAL
  Filled 2017-03-15 (×5): qty 1

## 2017-03-15 SURGICAL SUPPLY — 10 items
CATH INFINITI 5FR ANG PIGTAIL (CATHETERS) ×2 IMPLANT
CATH OPTITORQUE TIG 4.0 5F (CATHETERS) ×2 IMPLANT
DEVICE RAD COMP TR BAND LRG (VASCULAR PRODUCTS) ×2 IMPLANT
GLIDESHEATH SLEND SS 6F .021 (SHEATH) ×2 IMPLANT
GUIDEWIRE INQWIRE 1.5J.035X260 (WIRE) ×1 IMPLANT
INQWIRE 1.5J .035X260CM (WIRE) ×2
KIT HEART LEFT (KITS) ×2 IMPLANT
PACK CARDIAC CATHETERIZATION (CUSTOM PROCEDURE TRAY) ×2 IMPLANT
TRANSDUCER W/STOPCOCK (MISCELLANEOUS) ×2 IMPLANT
TUBING CIL FLEX 10 FLL-RA (TUBING) ×2 IMPLANT

## 2017-03-15 NOTE — Interval H&P Note (Signed)
Cath Lab Visit (complete for each Cath Lab visit)  Clinical Evaluation Leading to the Procedure:   ACS: Yes.    Non-ACS:    Anginal Classification: CCS IV  Anti-ischemic medical therapy: Minimal Therapy (1 class of medications)  Non-Invasive Test Results: No non-invasive testing performed  Prior CABG: No previous CABG      History and Physical Interval Note:  03/15/2017 3:58 PM  Douglas Cannon  has presented today for surgery, with the diagnosis of cp  The various methods of treatment have been discussed with the patient and family. After consideration of risks, benefits and other options for treatment, the patient has consented to  Procedure(s): Left Heart Cath and Coronary Angiography (N/A) as a surgical intervention .  The patient's history has been reviewed, patient examined, no change in status, stable for surgery.  I have reviewed the patient's chart and labs.  Questions were answered to the patient's satisfaction.     Nicki Gu[place]

## 2017-03-15 NOTE — H&P (View-Only) (Signed)
Progress Note  Patient Name: Douglas Cannon Date of Encounter: 03/15/2017  Primary Cardiologist: Diona Browner  Subjective   Feeling much better, can now lie flat. In /out not recorded accurately. Weight down 12 lb from peak during this admission. Creatinine better at 1.9.  Inpatient Medications    Scheduled Meds: . aspirin EC  81 mg Oral Daily  . chlorhexidine  15 mL Mouth Rinse BID  . citalopram  20 mg Oral Daily  . furosemide  40 mg Intravenous Q12H  . insulin aspart  0-9 Units Subcutaneous TID WC  . ipratropium-albuterol  3 mL Nebulization Q8H  . mouth rinse  15 mL Mouth Rinse q12n4p  . potassium chloride  40 mEq Oral BID  . rosuvastatin  5 mg Oral QHS  . sodium chloride flush  3 mL Intravenous Q12H  . sodium chloride flush  3 mL Intravenous Q12H   Continuous Infusions: . sodium chloride    . sodium chloride    . sodium chloride 10 mL/hr at 03/15/17 0533  . cefTRIAXone (ROCEPHIN)  IV    . heparin 1,850 Units/hr (03/15/17 0542)  . nitroGLYCERIN 5 mcg/min (03/11/17 2129)   PRN Meds: sodium chloride, sodium chloride, acetaminophen, hydrALAZINE, levalbuterol, ondansetron (ZOFRAN) IV, sodium chloride flush, sodium chloride flush   Vital Signs    Vitals:   03/15/17 0306 03/15/17 0400 03/15/17 0632 03/15/17 0754  BP:  121/80  132/86  Pulse: 91 93 86 85  Resp: 18 (!) Temp:  97.5 F (36.4 C)  98 F (36.7 C)  TempSrc:  Oral  Oral  SpO2: 95% 94% 97% 95%  Weight:  81.3 kg (179 lb 3.7 oz)    Height:        Intake/Output Summary (Last 24 hours) at 03/15/17 0905 Last data filed at 03/15/17 0059  Gross per 24 hour  Intake           434.04 ml  Output              700 ml  Net          -265.96 ml   Filed Weights   03/13/17 0545 03/14/17 0546 03/15/17 0400  Weight: 87 kg (191 lb 12.8 oz) 81.1 kg (178 lb 11.2 oz) 81.3 kg (179 lb 3.7 oz)    Telemetry    NSR - Personally Reviewed  ECG    NSR, tall R wave in V2 - Personally Reviewed  Physical Exam    Smiling, comfortable, still wearing O2 face mask GEN: No acute distress.   Neck: No JVD Cardiac: RRR, no murmurs, rubs, S4 gallop present. 1+ edema B ankles Respiratory: Clear to auscultation bilaterally. GI: Soft, nontender, non-distended  MS: No edema; No deformity. Neuro:  Nonfocal  Psych: Normal affect   Labs    Chemistry Recent Labs Lab 03/11/17 0920  03/12/17 0343 03/13/17 0506 03/14/17 0313 03/15/17 0420  NA 136  < > 138 139 137 139  K 4.4  < > 3.8 3.3* 3.2* 4.1  CL 102  < > 106 106 104 106  CO2 17*  --  GLUCOSE 265*  < > 177* 117* 116* 149*  BUN 64*  < > 70* 52* 43* 37*  CREATININE 2.86*  < > 2.82* 2.22* 2.08* 1.90*  CALCIUM 8.7*  --  8.2* 8.0* 7.7* 8.1*  PROT 6.7  --  5.4*  --   --   --   ALBUMIN 3.1*  --  2.4*  --   --   --  AST 70*  --  78*  --   --   --   ALT 18  --  16*  --   --   --   ALKPHOS 50  --  37*  --   --   --   BILITOT 0.6  --  0.2*  --   --   --   GFRNONAA 23*  --  23* 31* 33* 37*  GFRAA 26*  --  27* 36* 38* 43*  ANIONGAP 17*  --  < > = values in this interval not displayed.   Hematology Recent Labs Lab 03/13/17 0506 03/14/17 0313 03/15/17 0420  WBC 12.5* 10.5 12.3*  RBC 3.17* 2.78* 3.14*  HGB 9.6* 8.6* 9.3*  HCT 28.4* 24.8* 28.0*  MCV 89.6 89.2 89.2  MCH 30.3 30.9 29.6  MCHC 33.8 34.7 33.2  RDW 14.1 13.6 13.4  PLT 203 188 251    Cardiac Enzymes Recent Labs Lab 03/11/17 1543 03/11/17 2050 03/12/17 0343 03/13/17 0506  TROPONINI 16.91* 29.89* 34.91* 18.72*    Recent Labs Lab 03/11/17 0921  TROPIPOC 5.37*     BNP Recent Labs Lab 03/11/17 0920  BNP 1,836.0*     DDimer  Recent Labs Lab 03/11/17 1753  DDIMER 0.48     Radiology    Dg Chest Port 1 View  Result Date: 03/14/2017 CLINICAL DATA:  Hypoxia, history of CHF, diabetes, pneumonia. EXAM: PORTABLE CHEST 1 VIEW COMPARISON:  Chest x-ray of March 12, 2017 FINDINGS: The lungs are well-expanded. The interstitial markings are less  prominent. The cardiac silhouette is top-normal in size but stable. The central pulmonary vascularity is less engorged. There is no pleural effusion. IMPRESSION: Decreased pulmonary interstitial edema and pulmonary vascular congestion is consistent with improving CHF. One cannot exclude superimposed pneumonia but I favor the findings here being predominantly due to CHF. Electronically Signed   By: Bleu  [place] M.D.   On: 03/14/2017 08:47    Cardiac Studies   Echo 03/12/2017 - Left ventricle: Septal apical inferior apical and distal anterior wall hypokinesis The cavity size was moderately dilated. Wall thickness was normal. Systolic function was moderately to severely reduced. The estimated ejection fraction was in the range of 30% to 35%. Doppler parameters are consistent with both elevated ventricular end-diastolic filling pressure and elevated left atrial filling pressure. - Aortic valve: There was mild regurgitation. - Mitral valve: There was mild regurgitation. - Atrial septum: No defect or patent foramen ovale was identified. - Pulmonary arteries: PA peak pressure: 34 mm Hg (S). - Pericardium, extracardiac: Small pericardial effusion no tamponade.   Patient Profile     61 y.o. male presenting with acute respiratory hypoxic failure, at least partly due to acute pulmonary edema, labs consistent with acute MI (without angina), moderately reduced LVEF 30-35% with regional abnormalities, on background of DM and stage 3 CKD.  Assessment & Plan    1. NSTEMI -ECG suggest posterior MI, echo shows LAD territory abnormalities and EF is lower than expected for degree of enzyme release. Suspect diffuse multivessel CAD with indication for CABG. Cardia cath today. This procedure has been fully reviewed with the patient and written informed consent has been obtained. 2. Acute on chronic stage 3 renal failure: baseline creatinine probably 1.4-1.7, but has been >2 as recently as Feb  2017. Improving creatinine since admission, lowest so far today. He does have increased risk of nephrotoxicity. May need staged intervention if PCI is felt to be indicated 3. CHF: improving  left heart failure, but still on O2 by FM today. Net -4.3 L (accurate?) and 12 lb lighter since admission, with marked improvement in edema. 4. DM: most recent A1c excellent at 6.3%. 5. HTN: good control without meds right now. Prefer beta blockers (carvedilol) and avoid RAAS inhibitors, at least until after cath and reevaluation of renal parameters.  Signed, Thurmon Fair, MD  03/15/2017, 9:05 AM

## 2017-03-15 NOTE — Progress Notes (Signed)
ANTICOAGULATION CONSULT NOTE   Pharmacy Consult for heparin Indication: chest pain/ACS  No Known Allergies  Patient Measurements: Height:  (177.8 cm) Weight: 178 lb 11.2 oz (81.1 kg) IBW/kg (Calculated) : 73 Heparin Dosing Weight: 77.1 kg  Vital Signs: Temp: 98.4 F (36.9 C) (05/01 0000) Temp Source: Oral (05/01 0000) BP: 110/79 (05/01 0000) Pulse Rate: 91 (05/01 0306)  Labs:  Recent Labs  03/13/17 0506 03/13/17 1359 03/14/17 0313 03/15/17 0420  HGB 9.6*  --  8.6* 9.3*  HCT 28.4*  --  24.8* 28.0*  PLT 203  --  188 251  LABPROT  --   --   --  14.8  INR  --   --   --  1.15  HEPARINUNFRC 0.18* 0.27* 0.36 0.26*  CREATININE 2.22*  --  2.08*  --   TROPONINI 18.72*  --   --   --     Estimated Creatinine Clearance: 39.5 mL/min (A) (by C-G formula based on SCr of 2.08 mg/dL (H)).  Assessment: 61 y.o. male with NSTEMI for heparin  Goal of Therapy:  Heparin level 0.3-0.7 units/ml Monitor platelets by anticoagulation protocol: Yes   Plan:  Increase Heparin 1850 units/hr F/U after cath today  Geannie Risen, PharmD, BCPS  03/15/2017 5:08 AM

## 2017-03-15 NOTE — Plan of Care (Signed)
Problem: Activity: Goal: Capacity to carry out activities will improve Outcome: Progressing Patient tolerates fair, but will have shortness of breath with excertion.

## 2017-03-15 NOTE — Progress Notes (Signed)
ANTICOAGULATION CONSULT NOTE - Follow Up Consult  Pharmacy Consult for Heparin Indication: chest pain/ACS/NSTEMI - multivessel CAD, ICM  No Known Allergies  Patient Measurements: Height:  (177.8 cm) Weight: 179 lb 3.7 oz (81.3 kg) IBW/kg (Calculated) : 73 Heparin Dosing Weight: 81 kg  Vital Signs: Temp: 98.1 F (36.7 C) (05/01 1118) Temp Source: Oral (05/01 1118) BP: 131/84 (05/01 1641) Pulse Rate: 0 (05/01 1646)  Labs:  Recent Labs  03/13/17 0506 03/13/17 1359 03/14/17 0313 03/15/17 0420  HGB 9.6*  --  8.6* 9.3*  HCT 28.4*  --  24.8* 28.0*  PLT 203  --  188 251  LABPROT  --   --   --  14.8  INR  --   --   --  1.15  HEPARINUNFRC 0.18* 0.27* 0.36 0.26*  CREATININE 2.22*  --  2.08* 1.90*  TROPONINI 18.72*  --   --   --     Estimated Creatinine Clearance: 43.2 mL/min (A) (by C-G formula based on SCr of 1.9 mg/dL (H)).  Assessment:  61 yr old on heparin drip for ACS/NSTEMI.  s/p cardiac cath - multivessel CAD, EF 30-35%.  For TCTS consult.    Heparin to resume 8 hrs after sheath out. Sheath out at 16:37. No bleeding or hematoma.  RN reports no bleeding with TR band deflation, expected to be off by ~8pm.     Last heparin level was 0.26 this morning on 1750 units/hr -> drip increased to 1850 units/hr, then off for cath.  Goal of Therapy:  Heparin level 0.3-0.7 units/ml Monitor platelets by anticoagulation protocol: Yes   Plan:   Resume heparin drip at 1850 units/hr ~1am on 03/16/17.  Heparin level ~6 hrs after drip resumed.  Daily heparin level and CBC while on heparin.  Follow up plans.  Dennie Fetters, RPh Pager: [phone] 03/15/2017,5:49 PM

## 2017-03-15 NOTE — Progress Notes (Signed)
PROGRESS NOTE    Douglas Cannon  WUJ:[id number] DOB: 04-14-1956 DOA: 03/11/2017 PCP: Dwana Melena, MD   Brief Narrative: 61 y.o. male with medical history significant of HTN, HLD, DM Type 2, BPH, CKD Stage 3, Anemia, Anxiety, and ?Diastolic HF with other comorbids who presented to the [facility] ED with worsening shortness of breath and leg swelling for about 2 days. On admission patient was found to have elevated troponin, BNP concerning for CHF and possible pneumonia. Patient was started on heparin drip, IV antibiotics, nitro drip and transferred to [facility] for further evaluation. Evaluated by cardiologist and pulmonary critical care team.  Assessment & Plan:  #  NSTEMI: Peak troponin level 34.9. Patient with shortness of breath and leg swelling on admission. He does not have chest pain. - Currently on IV heparin and IV nitroglycerin. Plan for  cardiac cath today as per cardiologist. Echo with low EF and wall motion abnormalities.  Continue to monitor.  -Continue aspirin, statin. -Cardiology consult appreciated. -Significant clinical improvement.  #Acute systolic congestive heart failure. Patient with elevated BNP and lower extremity swelling. Echo showed EF of 30-35% LE swelling improved. Currently on IV Lasix as per cardiology. Continue to monitor BMP, weight and electrolytes.  #Acute hypoxic respiratory failure due to possible pneumonia, CHF and may presumed PE: Evaluated by pulmonologist.  -Hypoxia is slowly improving, currently on facemask. Used BiPAP last night. Try to wean oxygen to nasal cannula as tolerated. -doppler LE showed: Left  - Positive for a minute chronic thrombus in a branch of the posterior tibial vein ant the ankle.  Continue to treat for pneumonia. Off Azithromycin and now on ceftriaxone for a total 8 days.  Respiratory therapy referred. -Pulmonary consult appreciated -on IV heparin now.   #Community acquired pneumonia: Continue ceftriaxone now. Finished  azithromycin. Respiratory viral studies positive for rhinovirus.  #Possible pulmonary embolism: Exactly unknown. Unable to obtain CT angiogram because of renal failure. For now continue IV heparin. Whether if patient needs chronic anticoagulation defer to cardiology and pulmonologist.  #Acute on chronic kidney disease stage III: Patient has baseline serum creatinine level around 1.5-2. UA unremarkable. Ultrasound of kidneys with medical renal disease. Serum creatinine level trending down tp 1.9  today. Currently on IV Lasix. Continue to monitor BMP. Avoid nephrotoxins.   #Hypokalemia in the setting of diuretics: Improved. Change from twice a day to daily dose.   #Hyperlipidemia: Continue statin  #Type 2 diabetes: Continue sliding scale. Monitor blood sugar level. Patient's oral intake is not great because of being on BiPAP.  DVT prophylaxis: IV heparin Code Status: Full code Family Communication: Discussed with the patient's wife, son and daughter at bedside Disposition Plan: Likely discharge home in 2-3 days depending on clinical improvement    Consultants:   Cardiologist  Pulmonary critical care  Procedures: Noninvasive ventilation Antimicrobials: Azithromycin and ceftriaxone since 4/27  Subjective: Patient was seen and examined at bedside. Significant clinical improvement. Denied shortness of breath, chest pain, nausea, vomiting, abdominal pain. Plan for cardiac cath today.  Objective: Vitals:   03/15/17 0306 03/15/17 0400 03/15/17 0632 03/15/17 0754  BP:  121/80  132/86  Pulse: 91 93 86 85  Resp: 18 (!) Temp:  97.5 F (36.4 C)  98 F (36.7 C)  TempSrc:  Oral  Oral  SpO2: 95% 94% 97% 95%  Weight:  81.3 kg (179 lb 3.7 oz)    Height:        Intake/Output Summary (Last 24 hours) at 03/15/17  1106 Last data filed at 03/15/17 1000  Gross per 24 hour  Intake           246.46 ml  Output             1300 ml  Net         -1053.54 ml   Filed Weights   03/13/17  0545 03/14/17 0546 03/15/17 0400  Weight: 87 kg (191 lb 12.8 oz) 81.1 kg (178 lb 11.2 oz) 81.3 kg (179 lb 3.7 oz)    Examination:  General exam: Lying in bed comfortable, has face mask for oxygenation.  Respiratory system: Clear bilateral, respiratory effort normal, no wheezing  Cardiovascular system: Regular rate and rhythm, S1 and S2 normal. No lower extremity edema. Gastrointestinal system: Abdomen is nondistended, soft and nontender. Normal bowel sounds heard. Central nervous system: Alert awake and following commands Extremities: Symmetric 5 x 5 power. Skin: No rashes, lesions or ulcers Psychiatry: Judgement and insight appear normal. Mood & affect appropriate.     Data Reviewed: I have personally reviewed following labs and imaging studies  CBC:  Recent Labs Lab 03/11/17 0920 03/11/17 0922 03/12/17 0343 03/13/17 0506 03/14/17 0313 03/15/17 0420  WBC 27.3*  --  15.8* 12.5* 10.5 12.3*  NEUTROABS 24.9*  --  13.3*  --   --   --   HGB 12.4* 11.9* 9.7* 9.6* 8.6* 9.3*  HCT 36.6* 35.0* 29.4* 28.4* 24.8* 28.0*  MCV 90.4  --  89.1 89.6 89.2 89.2  PLT 289  --  212 203 188 251   Basic Metabolic Panel:  Recent Labs Lab 03/11/17 0920 03/11/17 0922 03/12/17 0343 03/13/17 0506 03/14/17 0313 03/15/17 0420  NA 136 136 138 139 137 139  K 4.4 4.3 3.8 3.3* 3.2* 4.1  CL 102 105 106 106 104 106  CO2 17*  --  GLUCOSE 265* 259* 177* 117* 116* 149*  BUN 64* 57* 70* 52* 43* 37*  CREATININE 2.86* 2.90* 2.82* 2.22* 2.08* 1.90*  CALCIUM 8.7*  --  8.2* 8.0* 7.7* 8.1*  MG  --   --  2.0  --  1.8  --   PHOS  --   --  4.3  --   --   --    GFR: Estimated Creatinine Clearance: 43.2 mL/min (A) (by C-G formula based on SCr of 1.9 mg/dL (H)). Liver Function Tests:  Recent Labs Lab 03/11/17 0920 03/12/17 0343  AST 70* 78*  ALT 18 16*  ALKPHOS 50 37*  BILITOT 0.6 0.2*  PROT 6.7 5.4*  ALBUMIN 3.1* 2.4*   No results for input(s): LIPASE, AMYLASE in the last 168  hours. No results for input(s): AMMONIA in the last 168 hours. Coagulation Profile:  Recent Labs Lab 03/15/17 0420  INR 1.15   Cardiac Enzymes:  Recent Labs Lab 03/11/17 1543 03/11/17 2050 03/12/17 0343 03/13/17 0506  TROPONINI 16.91* 29.89* 34.91* 18.72*   BNP (last 3 results) No results for input(s): PROBNP in the last 8760 hours. HbA1C: No results for input(s): HGBA1C in the last 72 hours. CBG:  Recent Labs Lab 03/14/17 0725 03/14/17 1108 03/14/17 1632 03/14/17 2050 03/15/17 0723  GLUCAP 107* 144* 146* 190* 147*   Lipid Profile: No results for input(s): CHOL, HDL, LDLCALC, TRIG, CHOLHDL, LDLDIRECT in the last 72 hours. Thyroid Function Tests: No results for input(s): TSH, T4TOTAL, FREET4, T3FREE, THYROIDAB in the last 72 hours. Anemia Panel: No results for input(s): VITAMINB12, FOLATE, FERRITIN, TIBC, IRON, RETICCTPCT in the last 72  hours. Sepsis Labs:  Recent Labs Lab 03/11/17 0920 03/11/17 1223 03/11/17 1753 03/13/17 0506 03/15/17 0420  PROCALCITON  --   --  2.27 0.87 0.27  LATICACIDVEN 4.0* 2.0* 1.9  --   --     Recent Results (from the past 240 hour(s))  Culture, blood (routine x 2)     Status: None (Preliminary result)   Collection Time: 03/11/17  9:20 AM  Result Value Ref Range Status   Specimen Description BLOOD LEFT ARM  Final   Special Requests   Final    BOTTLES DRAWN AEROBIC AND ANAEROBIC Blood Culture results may not be optimal due to an inadequate volume of blood received in culture bottles   Culture NO GROWTH 4 DAYS  Final   Report Status PENDING  Incomplete  Culture, blood (routine x 2)     Status: None (Preliminary result)   Collection Time: 03/11/17 10:22 AM  Result Value Ref Range Status   Specimen Description BLOOD LEFT HAND  Final   Special Requests   Final    BOTTLES DRAWN AEROBIC AND ANAEROBIC Blood Culture results may not be optimal due to an inadequate volume of blood received in culture bottles   Culture NO GROWTH 4 DAYS   Final   Report Status PENDING  Incomplete  Respiratory Panel by PCR     Status: Abnormal   Collection Time: 03/11/17  9:31 PM  Result Value Ref Range Status   Adenovirus NOT DETECTED NOT DETECTED Final   Coronavirus 229E NOT DETECTED NOT DETECTED Final   Coronavirus HKU1 NOT DETECTED NOT DETECTED Final   Coronavirus NL63 NOT DETECTED NOT DETECTED Final   Coronavirus OC43 NOT DETECTED NOT DETECTED Final   Metapneumovirus NOT DETECTED NOT DETECTED Final   Rhinovirus / Enterovirus DETECTED (A) NOT DETECTED Final   Influenza A NOT DETECTED NOT DETECTED Final   Influenza B NOT DETECTED NOT DETECTED Final   Parainfluenza Virus 1 NOT DETECTED NOT DETECTED Final   Parainfluenza Virus 2 NOT DETECTED NOT DETECTED Final   Parainfluenza Virus 3 NOT DETECTED NOT DETECTED Final   Parainfluenza Virus 4 NOT DETECTED NOT DETECTED Final   Respiratory Syncytial Virus NOT DETECTED NOT DETECTED Final   Bordetella pertussis NOT DETECTED NOT DETECTED Final   Chlamydophila pneumoniae NOT DETECTED NOT DETECTED Final   Mycoplasma pneumoniae NOT DETECTED NOT DETECTED Final  MRSA PCR Screening     Status: None   Collection Time: 03/11/17  9:31 PM  Result Value Ref Range Status   MRSA by PCR NEGATIVE NEGATIVE Final    Comment:        The GeneXpert MRSA Assay (FDA approved for NASAL specimens only), is one component of a comprehensive MRSA colonization surveillance program. It is not intended to diagnose MRSA infection nor to guide or monitor treatment for MRSA infections.          Radiology Studies: Dg Chest Port 1 View  Result Date: 03/14/2017 CLINICAL DATA:  Hypoxia, history of CHF, diabetes, pneumonia. EXAM: PORTABLE CHEST 1 VIEW COMPARISON:  Chest x-ray of March 12, 2017 FINDINGS: The lungs are well-expanded. The interstitial markings are less prominent. The cardiac silhouette is top-normal in size but stable. The central pulmonary vascularity is less engorged. There is no pleural effusion.  IMPRESSION: Decreased pulmonary interstitial edema and pulmonary vascular congestion is consistent with improving CHF. One cannot exclude superimposed pneumonia but I favor the findings here being predominantly due to CHF. Electronically Signed   By: Lottie  [place] M.D.   On:  03/14/2017 08:47        Scheduled Meds: . aspirin EC  81 mg Oral Daily  . chlorhexidine  15 mL Mouth Rinse BID  . citalopram  20 mg Oral Daily  . furosemide  40 mg Intravenous Q12H  . insulin aspart  0-9 Units Subcutaneous TID WC  . ipratropium-albuterol  3 mL Nebulization Q8H  . mouth rinse  15 mL Mouth Rinse q12n4p  . potassium chloride  40 mEq Oral BID  . rosuvastatin  5 mg Oral QHS  . sodium chloride flush  3 mL Intravenous Q12H  . sodium chloride flush  3 mL Intravenous Q12H   Continuous Infusions: . sodium chloride    . sodium chloride    . sodium chloride 10 mL/hr at 03/15/17 1000  . cefTRIAXone (ROCEPHIN)  IV 1 g (03/15/17 1005)  . heparin 1,850 Units/hr (03/15/17 1000)  . nitroGLYCERIN 5 mcg/min (03/15/17 1000)     LOS: 4 days    Yaqueline Gutter Jaynie Collins, MD [facility] Pager [phone]  If 7PM-7AM, please contact night-coverage [email or web address] Password [email or web address] 03/15/2017, 11:06 AM

## 2017-03-15 NOTE — Progress Notes (Signed)
 Progress Note  Patient Name: Silverio Jaquez Date of Encounter: 03/15/2017  Primary Cardiologist: McDowell  Subjective   Feeling much better, can now lie flat. In /out not recorded accurately. Weight down 12 lb from peak during this admission. Creatinine better at 1.9.  Inpatient Medications    Scheduled Meds: . aspirin EC  81 mg Oral Daily  . chlorhexidine  15 mL Mouth Rinse BID  . citalopram  20 mg Oral Daily  . furosemide  40 mg Intravenous Q12H  . insulin aspart  0-9 Units Subcutaneous TID WC  . ipratropium-albuterol  3 mL Nebulization Q8H  . mouth rinse  15 mL Mouth Rinse q12n4p  . potassium chloride  40 mEq Oral BID  . rosuvastatin  5 mg Oral QHS  . sodium chloride flush  3 mL Intravenous Q12H  . sodium chloride flush  3 mL Intravenous Q12H   Continuous Infusions: . sodium chloride    . sodium chloride    . sodium chloride 10 mL/hr at 03/15/17 0533  . cefTRIAXone (ROCEPHIN)  IV    . heparin 1,850 Units/hr (03/15/17 0542)  . nitroGLYCERIN 5 mcg/min (03/11/17 2129)   PRN Meds: sodium chloride, sodium chloride, acetaminophen, hydrALAZINE, levalbuterol, ondansetron (ZOFRAN) IV, sodium chloride flush, sodium chloride flush   Vital Signs    Vitals:   03/15/17 0306 03/15/17 0400 03/15/17 0632 03/15/17 0754  BP:  121/80  132/86  Pulse: 91 93 86 85  Resp: 18 (!) 22 17 17  Temp:  97.5 F (36.4 C)  98 F (36.7 C)  TempSrc:  Oral  Oral  SpO2: 95% 94% 97% 95%  Weight:  81.3 kg (179 lb 3.7 oz)    Height:        Intake/Output Summary (Last 24 hours) at 03/15/17 0905 Last data filed at 03/15/17 0059  Gross per 24 hour  Intake           434.04 ml  Output              700 ml  Net          -265.96 ml   Filed Weights   03/13/17 0545 03/14/17 0546 03/15/17 0400  Weight: 87 kg (191 lb 12.8 oz) 81.1 kg (178 lb 11.2 oz) 81.3 kg (179 lb 3.7 oz)    Telemetry    NSR - Personally Reviewed  ECG    NSR, tall R wave in V2 - Personally Reviewed  Physical Exam    Smiling, comfortable, still wearing O2 face mask GEN: No acute distress.   Neck: No JVD Cardiac: RRR, no murmurs, rubs, S4 gallop present. 1+ edema B ankles Respiratory: Clear to auscultation bilaterally. GI: Soft, nontender, non-distended  MS: No edema; No deformity. Neuro:  Nonfocal  Psych: Normal affect   Labs    Chemistry Recent Labs Lab 03/11/17 0920  03/12/17 0343 03/13/17 0506 03/14/17 0313 03/15/17 0420  NA 136  < > 138 139 137 139  K 4.4  < > 3.8 3.3* 3.2* 4.1  CL 102  < > 106 106 104 106  CO2 17*  --  22 24 24 26  GLUCOSE 265*  < > 177* 117* 116* 149*  BUN 64*  < > 70* 52* 43* 37*  CREATININE 2.86*  < > 2.82* 2.22* 2.08* 1.90*  CALCIUM 8.7*  --  8.2* 8.0* 7.7* 8.1*  PROT 6.7  --  5.4*  --   --   --   ALBUMIN 3.1*  --  2.4*  --   --   --     AST 70*  --  78*  --   --   --   ALT 18  --  16*  --   --   --   ALKPHOS 50  --  37*  --   --   --   BILITOT 0.6  --  0.2*  --   --   --   GFRNONAA 23*  --  23* 31* 33* 37*  GFRAA 26*  --  27* 36* 38* 43*  ANIONGAP 17*  --  10 9 9 7  < > = values in this interval not displayed.   Hematology Recent Labs Lab 03/13/17 0506 03/14/17 0313 03/15/17 0420  WBC 12.5* 10.5 12.3*  RBC 3.17* 2.78* 3.14*  HGB 9.6* 8.6* 9.3*  HCT 28.4* 24.8* 28.0*  MCV 89.6 89.2 89.2  MCH 30.3 30.9 29.6  MCHC 33.8 34.7 33.2  RDW 14.1 13.6 13.4  PLT 203 188 251    Cardiac Enzymes Recent Labs Lab 03/11/17 1543 03/11/17 2050 03/12/17 0343 03/13/17 0506  TROPONINI 16.91* 29.89* 34.91* 18.72*    Recent Labs Lab 03/11/17 0921  TROPIPOC 5.37*     BNP Recent Labs Lab 03/11/17 0920  BNP 1,836.0*     DDimer  Recent Labs Lab 03/11/17 1753  DDIMER 0.48     Radiology    Dg Chest Port 1 View  Result Date: 03/14/2017 CLINICAL DATA:  Hypoxia, history of CHF, diabetes, pneumonia. EXAM: PORTABLE CHEST 1 VIEW COMPARISON:  Chest x-ray of March 12, 2017 FINDINGS: The lungs are well-expanded. The interstitial markings are less  prominent. The cardiac silhouette is top-normal in size but stable. The central pulmonary vascularity is less engorged. There is no pleural effusion. IMPRESSION: Decreased pulmonary interstitial edema and pulmonary vascular congestion is consistent with improving CHF. One cannot exclude superimposed pneumonia but I favor the findings here being predominantly due to CHF. Electronically Signed   By: Jantz  Jordan M.D.   On: 03/14/2017 08:47    Cardiac Studies   Echo 03/12/2017 - Left ventricle: Septal apical inferior apical and distal anterior wall hypokinesis The cavity size was moderately dilated. Wall thickness was normal. Systolic function was moderately to severely reduced. The estimated ejection fraction was in the range of 30% to 35%. Doppler parameters are consistent with both elevated ventricular end-diastolic filling pressure and elevated left atrial filling pressure. - Aortic valve: There was mild regurgitation. - Mitral valve: There was mild regurgitation. - Atrial septum: No defect or patent foramen ovale was identified. - Pulmonary arteries: PA peak pressure: 34 mm Hg (S). - Pericardium, extracardiac: Small pericardial effusion no tamponade.   Patient Profile     61 y.o. male presenting with acute respiratory hypoxic failure, at least partly due to acute pulmonary edema, labs consistent with acute MI (without angina), moderately reduced LVEF 30-35% with regional abnormalities, on background of DM and stage 3 CKD.  Assessment & Plan    1. NSTEMI -ECG suggest posterior MI, echo shows LAD territory abnormalities and EF is lower than expected for degree of enzyme release. Suspect diffuse multivessel CAD with indication for CABG. Cardia cath today. This procedure has been fully reviewed with the patient and written informed consent has been obtained. 2. Acute on chronic stage 3 renal failure: baseline creatinine probably 1.4-1.7, but has been >2 as recently as Feb  2017. Improving creatinine since admission, lowest so far today. He does have increased risk of nephrotoxicity. May need staged intervention if PCI is felt to be indicated 3. CHF: improving   left heart failure, but still on O2 by FM today. Net -4.3 L (accurate?) and 12 lb lighter since admission, with marked improvement in edema. 4. DM: most recent A1c excellent at 6.3%. 5. HTN: good control without meds right now. Prefer beta blockers (carvedilol) and avoid RAAS inhibitors, at least until after cath and reevaluation of renal parameters.  Signed, Suheily Birks, MD  03/15/2017, 9:05 AM    

## 2017-03-16 ENCOUNTER — Encounter (HOSPITAL_COMMUNITY): Payer: Self-pay | Admitting: Cardiovascular Disease

## 2017-03-16 ENCOUNTER — Ambulatory Visit (HOSPITAL_COMMUNITY): Payer: BLUE CROSS/BLUE SHIELD

## 2017-03-16 LAB — CBC
HCT: 29.9 % — ABNORMAL LOW (ref 39.0–52.0)
Hemoglobin: 9.8 g/dL — ABNORMAL LOW (ref 13.0–17.0)
MCH: 29.3 pg (ref 26.0–34.0)
MCHC: 32.8 g/dL (ref 30.0–36.0)
MCV: 89.3 fL (ref 78.0–100.0)
Platelets: 295 10*3/uL (ref 150–400)
RBC: 3.35 MIL/uL — ABNORMAL LOW (ref 4.22–5.81)
RDW: 13 % (ref 11.5–15.5)
WBC: 14.1 10*3/uL — ABNORMAL HIGH (ref 4.0–10.5)

## 2017-03-16 LAB — CULTURE, BLOOD (ROUTINE X 2)
Culture: NO GROWTH
Culture: NO GROWTH

## 2017-03-16 LAB — BASIC METABOLIC PANEL
Anion gap: 12 (ref 5–15)
BUN: 33 mg/dL — ABNORMAL HIGH (ref 6–20)
CO2: 26 mmol/L (ref 22–32)
Calcium: 8.4 mg/dL — ABNORMAL LOW (ref 8.9–10.3)
Chloride: 100 mmol/L — ABNORMAL LOW (ref 101–111)
Creatinine, Ser: 1.78 mg/dL — ABNORMAL HIGH (ref 0.61–1.24)
GFR calc Af Amer: 46 mL/min — ABNORMAL LOW (ref >60)
GFR calc non Af Amer: 40 mL/min — ABNORMAL LOW (ref >60)
Glucose, Bld: 143 mg/dL — ABNORMAL HIGH (ref 65–99)
Potassium: 3.7 mmol/L (ref 3.5–5.1)
Sodium: 138 mmol/L (ref 135–145)

## 2017-03-16 LAB — GLUCOSE, CAPILLARY
Glucose-Capillary: 145 mg/dL — ABNORMAL HIGH (ref 65–99)
Glucose-Capillary: 226 mg/dL — ABNORMAL HIGH (ref 65–99)
Glucose-Capillary: 104 mg/dL — ABNORMAL HIGH (ref 65–99)
Glucose-Capillary: 226 mg/dL — ABNORMAL HIGH (ref 65–99)

## 2017-03-16 LAB — HEPARIN LEVEL (UNFRACTIONATED): Heparin Unfractionated: 0.33 IU/mL (ref 0.30–0.70)

## 2017-03-16 MED ORDER — ENOXAPARIN SODIUM 30 MG/0.3ML ~~LOC~~ SOLN
30.0000 mg | SUBCUTANEOUS | Status: DC
  Administered 2017-03-16: 30 mg via SUBCUTANEOUS
  Filled 2017-03-16: qty 0.3

## 2017-03-16 MED ORDER — ENOXAPARIN SODIUM 40 MG/0.4ML ~~LOC~~ SOLN
40.0000 mg | SUBCUTANEOUS | Status: DC
  Filled 2017-03-16: qty 0.4

## 2017-03-16 NOTE — Progress Notes (Signed)
PROGRESS NOTE    Douglas Cannon  WUJ:[id number] DOB: 1955-11-18 DOA: 03/11/2017 PCP: Dwana Melena, MD   Brief Narrative: 61 y.o. male with medical history significant of HTN, HLD, DM Type 2, BPH, CKD Stage 3, Anemia, Anxiety, and ?Diastolic HF with other comorbids who presented to the [facility] ED with worsening shortness of breath and leg swelling for about 2 days. On admission patient was found to have elevated troponin, BNP concerning for CHF and possible pneumonia. Patient was started on heparin drip, IV antibiotics, nitro drip and transferred to [facility] for further evaluation. Evaluated by cardiologist and pulmonary critical care team.  Assessment & Plan:  #Acute hypoxic respiratory failure due to acute CHF/pulm edema and viral illness-rhinovirus -Hypoxia improving with diuresis -OFF BIPAP -continue IV lasix-see below -repeat CXR more consistent with CHF -Respiratory viral studies positive for rhinovirus. -also on Abx for pneumonia, off Azithromycin and now on ceftriaxone to complete 7day course  #  NSTEMI: Peak troponin level 34.9. - Cards following, stop IV heparin-d/w cards - on minimal dose of IV nitro-will stop this too, no chest pain - s/p Left  Heart cath 5/1 with multivessel CAD, CVTS consult for CABG -still volume overloaded, IV lasix as below -Continue aspirin, statin. -start BB in next 1-2days  #Acute systolic congestive heart failure.  -ischemic cardiomyopathy -Echo showed EF of 30-35%  -continue iV lasix, dose increased  #Chronic DVT -chronic deep vein thrombosis involving a minute branch of the left distal posterior tibial vein of   the left lower extremity, does not need any RX for this, called and d/w with [facility] Dr.Fields  #Acute on chronic kidney disease stage III: -baseline serum creatinine level around 1.5-2. - Ultrasound of kidneys with medical renal disease.  - Serum creatinine fluctuating with diuresis, now 1.7, monitor -Avoid nephrotoxins,  monitor Bmet  #Hypokalemia in the setting of diuretics: Improved. Change from twice a day to daily dose.   #Hyperlipidemia: Continue statin  #Type 2 diabetes: - Continue sliding scale.  - Hba1c 6.3  DVT prophylaxis: Lovenox Code Status: Full code Family Communication: None at bedside Disposition Plan: Home after CABG likely   Consultants:   Cardiologist  Pulmonary critical care  Procedures: Noninvasive ventilation Antimicrobials: Azithromycin and ceftriaxone since 4/27  Subjective: Breathing better  Objective: Vitals:   03/15/17 2303 03/16/17 0037 03/16/17 0443 03/16/17 0731  BP:  111/62 119/80 (!) 150/86  Pulse:  79 87 91  Resp:  16 (!) 21 (!) 22  Temp:   98.5 F (36.9 C) 97.5 F (36.4 C)  TempSrc:   Oral Oral  SpO2: 93% 94% (!) 85% 97%  Weight:   79.9 kg (176 lb 3.2 oz)   Height:        Intake/Output Summary (Last 24 hours) at 03/16/17 1049 Last data filed at 03/16/17 0841  Gross per 24 hour  Intake          1318.43 ml  Output             2350 ml  Net         -1031.57 ml   Filed Weights   03/14/17 0546 03/15/17 0400 03/16/17 0443  Weight: 81.1 kg (178 lb 11.2 oz) 81.3 kg (179 lb 3.7 oz) 79.9 kg (176 lb 3.2 oz)    Examination:  General exam: AAOx3, no distress Respiratory system: decreased BS at bases Cardiovascular system: Regular rate and rhythm, S1 and S2 normal Gastrointestinal system: Abdomen is nondistended, soft and nontender. Normal bowel sounds heard. Central  nervous system: Alert awake and following commands Extremities: no edema Skin: appropriate.   Data Reviewed: I have personally reviewed following labs and imaging studies  CBC:  Recent Labs Lab 03/11/17 0920  03/12/17 0343 03/13/17 0506 03/14/17 0313 03/15/17 0420 03/16/17 0746  WBC 27.3*  --  15.8* 12.5* 10.5 12.3* 14.1*  NEUTROABS 24.9*  --  13.3*  --   --   --   --   HGB 12.4*  < > 9.7* 9.6* 8.6* 9.3* 9.8*  HCT 36.6*  < > 29.4* 28.4* 24.8* 28.0* 29.9*  MCV 90.4  --   89.1 89.6 89.2 89.2 89.3  PLT 289  --  212 203 188 251 295  < > = values in this interval not displayed. Basic Metabolic Panel:  Recent Labs Lab 03/12/17 0343 03/13/17 0506 03/14/17 0313 03/15/17 0420 03/16/17 0746  NA 138 139 137 139 138  K 3.8 3.3* 3.2* 4.1 3.7  CL 106 106 104 106 100*  CO2 GLUCOSE 177* 117* 116* 149* 143*  BUN 70* 52* 43* 37* 33*  CREATININE 2.82* 2.22* 2.08* 1.90* 1.78*  CALCIUM 8.2* 8.0* 7.7* 8.1* 8.4*  MG 2.0  --  1.8  --   --   PHOS 4.3  --   --   --   --    GFR: Estimated Creatinine Clearance: 46.1 mL/min (A) (by C-G formula based on SCr of 1.78 mg/dL (H)). Liver Function Tests:  Recent Labs Lab 03/11/17 0920 03/12/17 0343  AST 70* 78*  ALT 18 16*  ALKPHOS 50 37*  BILITOT 0.6 0.2*  PROT 6.7 5.4*  ALBUMIN 3.1* 2.4*   No results for input(s): LIPASE, AMYLASE in the last 168 hours. No results for input(s): AMMONIA in the last 168 hours. Coagulation Profile:  Recent Labs Lab 03/15/17 0420  INR 1.15   Cardiac Enzymes:  Recent Labs Lab 03/11/17 1543 03/11/17 2050 03/12/17 0343 03/13/17 0506  TROPONINI 16.91* 29.89* 34.91* 18.72*   BNP (last 3 results) No results for input(s): PROBNP in the last 8760 hours. HbA1C: No results for input(s): HGBA1C in the last 72 hours. CBG:  Recent Labs Lab 03/15/17 0723 03/15/17 1117 03/15/17 1702 03/15/17 2111 03/16/17 0730  GLUCAP 147* 131* 109* 168* 145*   Lipid Profile: No results for input(s): CHOL, HDL, LDLCALC, TRIG, CHOLHDL, LDLDIRECT in the last 72 hours. Thyroid Function Tests: No results for input(s): TSH, T4TOTAL, FREET4, T3FREE, THYROIDAB in the last 72 hours. Anemia Panel: No results for input(s): VITAMINB12, FOLATE, FERRITIN, TIBC, IRON, RETICCTPCT in the last 72 hours. Sepsis Labs:  Recent Labs Lab 03/11/17 0920 03/11/17 1223 03/11/17 1753 03/13/17 0506 03/15/17 0420  PROCALCITON  --   --  2.27 0.87 0.27  LATICACIDVEN 4.0* 2.0* 1.9  --   --      Recent Results (from the past 240 hour(s))  Culture, blood (routine x 2)     Status: None   Collection Time: 03/11/17  9:20 AM  Result Value Ref Range Status   Specimen Description BLOOD LEFT ARM  Final   Special Requests   Final    BOTTLES DRAWN AEROBIC AND ANAEROBIC Blood Culture results may not be optimal due to an inadequate volume of blood received in culture bottles   Culture NO GROWTH 5 DAYS  Final   Report Status 03/16/2017 FINAL  Final  Culture, blood (routine x 2)     Status: None   Collection Time: 03/11/17 10:22 AM  Result Value Ref Range  Status   Specimen Description BLOOD LEFT HAND  Final   Special Requests   Final    BOTTLES DRAWN AEROBIC AND ANAEROBIC Blood Culture results may not be optimal due to an inadequate volume of blood received in culture bottles   Culture NO GROWTH 5 DAYS  Final   Report Status 03/16/2017 FINAL  Final  Respiratory Panel by PCR     Status: Abnormal   Collection Time: 03/11/17  9:31 PM  Result Value Ref Range Status   Adenovirus NOT DETECTED NOT DETECTED Final   Coronavirus 229E NOT DETECTED NOT DETECTED Final   Coronavirus HKU1 NOT DETECTED NOT DETECTED Final   Coronavirus NL63 NOT DETECTED NOT DETECTED Final   Coronavirus OC43 NOT DETECTED NOT DETECTED Final   Metapneumovirus NOT DETECTED NOT DETECTED Final   Rhinovirus / Enterovirus DETECTED (A) NOT DETECTED Final   Influenza A NOT DETECTED NOT DETECTED Final   Influenza B NOT DETECTED NOT DETECTED Final   Parainfluenza Virus 1 NOT DETECTED NOT DETECTED Final   Parainfluenza Virus 2 NOT DETECTED NOT DETECTED Final   Parainfluenza Virus 3 NOT DETECTED NOT DETECTED Final   Parainfluenza Virus 4 NOT DETECTED NOT DETECTED Final   Respiratory Syncytial Virus NOT DETECTED NOT DETECTED Final   Bordetella pertussis NOT DETECTED NOT DETECTED Final   Chlamydophila pneumoniae NOT DETECTED NOT DETECTED Final   Mycoplasma pneumoniae NOT DETECTED NOT DETECTED Final  MRSA PCR Screening      Status: None   Collection Time: 03/11/17  9:31 PM  Result Value Ref Range Status   MRSA by PCR NEGATIVE NEGATIVE Final    Comment:        The GeneXpert MRSA Assay (FDA approved for NASAL specimens only), is one component of a comprehensive MRSA colonization surveillance program. It is not intended to diagnose MRSA infection nor to guide or monitor treatment for MRSA infections.          Radiology Studies: No results found.      Scheduled Meds: . aspirin  81 mg Oral Daily  . carvedilol  3.125 mg Oral BID WC  . chlorhexidine  15 mL Mouth Rinse BID  . citalopram  20 mg Oral Daily  . furosemide  40 mg Intravenous Q12H  . insulin aspart  0-9 Units Subcutaneous TID WC  . ipratropium-albuterol  3 mL Nebulization Q8H  . mouth rinse  15 mL Mouth Rinse q12n4p  . potassium chloride  40 mEq Oral Daily  . rosuvastatin  5 mg Oral QHS  . sodium chloride flush  3 mL Intravenous Q12H  . sodium chloride flush  3 mL Intravenous Q12H   Continuous Infusions: . sodium chloride    . sodium chloride    . cefTRIAXone (ROCEPHIN)  IV 1 g (03/16/17 1049)  . heparin 1,850 Units/hr (03/16/17 0100)  . nitroGLYCERIN 5 mcg/min (03/16/17 0104)     LOS: 5 days    Zannie Cove, MD [facility] Pager [phone] If 7PM-7AM, please contact night-coverage [email or web address] Password [facility] 03/16/2017, 10:49 AM

## 2017-03-16 NOTE — Progress Notes (Signed)
ANTICOAGULATION CONSULT NOTE - Follow Up Consult  Pharmacy Consult for Heparin Indication: chest pain/ACS/NSTEMI - multivessel CAD, ICM  No Known Allergies  Patient Measurements: Height:  (177.8 cm) Weight: 176 lb 3.2 oz (79.9 kg) IBW/kg (Calculated) : 73 Heparin Dosing Weight: 81 kg  Vital Signs: Temp: 97.5 F (36.4 C) (05/02 0731) Temp Source: Oral (05/02 0731) BP: 150/86 (05/02 0731) Pulse Rate: 91 (05/02 0731)  Labs:  Recent Labs  03/14/17 0313 03/15/17 0420 03/16/17 0746  HGB 8.6* 9.3* 9.8*  HCT 24.8* 28.0* 29.9*  PLT 188 251 295  LABPROT  --  14.8  --   INR  --  1.15  --   HEPARINUNFRC 0.36 0.26* 0.33  CREATININE 2.08* 1.90* 1.78*    Estimated Creatinine Clearance: 46.1 mL/min (A) (by C-G formula based on SCr of 1.78 mg/dL (H)).  Assessment: 61 yo M admitted on 03/11/2017 with NSTEMI. For now, medical management including heparin. S/p cardiac cath - multivessel CAD, EF 30-35%.  For TCTS consult.  Heparin level is now therapeutic at 0.33 on heparin 1850 units/hr. No issues with infusion or bleeding noted.  Goal of Therapy:  Heparin level 0.3-0.7 units/ml Monitor platelets by anticoagulation protocol: Yes   Plan:  Continue heparin 1850 units/hr 6h confirmatory HL Daily CBC/HL  Arlean Hopping. Newman Pies, PharmD, BCPS Clinical Pharmacist [phone] 03/16/2017,9:32 AM

## 2017-03-16 NOTE — Progress Notes (Signed)
Progress Note  Patient Name: Douglas Cannon Date of Encounter: 03/16/2017  Primary Cardiologist: Diona Browner  Subjective   Feeling well without chest pain or shortness of breath, was able to lie flat to sleep last night. He is now on oxygen by nasal cannula as of this morning. He was up in chair for bathing and hd no lightheadedness. He expressed disappointment at needed bypass surgery but we had a discussion and he is hopeful.    Inpatient Medications    Scheduled Meds: . aspirin  81 mg Oral Daily  . carvedilol  3.125 mg Oral BID WC  . chlorhexidine  15 mL Mouth Rinse BID  . citalopram  20 mg Oral Daily  . furosemide  40 mg Intravenous Q12H  . insulin aspart  0-9 Units Subcutaneous TID WC  . ipratropium-albuterol  3 mL Nebulization Q8H  . mouth rinse  15 mL Mouth Rinse q12n4p  . potassium chloride  40 mEq Oral Daily  . rosuvastatin  5 mg Oral QHS  . sodium chloride flush  3 mL Intravenous Q12H  . sodium chloride flush  3 mL Intravenous Q12H   Continuous Infusions: . sodium chloride    . sodium chloride    . cefTRIAXone (ROCEPHIN)  IV 1 g (03/15/17 1005)  . heparin 1,850 Units/hr (03/16/17 0100)  . nitroGLYCERIN 5 mcg/min (03/16/17 0104)   PRN Meds: sodium chloride, sodium chloride, acetaminophen, diazepam, hydrALAZINE, levalbuterol, ondansetron (ZOFRAN) IV, sodium chloride flush, sodium chloride flush   Vital Signs    Vitals:   03/15/17 2303 03/16/17 0037 03/16/17 0443 03/16/17 0731  BP:  111/62 119/80 (!) 150/86  Pulse:  79 87 91  Resp:  16 (!) 21 (!) 22  Temp:   98.5 F (36.9 C) 97.5 F (36.4 C)  TempSrc:   Oral Oral  SpO2: 93% 94% (!) 85% 97%  Weight:   176 lb 3.2 oz (79.9 kg)   Height:        Intake/Output Summary (Last 24 hours) at 03/16/17 1007 Last data filed at 03/16/17 0841  Gross per 24 hour  Intake          1318.43 ml  Output             2350 ml  Net         -1031.57 ml   Filed Weights   03/14/17 0546 03/15/17 0400 03/16/17 0443  Weight: 178 lb  11.2 oz (81.1 kg) 179 lb 3.7 oz (81.3 kg) 176 lb 3.2 oz (79.9 kg)    Telemetry    NSR 80's-90's - Personally Reviewed  ECG    No new tracing  Physical Exam   Smiling, comfortable, right wrist without hematoma or tenderness GEN: No acute distress.   Neck: No JVD Cardiac: RRR, no murmurs, rubs, S4 gallop, no edema Respiratory: Clear to auscultation bilaterally. GI: Soft, nontender, non-distended  MS: No edema; No deformity. Neuro:  Nonfocal  Psych: Normal affect   Labs    Chemistry Recent Labs Lab 03/11/17 0920  03/12/17 0343  03/14/17 0313 03/15/17 0420 03/16/17 0746  NA 136  < > 138  < > 137 139 138  K 4.4  < > 3.8  < > 3.2* 4.1 3.7  CL 102  < > 106  < > 104 106 100*  CO2 17*  --  22  < > GLUCOSE 265*  < > 177*  < > 116* 149* 143*  BUN 64*  < > 70*  < >  43* 37* 33*  CREATININE 2.86*  < > 2.82*  < > 2.08* 1.90* 1.78*  CALCIUM 8.7*  --  8.2*  < > 7.7* 8.1* 8.4*  PROT 6.7  --  5.4*  --   --   --   --   ALBUMIN 3.1*  --  2.4*  --   --   --   --   AST 70*  --  78*  --   --   --   --   ALT 18  --  16*  --   --   --   --   ALKPHOS 50  --  37*  --   --   --   --   BILITOT 0.6  --  0.2*  --   --   --   --   GFRNONAA 23*  --  23*  < > 33* 37* 40*  GFRAA 26*  --  27*  < > 38* 43* 46*  ANIONGAP 17*  --  10  < > < > = values in this interval not displayed.   Hematology  Recent Labs Lab 03/14/17 0313 03/15/17 0420 03/16/17 0746  WBC 10.5 12.3* 14.1*  RBC 2.78* 3.14* 3.35*  HGB 8.6* 9.3* 9.8*  HCT 24.8* 28.0* 29.9*  MCV 89.2 89.2 89.3  MCH 30.9 29.6 29.3  MCHC 34.7 33.2 32.8  RDW 13.6 13.4 13.0  PLT 188 251 295    Cardiac Enzymes  Recent Labs Lab 03/11/17 1543 03/11/17 2050 03/12/17 0343 03/13/17 0506  TROPONINI 16.91* 29.89* 34.91* 18.72*     Recent Labs Lab 03/11/17 0921  TROPIPOC 5.37*     BNP  Recent Labs Lab 03/11/17 0920  BNP 1,836.0*     DDimer   Recent Labs Lab 03/11/17 1753  DDIMER 0.48     Radiology     No results found.  Cardiac Studies   Echo 03/12/2017 - Left ventricle: Septal apical inferior apical and distal anterior wall hypokinesis The cavity size was moderately dilated. Wall thickness was normal. Systolic function was moderately to severely reduced. The estimated ejection fraction was in the range of 30% to 35%. Doppler parameters are consistent with both elevated ventricular end-diastolic filling pressure and elevated left atrial filling pressure. - Aortic valve: There was mild regurgitation. - Mitral valve: There was mild regurgitation. - Atrial septum: No defect or patent foramen ovale was identified. - Pulmonary arteries: PA peak pressure: 34 mm Hg (S). - Pericardium, extracardiac: Small pericardial effusion no tamponade. ___________________________________________________________________________________  Cardiac catheterization 03/15/2017 Conclusion    LM lesion, 90 %stenosed.  Ost LAD to Prox LAD lesion, 95 %stenosed.  Ost 1st Diag to 1st Diag lesion, 90 %stenosed.  Ost 3rd Mrg to 3rd Mrg lesion, 95 %stenosed.  Mid Cx to Dist Cx lesion, 90 %stenosed.  RPDA lesion, 85 %stenosed.  Mid RCA lesion, 20 %stenosed.  Acute Mrg lesion, 95 %stenosed.   Findings are compatible with a significant ischemic cardiomyopathy in this patient with echo Doppler documentation of an EF of 30-35%.  Severe multivessel CAD with coronary calcification and diffuse 90-95% near ostial to mid LAD stenoses with diffuse 90% stenosis in the first diagonal vessel; 95 and 90% diffuse stenoses in segmental obtuse marginal branches of the left circumflex coronary artery with good distal targets, and diffuse 80% to 90% PDA stenosis with 20% mid RCA stenosis and 95% proximal stenosis and a small proximal branch of the RCA.  LVEDP 34 mm Hg.  RECOMMENDATION:  Surgical consultation will be obtained for CABG revascularization surgery.    Diagnostic Diagram        Patient  Profile     61 y.o. male presenting with acute respiratory hypoxic failure, at least partly due to acute pulmonary edema, labs consistent with acute MI (without angina), moderately reduced LVEF 30-35% with regional abnormalities, on background of DM and stage 3 CKD.  Assessment & Plan    1. NSTEMI -ECG suggest posterior MI, echo shows LAD territory abnormalities and EF is lower than expected for degree of enzyme release. Cardiac catheterization yesterday showed severe multivessel CAD, see report above. Surgical consultation will be obtained for revascularization surgery  2. Acute on chronic stage 3 renal failure: baseline creatinine probably 1.4-1.7, but has been >2 as recently as Feb 2017. Improving creatinine since admission, lowest so far today, 1.78. He does have increased risk of nephrotoxicity.   3. CHF: improving left heart failure, decreasing oxygen demand, now on nasal cannula. I&O net -5.8 L (accurate?) and Weight down from max of 191 to 176 lbs today . (3 lbs down since yesterday), with marked improvement in edema. Pt now able to lay flat comfortably.  4. DM: most recent A1c excellent at 6.3%. Managed at home on oral agents. SSI while here.   5. HTN: good control without meds right now. Prefer beta blockers (carvedilol) and avoid RAAS inhibitors in setting of renal injury. Will discuss addition of RAAS with attending in preparation for surgery.  Signed, Berton Bon, NP  03/16/2017, 10:07 AM    I have seen and examined the patient along with Berton Bon, NP  NP.  I have reviewed the chart, notes and new data.  I agree with NP's note.  Key new complaints: breathing better, no longer orthopneic, but still had markedly elevated LVEDP at cath (34 mm Hg) Key examination changes: no rales or JVD, S3 still present and borderline tachycardic Key new findings / data: creatinine improved and not far from his baseline, reviewed cath results with patient and family  PLAN: Consult CV  surgery for CABG (3-vessel CAD including left main and proximal LAD stenoses, decreased EF, DM). Not ready for surgery just yet, needs more diuresis and improved respiratory status. Diuretic dose increased. Delay RAAS inhibitors until after surgery. Excellent Entresto candidate. Delay beta blocker titration until better diuresis.  Thurmon Fair, MD, [facility] [phone] 03/16/2017, 10:39 AM

## 2017-03-16 NOTE — Care Management Note (Addendum)
Case Management Note  Patient Details  Name: Douglas Cannon MRN: [id number] Date of Birth: 1956-05-03  Subjective/Objective: Pt presented as a tx from [facility] for Acute Respiratory Failure and Nstemi. Post cardiac cath 03-15-17 that revealed multivessel CAD. Plan for Surgery to consult. Pt continues on IV Heparin gtt, IV Lasix and IV rocephin.  Pt is from home with his wife.                    Action/Plan: CM will continue to monitor for additional needs.   Expected Discharge Date:                  Expected Discharge Plan:  Home w Home Health Services  In-House Referral:     Discharge planning Services  CM Consult  Post Acute Care Choice:    Choice offered to:     DME Arranged:    DME Agency:     HH Arranged:    HH Agency:     Status of Service:  In process, will continue to follow  If discussed at Long Length of Stay Meetings, dates discussed:  03-22-17  Additional Comments: 1601 03-21-17 Tomi Bamberger, RN,BSN [phone] Plan for CABG 03-22-17. CM will continue to monitor for disposition needs.  Gala Lewandowsky, RN 03/16/2017, 11:17 AM

## 2017-03-17 ENCOUNTER — Other Ambulatory Visit: Payer: Self-pay | Admitting: *Deleted

## 2017-03-17 ENCOUNTER — Inpatient Hospital Stay (HOSPITAL_COMMUNITY): Payer: BLUE CROSS/BLUE SHIELD

## 2017-03-17 DIAGNOSIS — I2510 Atherosclerotic heart disease of native coronary artery without angina pectoris: Secondary | ICD-10-CM

## 2017-03-17 DIAGNOSIS — I25110 Atherosclerotic heart disease of native coronary artery with unstable angina pectoris: Secondary | ICD-10-CM

## 2017-03-17 LAB — BASIC METABOLIC PANEL
Anion gap: 10 (ref 5–15)
BUN: 35 mg/dL — ABNORMAL HIGH (ref 6–20)
CO2: 26 mmol/L (ref 22–32)
Calcium: 8.2 mg/dL — ABNORMAL LOW (ref 8.9–10.3)
Chloride: 101 mmol/L (ref 101–111)
Creatinine, Ser: 1.91 mg/dL — ABNORMAL HIGH (ref 0.61–1.24)
GFR calc Af Amer: 43 mL/min — ABNORMAL LOW (ref >60)
GFR calc non Af Amer: 37 mL/min — ABNORMAL LOW (ref >60)
Glucose, Bld: 158 mg/dL — ABNORMAL HIGH (ref 65–99)
Potassium: 3.5 mmol/L (ref 3.5–5.1)
Sodium: 137 mmol/L (ref 135–145)

## 2017-03-17 LAB — GLUCOSE, CAPILLARY
Glucose-Capillary: 158 mg/dL — ABNORMAL HIGH (ref 65–99)
Glucose-Capillary: 257 mg/dL — ABNORMAL HIGH (ref 65–99)
Glucose-Capillary: 82 mg/dL (ref 65–99)
Glucose-Capillary: 154 mg/dL — ABNORMAL HIGH (ref 65–99)

## 2017-03-17 LAB — CBC
HCT: 27 % — ABNORMAL LOW (ref 39.0–52.0)
Hemoglobin: 9.1 g/dL — ABNORMAL LOW (ref 13.0–17.0)
MCH: 30 pg (ref 26.0–34.0)
MCHC: 33.7 g/dL (ref 30.0–36.0)
MCV: 89.1 fL (ref 78.0–100.0)
Platelets: 275 10*3/uL (ref 150–400)
RBC: 3.03 MIL/uL — ABNORMAL LOW (ref 4.22–5.81)
RDW: 13.4 % (ref 11.5–15.5)
WBC: 11.5 10*3/uL — ABNORMAL HIGH (ref 4.0–10.5)

## 2017-03-17 LAB — SURGICAL PCR SCREEN
MRSA, PCR: NEGATIVE
Staphylococcus aureus: NEGATIVE

## 2017-03-17 LAB — URINALYSIS, ROUTINE W REFLEX MICROSCOPIC
Bacteria, UA: NONE SEEN
Bilirubin Urine: NEGATIVE
Glucose, UA: 150 mg/dL — AB
Ketones, ur: NEGATIVE mg/dL
Leukocytes, UA: NEGATIVE
Nitrite: NEGATIVE
Protein, ur: 100 mg/dL — AB
Specific Gravity, Urine: 1.015 (ref 1.005–1.030)
Squamous Epithelial / HPF: NONE SEEN
pH: 5 (ref 5.0–8.0)

## 2017-03-17 LAB — BRAIN NATRIURETIC PEPTIDE: B Natriuretic Peptide: 2172.1 pg/mL — ABNORMAL HIGH (ref 0.0–100.0)

## 2017-03-17 MED ORDER — SODIUM CHLORIDE 0.9% FLUSH: 3.0000 mL | INTRAVENOUS | Status: DC | PRN

## 2017-03-17 MED ORDER — GADOBENATE DIMEGLUMINE 529 MG/ML IV SOLN
30.0000 mL | Freq: Once | INTRAVENOUS | Status: AC
  Administered 2017-03-17: 28 mL via INTRAVENOUS

## 2017-03-17 MED ORDER — SODIUM CHLORIDE 0.9 % IV SOLN: 250.0000 mL | INTRAVENOUS | Status: DC | PRN

## 2017-03-17 MED ORDER — FUROSEMIDE 40 MG PO TABS
40.0000 mg | ORAL_TABLET | Freq: Two times a day (BID) | ORAL | Status: DC
  Administered 2017-03-18 – 2017-03-19 (×3): 40 mg via ORAL
  Filled 2017-03-17 (×4): qty 1

## 2017-03-17 MED ORDER — ASPIRIN 81 MG PO CHEW
81.0000 mg | CHEWABLE_TABLET | ORAL | Status: AC
  Administered 2017-03-18: 81 mg via ORAL
  Filled 2017-03-17: qty 1

## 2017-03-17 MED ORDER — SODIUM CHLORIDE 0.9 % IV SOLN
INTRAVENOUS | Status: DC
  Administered 2017-03-18: 06:00:00 via INTRAVENOUS

## 2017-03-17 MED ORDER — SODIUM CHLORIDE 0.9% FLUSH: 3.0000 mL | Freq: Two times a day (BID) | INTRAVENOUS | Status: DC

## 2017-03-17 MED ORDER — IPRATROPIUM-ALBUTEROL 0.5-2.5 (3) MG/3ML IN SOLN: 3.0000 mL | RESPIRATORY_TRACT | Status: DC | PRN

## 2017-03-17 NOTE — Consult Note (Signed)
[address]       [place] [address]             [phone]        Jahaan Vanwagner [facility] Medical Record #[id number] Date of Birth: Jul 13, 1956  Referring:  Dr Royann Shivers Primary Care: Dwana Melena, MD  Chief Complaint:    Chief Complaint  Patient presents with  . Shortness of Breath   Patient examined, coronary angiogram and 2-D echocardiogram and chest x-ray images personally reviewed and counseled with patient and wife  History of Present Illness:     61 year old Caucasian male diabetic nonsmoker presented to the ED with acute respiratory failure, PO2 of 39 and chest x-ray showing interstitial pulmonary edema with positive cardiac enzymes. He was stabilized without intubation. He subsequently underwent echocardiogram which showed EF 25%. An echocardiogram one year ago showed EF of 55%. Patient had had less severe but similar episodes of shortness of breath prior to this admission attributed to bilateral pneumonia but probably represented less severe episodes of pulmonary edema from ischemic heart disease. Cardiac echo showed global hypokinesia and EF 25% but without significant MR or TR. Left heart Cardiac catheterization demonstrated LV EDP of 34 mmHg with severe three-vessel diabetic pattern of CAD. The distal RCA appears nongraftable. The LAD appears distally to be potentially graftable. The OM1 and OM 2 distal vessels appeared be graftable.  Patient symptomatically has improved significantly with diuresis. His creatinine is improved to 1.8. Lower extremity ultrasound shows a chronic DVT of his left leg which has chronic swelling problems. No history of pulmonary embolus.  The patient is still highly functional and works daily and is married. He has severe ischemic cardiomyopathy with suboptimal target vessels for grafting and potential problems with conduit. However CBG is his best long-term option for therapy for preservation and potential improvement of LV  function and improved survival.  Prior to surgery patient will need right heart cath to document that his cardiac output is adequate and that his filling pressures have improved with diuretics. He remains on nasal cannula but is able to walk in the hallway and is free of orthopnea. He will need a MRI viability study to demonstrate the potential for reversibility of his ventricular dysfunction. We will also perform vein mapping of the right leg to help assure he has adequate conduit. The patient is right-hand dominant and initially could have left radial artery used as conduit if needed. 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 His left foot has a diabetic ulcer on the lateral aspect dry and neuropathic in appearance  The patient has done well managing his diabetes and his hemoglobin A1c has remained 6. 0-6 0.5. Current Activity/ Functional Status: Patient is married and works as a Production designer, theatre/television/film for [employer]   Zubrod Score: At the time of surgery this patient's most appropriate activity status/level should be described as: []     0    Normal activity, no symptoms []     1    Restricted in physical strenuous activity but ambulatory, able to do out light work [x]     2    Ambulatory and capable of self care, unable to do work activities, up and about                 more than 50%  Of the time                            []   3    Only limited self care, in bed greater than 50% of waking hours []     4    Completely disabled, no self care, confined to bed or chair []     5    Moribund  Past Medical History:  Diagnosis Date  . Anemia   . Anxiety   . CKD (chronic kidney disease) stage 3, GFR 30-59 ml/min   . Diastolic heart failure [facility])    Episode December 2017 in the setting of pneumonia  . Essential hypertension   . History of pneumonia   . Hyperlipemia   . Osteomyelitis (HCC)   . Type 2 diabetes mellitus (HCC)     Past Surgical History:  Procedure Laterality Date    . AMPUTATION Right 12/24/2015   Procedure: PARTIAL THIRD RAY AMPUTATION OF THE RIGHT FOOT (AMPUTATION OF THE THIRD TOE AND A PORTION OF THE THIRD METATARSAL);  Surgeon: Ferman Hamming, DPM;  Location: [facility];  Service: Podiatry;  Laterality: Right;  . LEFT HEART CATH AND CORONARY ANGIOGRAPHY N/A 03/15/2017   Procedure: Left Heart Cath and Coronary Angiography;  Surgeon: Lennette Bihari, MD;  Location: [facility] INVASIVE CV LAB;  Service: Cardiovascular;  Laterality: N/A;    History  Smoking Status  . Never Smoker  Smokeless Tobacco  . Never Used    History  Alcohol Use No    Social History   Social History  . Marital status: Married    Spouse name: N/A  . Number of children: N/A  . Years of education: N/A   Occupational History  . Not on file.   Social History Main Topics  . Smoking status: Never Smoker  . Smokeless tobacco: Never Used  . Alcohol use No  . Drug use: No  . Sexual activity: Not on file   Other Topics Concern  . Not on file   Social History Narrative  . No narrative on file    No Known Allergies  Current Facility-Administered Medications  Medication Dose Route Frequency Provider Last Rate Last Dose  . 0.9 %  sodium chloride infusion  250 mL Intravenous PRN Omair Latif Sheikh, DO      . 0.9 %  sodium chloride infusion  250 mL Intravenous PRN Lennette Bihari, MD      . acetaminophen (TYLENOL) tablet 650 mg  650 mg Oral Q4H PRN Lennette Bihari, MD      . aspirin chewable tablet 81 mg  81 mg Oral Daily Lennette Bihari, MD   81 mg at 03/17/17 0815  . carvedilol (COREG) tablet 3.125 mg  3.125 mg Oral BID WC Lennette Bihari, MD   3.125 mg at 03/17/17 0815  . cefTRIAXone (ROCEPHIN) 1 g in dextrose 5 % 50 mL IVPB  1 g Intravenous Q24H Dron Jaynie Collins, MD   Stopped at 03/17/17 0845  . chlorhexidine (PERIDEX) 0.12 % solution 15 mL  15 mL Mouth Rinse BID Omair Latif Sheikh, DO   15 mL at 03/17/17 1000  . citalopram (CELEXA) tablet 20 mg  20 mg Oral Daily Dron Jaynie Collins, MD   20 mg at 03/17/17 0815  . diazepam (VALIUM) tablet 5 mg  5 mg Oral Q6H PRN Lennette Bihari, MD   5 mg at 03/16/17 2231  . enoxaparin (LOVENOX) injection 40 mg  40 mg Subcutaneous Q24H Zannie Cove, MD      . furosemide (LASIX) tablet 40 mg  40 mg Oral BID Thurmon Fair, MD      .  hydrALAZINE (APRESOLINE) injection 10 mg  10 mg Intravenous Q6H PRN Haydee Salter, MD      . insulin aspart (novoLOG) injection 0-9 Units  0-9 Units Subcutaneous TID WC Merlene Laughter, DO   2 Units at 03/17/17 0800  . ipratropium-albuterol (DUONEB) 0.5-2.5 (3) MG/3ML nebulizer solution 3 mL  3 mL Nebulization Q4H PRN Zannie Cove, MD      . levalbuterol Pauline Aus) nebulizer solution 0.63 mg  0.63 mg Nebulization Q3H PRN Norton Blizzard, NP      . MEDLINE mouth rinse  15 mL Mouth Rinse q12n4p Omair Latif Sheikh, DO   15 mL at 03/16/17 1542  . ondansetron (ZOFRAN) injection 4 mg  4 mg Intravenous Q6H PRN Lennette Bihari, MD      . potassium chloride SA (K-DUR,KLOR-CON) CR tablet 40 mEq  40 mEq Oral Daily Dron Jaynie Collins, MD   40 mEq at 03/17/17 0815  . rosuvastatin (CRESTOR) tablet 5 mg  5 mg Oral QHS Omair Latif Sheikh, DO   5 mg at 03/16/17 2230  . sodium chloride flush (NS) 0.9 % injection 3 mL  3 mL Intravenous Q12H Omair Latif Sheikh, DO   3 mL at 03/17/17 0815  . sodium chloride flush (NS) 0.9 % injection 3 mL  3 mL Intravenous PRN Omair Latif Sheikh, DO      . sodium chloride flush (NS) 0.9 % injection 3 mL  3 mL Intravenous Q12H Lennette Bihari, MD   3 mL at 03/16/17 2230  . sodium chloride flush (NS) 0.9 % injection 3 mL  3 mL Intravenous PRN Lennette Bihari, MD        Prescriptions Prior to Admission  Medication Sig Dispense Refill Last Dose  . amLODipine (NORVASC) 5 MG tablet Take 5 mg by mouth 2 (two) times daily.   03/10/2017 at Unknown time  . aspirin EC 81 MG tablet Take 81 mg by mouth daily.   03/10/2017 at Unknown time  . carvedilol (COREG) 6.25 MG tablet Take 6.25 mg by mouth 2  (two) times daily with a meal.   03/10/2017 at 1900  . citalopram (CELEXA) 20 MG tablet Take 20 mg by mouth daily.   03/10/2017 at Unknown time  . Cyanocobalamin (B-12 PO) Take 1 tablet by mouth daily.   03/10/2017 at Unknown time  . Cyanocobalamin 2500 MCG TABS Take 1 tablet by mouth daily.   03/10/2017 at Unknown time  . DM-Phenylephrine-Acetaminophen (ALKA-SELTZER PLS SINUS & COUGH PO) Take 1 tablet by mouth daily as needed.   03/10/2017 at Unknown time  . furosemide (LASIX) 40 MG tablet Take 1 tablet (40 mg total) by mouth daily. Start on 2/14 (Patient taking differently: Take 40 mg by mouth daily as needed for fluid. Start on 2/14) 30 tablet 1 unknown  . glimepiride (AMARYL) 4 MG tablet Take 4 mg by mouth daily with breakfast.   03/10/2017 at Unknown time  . metFORMIN (GLUCOPHAGE) 500 MG tablet Take by mouth 2 (two) times daily with a meal.   03/10/2017 at Unknown time  . Pseudoephedrine-APAP-DM (DAYQUIL MULTI-SYMPTOM PO) Take 2 tablets by mouth daily as needed.   03/10/2017 at Unknown time  . rosuvastatin (CRESTOR) 5 MG tablet Take 5 mg by mouth daily.   03/10/2017 at Unknown time  . tamsulosin (FLOMAX) 0.4 MG CAPS capsule Take 0.4 mg by mouth.   03/10/2017 at Unknown time  . triamcinolone ointment (KENALOG) 0.1 % Apply 1 application topically 2 (two) times daily.   03/10/2017  at Unknown time  . Zinc 50 MG TABS Take 1 tablet by mouth daily.   03/10/2017 at Unknown time    Family History  Problem Relation Age of Onset  . Adopted: Yes     Review of Systems:       Cardiac Review of Systems:  Chest Pain [  n  ]  Resting SOB [ y  ] Exertional SOB  Cove.Etienne  ]  Orthopnea Cove.Etienne  ]   Pedal Edema [  y ]    Palpitations [ y ] Syncope  Milo.Brash  ]   Presyncope Cove.Etienne   ]  General Review of Systems: [Y] = yes [  ]=no Constitional: recent weight change [  ]; anorexia [  ]; fatigue [  ]; nausea [  ]; night sweats [  ]; fever [  ]; or chills [  ]                                                               Dental: poor  dentition[ n ]; Last Dentist visit:   Eye : blurred vision [  ]; diplopia [   ]; vision changes [  ];  Amaurosis fugax[  ]; Resp: cough Patrice.Shin  ];  wheezing[  ];  hemoptysis[  ]; shortness of breath[y  ]; paroxysmal nocturnal dyspnea[  ]; dyspnea on exertion[y  ]; or orthopnea[y  ];  GI:  gallstones[  ], vomiting[  ];  dysphagia[  ]; melena[  ];  hematochezia [  ]; heartburn[  ];   Hx of  Colonoscopy[  ]; GU: kidney stones [  ]; hematuria[  ];   dysuria [  ];  nocturia[  ];  history of     obstruction [  ]; urinary frequency [  ]             Skin: rash, swelling[  ];, hair loss[  ];  peripheral edema[  ];  or itching[  ]; Musculosketetal: myalgias[  ];  joint swelling[  ];  joint erythema[  ];  joint pain[  ];  back pain[  ];  Heme/Lymph: bruising[ y ];  bleeding[  ];  anemia[  ];  Neuro: TIA[  ];  headaches[  ];  stroke[  ];  vertigo[  ];  seizures[  ];   paresthesias[  ];  difficulty walking[  ];  Psych:depression[  ]; anxiety[  ];  Endocrine: diabetes[y with peripheral neuropathy  ];  thyroid dysfunction[  ];  Immunizations: Flu [  ]; Pneumococcal[  ];  Other: No history of thoracic trauma rib fracture or pneumothorax  Physical Exam: BP 136/78 (BP Location: Left Arm)   Pulse 76   Temp 98.6 F (37 C) (Oral)   Resp 16   Ht 5\' 10"  (1.778 m)   Wt 176 lb 8 oz (80.1 kg)   SpO2 98%   BMI 25.33 kg/m        Physical Exam  General: Alert and oriented well nourished middle-aged white male HEENT: Normocephalic pupils equal , dentition adequate Neck: Supple without JVD, adenopathy, or bruit Chest: Clear to auscultation, symmetrical breath sounds, no rhonchi, no tenderness             or deformity Cardiovascular: Regular rate and rhythm, no murmur, no gallop,  peripheral pulses             palpable in all extremities Abdomen:  Soft, nontender, no palpable mass or organomegaly Extremities: Warm, well-perfused, no clubbing cyanosis edema or tenderness, surgical scar in right foot, neuropathic  ulcer lateral aspect of left foot              no venous stasis changes of the legs Rectal/GU: Deferred Neuro: Grossly non--focal and symmetrical throughout Skin: Clean and dry without rash or ulceration except for left foot ulcer   Diagnostic Studies & Laboratory data:     Recent Radiology Findings:   Chest x-ray images personally reviewed  I have independently reviewed the above radiologic studies.  Recent Lab Findings: Lab Results  Component Value Date   WBC 11.5 (H) 03/17/2017   HGB 9.1 (L) 03/17/2017   HCT 27.0 (L) 03/17/2017   PLT 275 03/17/2017   GLUCOSE 158 (H) 03/17/2017   CHOL 103 03/12/2017   TRIG 114 03/12/2017   HDL 45 03/12/2017   LDLCALC 35 03/12/2017   ALT 16 (L) 03/12/2017   AST 78 (H) 03/12/2017   NA 137 03/17/2017   K 3.5 03/17/2017   CL 101 03/17/2017   CREATININE 1.91 (H) 03/17/2017   BUN 35 (H) 03/17/2017   CO2 26 03/17/2017   TSH 2.600 03/12/2017   INR 1.15 03/15/2017   HGBA1C 6.3 (H) 03/11/2017      Assessment / Plan:     Severe three-vessel coronary artery disease with diabetic pattern Ischemic cardiomyopathy EF 25% with elevated LVEDP and acute systolic congestive heart failure Acute respiratory failure from pulmonary edema Chronic DVT of left lower extremity  Patient would benefit from a high risk multivessel CABG. Prior to her surgery right-heart cath is recommended to demonstrate adequate cardiac output and improvement in his filling pressures Myocardial viability study is recommended using cardiac MRI to assess potential for LV functional improvement Surgery will be scheduled first available or date next week. I discussed the procedure with the patient and  wife and they understand the indications and and benefits and risks and agreed to proceed.     I  03/17/2017 11:20 AM

## 2017-03-17 NOTE — Progress Notes (Signed)
CARDIAC REHAB PHASE I   Pt just back from MRI, feels tired. Would like to walk later this afternoon. He is 93 3L in bed. Sat up to go to bathroom and 90 RA on EOB. We can eval walking on RA tomorrow. Discussed sternal precautions, IS, mobility, d/c planning. Voiced understanding. Wife can mostly be with him at d/c. Will f/u tomorrow. [phone]  Harriet MassonRandi Kristan [place] Franson CES, ACSM 03/17/2017 2:47 PM

## 2017-03-17 NOTE — Progress Notes (Signed)
PROGRESS NOTE    Douglas BondDavid Cannon  ZOX:[id number] DOB: 09/05/1956 DOA: 03/11/2017 PCP: Douglas MelenaZack Hall, MD   Brief Narrative: 61 y.o. male with medical history significant of HTN, HLD, DM Type 2, BPH, CKD Stage 3, Anemia, Anxiety, and ?Diastolic HF with other comorbids who presented to the [facility] ED with worsening shortness of breath and leg swelling for about 2 days. On admission patient was found to have elevated troponin, BNP concerning for CHF and possible pneumonia. Patient was started on heparin drip, IV antibiotics, nitro drip and [facility]. Evaluated by Douglas Cannon and Douglas Cannon team.  Assessment & Plan:  #Acute hypoxic respiratory failure due to acute CHF/pulm edema and viral illness-rhinovirus -OFF BIPAP, Hypoxia improving with diuresis -continue IV lasix-see below -repeat CXR more consistent with CHF -Respiratory viral studies positive for rhinovirus. -also on Abx for pneumonia, off Azithromycin and now on ceftriaxone to complete 7day course -wean O2, ambulate, incentive spirometry  #  NSTEMI: Peak troponin level 34.9. - OFF IV heparin and nitro gtt now - s/p Left  Heart cath 5/1 with multivessel CAD, CVTS consult for CABG - volume status improving, lasix changed to Po today -Continue aspirin, statin. -start BB in next 1-2days  #Acute systolic congestive heart failure.  -ischemic cardiomyopathy -Echo showed EF of 30-35%  -diuresed with IV lasix, creatinine trending up, lasix changed to PO  #Chronic DVT -chronic deep vein thrombosis involving a minute branch of the left distal posterior tibial vein of   the left lower extremity, does not need any RX for this, called and d/w with [facility] Douglas Cannon  #Acute on chronic kidney disease stage III: -baseline serum creatinine level around 1.5-2. -Ultrasound of kidneys with medical renal disease.  -Serum creatinine fluctuating with diuresis, now 1.9, diuretics changed to  PO -Avoid nephrotoxins, monitor Bmet  #Hypokalemia in the setting of diuretics: Improved. Change from twice a day to daily dose.   #Hyperlipidemia: Continue statin  #Type 2 diabetes: - Continue sliding scale.  - Hba1c 6.3  DVT prophylaxis: Lovenox Code Status: Full code Family Communication: None at bedside Disposition Plan: Home after CABG likely   Consultants:   Douglas Cannon  Douglas Cannon  Procedures: Noninvasive ventilation Antimicrobials: Azithromycin and ceftriaxone since 4/27  Subjective: Breathing better, ambulating more  Objective: Vitals:   03/16/17 1949 03/17/17 0042 03/17/17 0531 03/17/17 0804  BP: 133/85 (!) 137/59 130/85 136/78  Pulse: 88 73 90 76  Resp: 20 18 (!) 23 16  Temp: 98.8 F (37.1 C) 98.7 F (37.1 C) 98.9 F (37.2 C) 98.6 F (37 C)  TempSrc: Oral Oral Oral Oral  SpO2: 98% 90% 90% 98%  Weight:   80.1 kg (176 lb 8 oz)   Height:        Intake/Output Summary (Last 24 hours) at 03/17/17 1113 Last data filed at 03/17/17 0042  Gross per 24 hour  Intake            496.3 ml  Output              275 ml  Net            221.3 ml   Filed Weights   03/15/17 0400 03/16/17 0443 03/17/17 0531  Weight: 81.3 kg (179 lb 3.7 oz) 79.9 kg (176 lb 3.2 oz) 80.1 kg (176 lb 8 oz)    Examination:  General exam: good spirits, sitting in bed, AAOX3 Respiratory system: more clear today Cardiovascular system: Regular rate and rhythm, S1  and S2 normal Gastrointestinal system: soft , NT, BS present Central nervous system: Alert awake and non focal Extremities: no edema Skin: no rashes  Data Reviewed: I have personally reviewed following labs and imaging studies  CBC:  Recent Labs Lab 03/11/17 0920  03/12/17 0343 03/13/17 0506 03/14/17 0313 03/15/17 0420 03/16/17 0746 03/17/17 0237  WBC 27.3*  --  15.8* 12.5* 10.5 12.3* 14.1* 11.5*  NEUTROABS 24.9*  --  13.3*  --   --   --   --   --   HGB 12.4*  < > 9.7* 9.6* 8.6* 9.3* 9.8* 9.1*  HCT  36.6*  < > 29.4* 28.4* 24.8* 28.0* 29.9* 27.0*  MCV 90.4  --  89.1 89.6 89.2 89.2 89.3 89.1  PLT 289  --  212 203 188 251 295 275  < > = values in this interval not displayed. Basic Metabolic Panel:  Recent Labs Lab 03/12/17 0343 03/13/17 0506 03/14/17 0313 03/15/17 0420 03/16/17 0746 03/17/17 0237  NA 138 139 137 139 138 137  K 3.8 3.3* 3.2* 4.1 3.7 3.5  CL 106 106 104 106 100* 101  CO2 22 24 24 26 26 26   GLUCOSE 177* 117* 116* 149* 143* 158*  BUN 70* 52* 43* 37* 33* 35*  CREATININE 2.82* 2.22* 2.08* 1.90* 1.78* 1.91*  CALCIUM 8.2* 8.0* 7.7* 8.1* 8.4* 8.2*  MG 2.0  --  1.8  --   --   --   PHOS 4.3  --   --   --   --   --    GFR: Estimated Creatinine Clearance: 43 mL/min (A) (by C-G formula based on SCr of 1.91 mg/dL (H)). Liver Function Tests:  Recent Labs Lab 03/11/17 0920 03/12/17 0343  AST 70* 78*  ALT 18 16*  ALKPHOS 50 37*  BILITOT 0.6 0.2*  PROT 6.7 5.4*  ALBUMIN 3.1* 2.4*   No results for input(s): LIPASE, AMYLASE in the last 168 hours. No results for input(s): AMMONIA in the last 168 hours. Coagulation Profile:  Recent Labs Lab 03/15/17 0420  INR 1.15   Cardiac Enzymes:  Recent Labs Lab 03/11/17 1543 03/11/17 2050 03/12/17 0343 03/13/17 0506  TROPONINI 16.91* 29.89* 34.91* 18.72*   BNP (last 3 results) No results for input(s): PROBNP in the last 8760 hours. HbA1C: No results for input(s): HGBA1C in the last 72 hours. CBG:  Recent Labs Lab 03/16/17 0730 03/16/17 1120 03/16/17 1637 03/16/17 2105 03/17/17 0757  GLUCAP 145* 226* 104* 226* 158*   Lipid Profile: No results for input(s): CHOL, HDL, LDLCALC, TRIG, CHOLHDL, LDLDIRECT in the last 72 hours. Thyroid Function Tests: No results for input(s): TSH, T4TOTAL, FREET4, T3FREE, THYROIDAB in the last 72 hours. Anemia Panel: No results for input(s): VITAMINB12, FOLATE, FERRITIN, TIBC, IRON, RETICCTPCT in the last 72 hours. Sepsis Labs:  Recent Labs Lab 03/11/17 0920 03/11/17 1223  03/11/17 1753 03/13/17 0506 03/15/17 0420  PROCALCITON  --   --  2.27 0.87 0.27  LATICACIDVEN 4.0* 2.0* 1.9  --   --     Recent Results (from the past 240 hour(s))  Culture, blood (routine x 2)     Status: None   Collection Time: 03/11/17  9:20 AM  Result Value Ref Range Status   Specimen Description BLOOD LEFT ARM  Final   Special Requests   Final    BOTTLES DRAWN AEROBIC AND ANAEROBIC Blood Culture results may not be optimal due to an inadequate volume of blood received in culture bottles   Culture NO  GROWTH 5 DAYS  Final   Report Status 03/16/2017 FINAL  Final  Culture, blood (routine x 2)     Status: None   Collection Time: 03/11/17 10:22 AM  Result Value Ref Range Status   Specimen Description BLOOD LEFT HAND  Final   Special Requests   Final    BOTTLES DRAWN AEROBIC AND ANAEROBIC Blood Culture results may not be optimal due to an inadequate volume of blood received in culture bottles   Culture NO GROWTH 5 DAYS  Final   Report Status 03/16/2017 FINAL  Final  Respiratory Panel by PCR     Status: Abnormal   Collection Time: 03/11/17  9:31 PM  Result Value Ref Range Status   Adenovirus NOT DETECTED NOT DETECTED Final   Coronavirus 229E NOT DETECTED NOT DETECTED Final   Coronavirus HKU1 NOT DETECTED NOT DETECTED Final   Coronavirus NL63 NOT DETECTED NOT DETECTED Final   Coronavirus OC43 NOT DETECTED NOT DETECTED Final   Metapneumovirus NOT DETECTED NOT DETECTED Final   Rhinovirus / Enterovirus DETECTED (A) NOT DETECTED Final   Influenza A NOT DETECTED NOT DETECTED Final   Influenza B NOT DETECTED NOT DETECTED Final   Parainfluenza Virus 1 NOT DETECTED NOT DETECTED Final   Parainfluenza Virus 2 NOT DETECTED NOT DETECTED Final   Parainfluenza Virus 3 NOT DETECTED NOT DETECTED Final   Parainfluenza Virus 4 NOT DETECTED NOT DETECTED Final   Respiratory Syncytial Virus NOT DETECTED NOT DETECTED Final   Bordetella pertussis NOT DETECTED NOT DETECTED Final   Chlamydophila  pneumoniae NOT DETECTED NOT DETECTED Final   Mycoplasma pneumoniae NOT DETECTED NOT DETECTED Final  MRSA PCR Screening     Status: None   Collection Time: 03/11/17  9:31 PM  Result Value Ref Range Status   MRSA by PCR NEGATIVE NEGATIVE Final    Comment:        The GeneXpert MRSA Assay (FDA approved for NASAL specimens only), is one component of a comprehensive MRSA colonization surveillance program. It is not intended to diagnose MRSA infection nor to guide or monitor treatment for MRSA infections.          Radiology Studies: No results found.      Scheduled Meds: . aspirin  81 mg Oral Daily  . carvedilol  3.125 mg Oral BID WC  . chlorhexidine  15 mL Mouth Rinse BID  . citalopram  20 mg Oral Daily  . enoxaparin (LOVENOX) injection  40 mg Subcutaneous Q24H  . furosemide  40 mg Oral BID  . insulin aspart  0-9 Units Subcutaneous TID WC  . mouth rinse  15 mL Mouth Rinse q12n4p  . potassium chloride  40 mEq Oral Daily  . rosuvastatin  5 mg Oral QHS  . sodium chloride flush  3 mL Intravenous Q12H  . sodium chloride flush  3 mL Intravenous Q12H   Continuous Infusions: . sodium chloride    . sodium chloride    . cefTRIAXone (ROCEPHIN)  IV Stopped (03/17/17 0845)     LOS: 6 days    Zannie Cove, MD [facility] Pager [phone] If 7PM-7AM, please contact night-coverage [email or web address] Password [email or web address] 03/17/2017, 11:13 AM

## 2017-03-17 NOTE — Progress Notes (Signed)
Progress Note  Patient Name: Douglas BondDavid Cannon Date of Encounter: 03/17/2017  Primary Cardiologist: Diona BrownerMcDowell  Subjective   Pt feeling well. Walked entire unit twice yesterday on oxygen without any chest pain, shortness of breath or lightheadedness. Is currently on 3.5L oxygen. Is using IS in preparation for surgery. Output was not accurate yesterday as pt had output with his BM's.   Inpatient Medications    Scheduled Meds: . aspirin  81 mg Oral Daily  . carvedilol  3.125 mg Oral BID WC  . chlorhexidine  15 mL Mouth Rinse BID  . citalopram  20 mg Oral Daily  . enoxaparin (LOVENOX) injection  40 mg Subcutaneous Q24H  . furosemide  40 mg Intravenous Q12H  . insulin aspart  0-9 Units Subcutaneous TID WC  . mouth rinse  15 mL Mouth Rinse q12n4p  . potassium chloride  40 mEq Oral Daily  . rosuvastatin  5 mg Oral QHS  . sodium chloride flush  3 mL Intravenous Q12H  . sodium chloride flush  3 mL Intravenous Q12H   Continuous Infusions: . sodium chloride    . sodium chloride    . cefTRIAXone (ROCEPHIN)  IV Stopped (03/16/17 1119)   PRN Meds: sodium chloride, sodium chloride, acetaminophen, diazepam, hydrALAZINE, ipratropium-albuterol, levalbuterol, ondansetron (ZOFRAN) IV, sodium chloride flush, sodium chloride flush   Vital Signs    Vitals:   03/16/17 1640 03/16/17 1949 03/17/17 0042 03/17/17 0531  BP:  133/85 (!) 137/59 130/85  Pulse:  88 73 90  Resp:  20 18 (!) 23  Temp:  98.8 F (37.1 C) 98.7 F (37.1 C) 98.9 F (37.2 C)  TempSrc:  Oral Oral Oral  SpO2: 96% 98% 90% 90%  Weight:    176 lb 8 oz (80.1 kg)  Height:        Intake/Output Summary (Last 24 hours) at 03/17/17 0722 Last data filed at 03/17/17 0042  Gross per 24 hour  Intake            736.3 ml  Output              275 ml  Net            461.3 ml   Filed Weights   03/15/17 0400 03/16/17 0443 03/17/17 0531  Weight: 179 lb 3.7 oz (81.3 kg) 176 lb 3.2 oz (79.9 kg) 176 lb 8 oz (80.1 kg)    Telemetry    NSR  in the 70's - Personally Reviewed  ECG    No new tracing  Physical Exam   Smiling, comfortable, right wrist without hematoma or tenderness GEN: No acute distress.   Neck: No JVD Cardiac: RRR, no murmurs, rubs, S4 gallop, no edema Respiratory: Clear to auscultation bilaterally, diminished overall. GI: Soft, nontender, non-distended  MS: No edema; No deformity. Neuro:  Nonfocal  Psych: Normal affect   Labs    Chemistry Recent Labs Lab 03/11/17 0920  03/12/17 0343  03/15/17 0420 03/16/17 0746 03/17/17 0237  NA 136  < > 138  < > 139 138 137  K 4.4  < > 3.8  < > 4.1 3.7 3.5  CL 102  < > 106  < > 106 100* 101  CO2 17*  --  22  < > 26 26 26   GLUCOSE 265*  < > 177*  < > 149* 143* 158*  BUN 64*  < > 70*  < > 37* 33* 35*  CREATININE 2.86*  < > 2.82*  < > 1.90* 1.78* 1.91*  CALCIUM 8.7*  --  8.2*  < > 8.1* 8.4* 8.2*  PROT 6.7  --  5.4*  --   --   --   --   ALBUMIN 3.1*  --  2.4*  --   --   --   --   AST 70*  --  78*  --   --   --   --   ALT 18  --  16*  --   --   --   --   ALKPHOS 50  --  37*  --   --   --   --   BILITOT 0.6  --  0.2*  --   --   --   --   GFRNONAA 23*  --  23*  < > 37* 40* 37*  GFRAA 26*  --  27*  < > 43* 46* 43*  ANIONGAP 17*  --  10  < > 7 12 10   < > = values in this interval not displayed.   Hematology  Recent Labs Lab 03/15/17 0420 03/16/17 0746 03/17/17 0237  WBC 12.3* 14.1* 11.5*  RBC 3.14* 3.35* 3.03*  HGB 9.3* 9.8* 9.1*  HCT 28.0* 29.9* 27.0*  MCV 89.2 89.3 89.1  MCH 29.6 29.3 30.0  MCHC 33.2 32.8 33.7  RDW 13.4 13.0 13.4  PLT 251 295 275    Cardiac Enzymes  Recent Labs Lab 03/11/17 1543 03/11/17 2050 03/12/17 0343 03/13/17 0506  TROPONINI 16.91* 29.89* 34.91* 18.72*     Recent Labs Lab 03/11/17 0921  TROPIPOC 5.37*     BNP  Recent Labs Lab 03/11/17 0920  BNP 1,836.0*     DDimer   Recent Labs Lab 03/11/17 1753  DDIMER 0.48     Radiology    No results found.  Cardiac Studies   Echo 03/12/2017 - Left  ventricle: Septal apical inferior apical and distal anterior wall hypokinesis The cavity size was moderately dilated. Wall thickness was normal. Systolic function was moderately to severely reduced. The estimated ejection fraction was in the range of 30% to 35%. Doppler parameters are consistent with both elevated ventricular end-diastolic filling pressure and elevated left atrial filling pressure. - Aortic valve: There was mild regurgitation. - Mitral valve: There was mild regurgitation. - Atrial septum: No defect or patent foramen ovale was identified. - Pulmonary arteries: PA peak pressure: 34 mm Hg (S). - Pericardium, extracardiac: Small pericardial effusion no tamponade. ___________________________________________________________________________________  Cardiac catheterization 03/15/2017 Conclusion    LM lesion, 90 %stenosed.  Ost LAD to Prox LAD lesion, 95 %stenosed.  Ost 1st Diag to 1st Diag lesion, 90 %stenosed.  Ost 3rd Mrg to 3rd Mrg lesion, 95 %stenosed.  Mid Cx to Dist Cx lesion, 90 %stenosed.  RPDA lesion, 85 %stenosed.  Mid RCA lesion, 20 %stenosed.  Acute Mrg lesion, 95 %stenosed.   Findings are compatible with a significant ischemic cardiomyopathy in this patient with echo Doppler documentation of an EF of 30-35%.  Severe multivessel CAD with coronary calcification and diffuse 90-95% near ostial to mid LAD stenoses with diffuse 90% stenosis in the first diagonal vessel; 95 and 90% diffuse stenoses in segmental obtuse marginal branches of the left circumflex coronary artery with good distal targets, and diffuse 80% to 90% PDA stenosis with 20% mid RCA stenosis and 95% proximal stenosis and a small proximal branch of the RCA.  LVEDP 34 mm Hg.  RECOMMENDATION: Surgical consultation will be obtained for CABG revascularization surgery.    Diagnostic Diagram  Patient Profile     61 y.o. male presenting with acute respiratory  hypoxic failure, at least partly due to acute pulmonary edema, labs consistent with acute MI (without angina), moderately reduced LVEF 30-35% with regional abnormalities, on background of DM and stage 3 CKD.  Assessment & Plan    1. NSTEMI -ECG suggest posterior MI, echo shows LAD territory abnormalities and EF is lower than expected for degree of enzyme release. Cardiac catheterization showed severe multivessel CAD, see report above. Surgical consultation will be obtained for revascularization surgery (3-vessel CAD including left main and proximal LAD stenoses, decreased EF, DM). Pt is still not ready for surgery. Needs more diuresis and improvement in respiratory status. Still on oxygen 3.5L.   2. Acute on chronic stage 3 renal failure: baseline creatinine probably 1.4-1.7, but has been >2 as recently as Feb 2017. Creatinine trended down to 1.78, but up to 1.91 today, possibly related to contrast during cath, although exposure was limited due to renal function.  3. CHF: improving left heart failure, on lasix 40 mg IV bid, decreasing oxygen demand, now on nasal cannula. I&O net -5.3 L, positive .4L yesterday and Weight down from max of 191 to 176 lbs, today same as yesterday. Edema resolved and breathing steadily improving, but slowly.   4. DM: most recent A1c excellent at 6.3%. Managed at home on oral agents. SSI while here.   5. HTN: good control without meds right now. Prefer beta blockers (carvedilol) and avoid RAAS inhibitors in setting of renal injury. Plan for RAAS inhibitor after surgery. Excellent Entresto candidate.   Signed, Berton Bon, NP  03/17/2017, 7:22 AM    I have seen and examined the patient along with Berton Bon, NP .  I have reviewed the chart, notes and new data.  I agree with NP's note.  Key new complaints: feels substantially better Key examination changes: lying flat, O2 by [place], no edema, intermittent S3 still present Key new findings / data: creat inching back up  at 1.9  PLAN: cMRI for viability and RHC in AM in anticipation of possible CABG early next week. Switch to PO diuretics. Monitor renal function.  Thurmon Fair, MD, [facility] [phone] 03/17/2017, 10:27 AM

## 2017-03-18 ENCOUNTER — Inpatient Hospital Stay (HOSPITAL_COMMUNITY): Payer: BLUE CROSS/BLUE SHIELD

## 2017-03-18 ENCOUNTER — Encounter (HOSPITAL_COMMUNITY): Payer: BLUE CROSS/BLUE SHIELD

## 2017-03-18 ENCOUNTER — Encounter (HOSPITAL_COMMUNITY)
Admission: EM | Disposition: A | Discharge status: Home / Self Care | Payer: Self-pay | Source: Home / Self Care | Attending: Internal Medicine

## 2017-03-18 ENCOUNTER — Encounter (HOSPITAL_COMMUNITY): Payer: Self-pay | Admitting: Cardiology

## 2017-03-18 DIAGNOSIS — I255 Ischemic cardiomyopathy: Secondary | ICD-10-CM

## 2017-03-18 DIAGNOSIS — I509 Heart failure, unspecified: Secondary | ICD-10-CM

## 2017-03-18 DIAGNOSIS — Z01810 Encounter for preprocedural cardiovascular examination: Secondary | ICD-10-CM

## 2017-03-18 DIAGNOSIS — I25110 Atherosclerotic heart disease of native coronary artery with unstable angina pectoris: Secondary | ICD-10-CM

## 2017-03-18 DIAGNOSIS — J9600 Acute respiratory failure, unspecified whether with hypoxia or hypercapnia: Secondary | ICD-10-CM

## 2017-03-18 HISTORY — PX: RIGHT HEART CATH: CATH118263

## 2017-03-18 LAB — BASIC METABOLIC PANEL
Anion gap: 9 (ref 5–15)
BUN: 31 mg/dL — ABNORMAL HIGH (ref 6–20)
CO2: 28 mmol/L (ref 22–32)
Calcium: 8.3 mg/dL — ABNORMAL LOW (ref 8.9–10.3)
Chloride: 101 mmol/L (ref 101–111)
Creatinine, Ser: 1.83 mg/dL — ABNORMAL HIGH (ref 0.61–1.24)
GFR calc Af Amer: 45 mL/min — ABNORMAL LOW (ref >60)
GFR calc non Af Amer: 39 mL/min — ABNORMAL LOW (ref >60)
Glucose, Bld: 140 mg/dL — ABNORMAL HIGH (ref 65–99)
Potassium: 3.9 mmol/L (ref 3.5–5.1)
Sodium: 138 mmol/L (ref 135–145)

## 2017-03-18 LAB — POCT I-STAT 3, VENOUS BLOOD GAS (G3P V)
Acid-Base Excess: 3 mmol/L — ABNORMAL HIGH (ref 0.0–2.0)
Acid-Base Excess: 3 mmol/L — ABNORMAL HIGH (ref 0.0–2.0)
Bicarbonate: 27.6 mmol/L (ref 20.0–28.0)
Bicarbonate: 27.9 mmol/L (ref 20.0–28.0)
O2 Saturation: 57 %
O2 Saturation: 54 %
TCO2: 29 mmol/L (ref 0–100)
TCO2: 29 mmol/L (ref 0–100)
pCO2, Ven: 41.7 mmHg — ABNORMAL LOW (ref 44.0–60.0)
pCO2, Ven: 41.8 mmHg — ABNORMAL LOW (ref 44.0–60.0)
pH, Ven: 7.429 (ref 7.250–7.430)
pH, Ven: 7.433 — ABNORMAL HIGH (ref 7.250–7.430)
pO2, Ven: 29 mmHg — CL (ref 32.0–45.0)
pO2, Ven: 28 mmHg — CL (ref 32.0–45.0)

## 2017-03-18 LAB — CBC
HCT: 27.4 % — ABNORMAL LOW (ref 39.0–52.0)
HCT: 27.5 % — ABNORMAL LOW (ref 39.0–52.0)
Hemoglobin: 9.1 g/dL — ABNORMAL LOW (ref 13.0–17.0)
Hemoglobin: 9.3 g/dL — ABNORMAL LOW (ref 13.0–17.0)
MCH: 29.6 pg (ref 26.0–34.0)
MCH: 30.4 pg (ref 26.0–34.0)
MCHC: 33.2 g/dL (ref 30.0–36.0)
MCHC: 33.8 g/dL (ref 30.0–36.0)
MCV: 89.3 fL (ref 78.0–100.0)
MCV: 89.9 fL (ref 78.0–100.0)
Platelets: 265 10*3/uL (ref 150–400)
Platelets: 280 10*3/uL (ref 150–400)
RBC: 3.07 MIL/uL — ABNORMAL LOW (ref 4.22–5.81)
RBC: 3.06 MIL/uL — ABNORMAL LOW (ref 4.22–5.81)
RDW: 13.3 % (ref 11.5–15.5)
RDW: 13.3 % (ref 11.5–15.5)
WBC: 10.8 10*3/uL — ABNORMAL HIGH (ref 4.0–10.5)
WBC: 8.8 10*3/uL (ref 4.0–10.5)

## 2017-03-18 LAB — CREATININE, SERUM
Creatinine, Ser: 1.64 mg/dL — ABNORMAL HIGH (ref 0.61–1.24)
GFR calc Af Amer: 51 mL/min — ABNORMAL LOW (ref >60)
GFR calc non Af Amer: 44 mL/min — ABNORMAL LOW (ref >60)

## 2017-03-18 LAB — HEPATIC FUNCTION PANEL
ALT: 10 U/L — ABNORMAL LOW (ref 17–63)
AST: 16 U/L (ref 15–41)
Albumin: 2.1 g/dL — ABNORMAL LOW (ref 3.5–5.0)
Alkaline Phosphatase: 37 U/L — ABNORMAL LOW (ref 38–126)
Bilirubin, Direct: <0.1 mg/dL — ABNORMAL LOW (ref 0.1–0.5)
Total Bilirubin: 0.2 mg/dL — ABNORMAL LOW (ref 0.3–1.2)
Total Protein: 5.1 g/dL — ABNORMAL LOW (ref 6.5–8.1)

## 2017-03-18 LAB — GLUCOSE, CAPILLARY
Glucose-Capillary: 131 mg/dL — ABNORMAL HIGH (ref 65–99)
Glucose-Capillary: 136 mg/dL — ABNORMAL HIGH (ref 65–99)
Glucose-Capillary: 133 mg/dL — ABNORMAL HIGH (ref 65–99)
Glucose-Capillary: 183 mg/dL — ABNORMAL HIGH (ref 65–99)
Glucose-Capillary: 165 mg/dL — ABNORMAL HIGH (ref 65–99)

## 2017-03-18 SURGERY — RIGHT HEART CATH
Anesthesia: LOCAL

## 2017-03-18 MED ORDER — ACETAMINOPHEN 325 MG PO TABS: 650.0000 mg | ORAL_TABLET | ORAL | Status: DC | PRN

## 2017-03-18 MED ORDER — HEPARIN (PORCINE) IN NACL 2-0.9 UNIT/ML-% IJ SOLN
INTRAMUSCULAR | Status: AC
  Filled 2017-03-18: qty 500

## 2017-03-18 MED ORDER — FENTANYL CITRATE (PF) 100 MCG/2ML IJ SOLN
INTRAMUSCULAR | Status: AC
  Filled 2017-03-18: qty 2

## 2017-03-18 MED ORDER — HEPARIN SODIUM (PORCINE) 5000 UNIT/ML IJ SOLN
5000.0000 [IU] | Freq: Three times a day (TID) | INTRAMUSCULAR | Status: DC
  Administered 2017-03-18 – 2017-03-21 (×9): 5000 [IU] via SUBCUTANEOUS
  Filled 2017-03-18 (×9): qty 1

## 2017-03-18 MED ORDER — ONDANSETRON HCL 4 MG/2ML IJ SOLN: 4.0000 mg | Freq: Four times a day (QID) | INTRAMUSCULAR | Status: DC | PRN

## 2017-03-18 MED ORDER — LIDOCAINE HCL 1 % IJ SOLN
INTRAMUSCULAR | Status: AC
  Filled 2017-03-18: qty 20

## 2017-03-18 MED ORDER — SODIUM CHLORIDE 0.9% FLUSH
3.0000 mL | Freq: Two times a day (BID) | INTRAVENOUS | Status: DC
  Administered 2017-03-21: 3 mL via INTRAVENOUS

## 2017-03-18 MED ORDER — LIDOCAINE HCL (PF) 1 % IJ SOLN
INTRAMUSCULAR | Status: DC | PRN
  Administered 2017-03-18: 15 mL

## 2017-03-18 MED ORDER — DEXTROSE 5 % IV SOLN
1.0000 g | INTRAVENOUS | Status: DC
  Administered 2017-03-19: 1 g via INTRAVENOUS
  Filled 2017-03-18: qty 10

## 2017-03-18 MED ORDER — FENTANYL CITRATE (PF) 100 MCG/2ML IJ SOLN
INTRAMUSCULAR | Status: DC | PRN
  Administered 2017-03-18: 25 ug via INTRAVENOUS

## 2017-03-18 MED ORDER — SODIUM CHLORIDE 0.9 % IV SOLN
250.0000 mL | INTRAVENOUS | Status: DC | PRN
  Administered 2017-03-22: 12:00:00 via INTRAVENOUS

## 2017-03-18 MED ORDER — MIDAZOLAM HCL 2 MG/2ML IJ SOLN
INTRAMUSCULAR | Status: DC | PRN
  Administered 2017-03-18: 1 mg via INTRAVENOUS

## 2017-03-18 MED ORDER — SODIUM CHLORIDE 0.9% FLUSH: 3.0000 mL | INTRAVENOUS | Status: DC | PRN

## 2017-03-18 MED ORDER — HEPARIN (PORCINE) IN NACL 2-0.9 UNIT/ML-% IJ SOLN
INTRAMUSCULAR | Status: DC | PRN
  Administered 2017-03-18: 1000 mL

## 2017-03-18 MED ORDER — MIDAZOLAM HCL 2 MG/2ML IJ SOLN
INTRAMUSCULAR | Status: AC
  Filled 2017-03-18: qty 2

## 2017-03-18 SURGICAL SUPPLY — 9 items
CATH SWAN GANZ 7F STRAIGHT (CATHETERS) ×2 IMPLANT
PACK CARDIAC CATHETERIZATION (CUSTOM PROCEDURE TRAY) ×2 IMPLANT
PROTECTION STATION PRESSURIZED (MISCELLANEOUS) ×2
SHEATH GLIDE SLENDER 4/5FR (SHEATH) ×2 IMPLANT
SHEATH PINNACLE 7F 10CM (SHEATH) ×2 IMPLANT
STATION PROTECTION PRESSURIZED (MISCELLANEOUS) ×1 IMPLANT
TRANSDUCER W/STOPCOCK (MISCELLANEOUS) ×2 IMPLANT
TUBING ART PRESS 72  MALE/FEM (TUBING) ×1
TUBING ART PRESS 72 MALE/FEM (TUBING) ×1 IMPLANT

## 2017-03-18 NOTE — Progress Notes (Signed)
Site area: Right groin a 7 french venous sheath was removed  Site Prior to Removal:  Level 0  Pressure Applied For 15 MINUTES    Bedrest Beginning at 1340p  Manual:   Yes.    Patient Status During Pull:  stable  Post Pull Groin Site:  Level 0  Post Pull Instructions Given:  Yes.    Post Pull Pulses Present:  Yes.    Dressing Applied:  Yes.    Comments:  VS remain stable during sheath pull.

## 2017-03-18 NOTE — H&P (View-Only) (Signed)
Progress Note  Patient Name: Douglas Cannon Date of Encounter: 03/18/2017  Primary Cardiologist: Diona Browner  Subjective   No angina. Breathing continues to improve.Generally feels well.  Cardiac MRI shows extensive areas of severe hypokinesis with no evidence of scar. There is mild nontransmural scar in the distribution of the LAD with more significant scar in the apex. Most areas appear viable, except for the apex Chest x-ray shows improvement in pulmonary edema, but residual infiltrate in the right upper lobe suggestive of pneumonia. Creatinine is essentially unchanged at 1.83. Hemoglobin 9.1. Oxygen saturation remains imperfect (93% on 3 L, 90% on room air). Currently going for pre-CABG Dopplers, then CT of chest. Scheduled for right heart catheterization today with Dr. Shirlee Latch  Inpatient Medications    Scheduled Meds: . aspirin  81 mg Oral Daily  . carvedilol  3.125 mg Oral BID WC  . chlorhexidine  15 mL Mouth Rinse BID  . citalopram  20 mg Oral Daily  . enoxaparin (LOVENOX) injection  40 mg Subcutaneous Q24H  . furosemide  40 mg Oral BID  . insulin aspart  0-9 Units Subcutaneous TID WC  . mouth rinse  15 mL Mouth Rinse q12n4p  . potassium chloride  40 mEq Oral Daily  . rosuvastatin  5 mg Oral QHS  . sodium chloride flush  3 mL Intravenous Q12H  . sodium chloride flush  3 mL Intravenous Q12H  . sodium chloride flush  3 mL Intravenous Q12H   Continuous Infusions: . sodium chloride    . sodium chloride    . sodium chloride    . sodium chloride 10 mL/hr at 03/18/17 0554   PRN Meds: sodium chloride, sodium chloride, sodium chloride, acetaminophen, diazepam, hydrALAZINE, ipratropium-albuterol, levalbuterol, ondansetron (ZOFRAN) IV, sodium chloride flush, sodium chloride flush, sodium chloride flush   Vital Signs    Vitals:   03/18/17 0019 03/18/17 0507 03/18/17 0509 03/18/17 0808  BP: 112/67 96/82 134/79 132/90  Pulse: 71 68  74  Resp: (!) 22 13  15   Temp: 98.5 F (36.9  C) 98.5 F (36.9 C)  98.2 F (36.8 C)  TempSrc: Oral Oral  Oral  SpO2: 96% 96%  100%  Weight:   78.7 kg (173 lb 9.6 oz)   Height:        Intake/Output Summary (Last 24 hours) at 03/18/17 0938 Last data filed at 03/18/17 0600  Gross per 24 hour  Intake              366 ml  Output             1075 ml  Net             -709 ml   Filed Weights   03/16/17 0443 03/17/17 0531 03/18/17 0509  Weight: 79.9 kg (176 lb 3.2 oz) 80.1 kg (176 lb 8 oz) 78.7 kg (173 lb 9.6 oz)    Telemetry    NSR - Personally Reviewed  Physical Exam  Calm, relaxed GEN: No acute distress.   Neck: No JVD Cardiac: RRR, no murmurs, rubs, or gallops.  Respiratory: Clear to auscultation bilaterally. GI: Soft, nontender, non-distended  MS: No edema; No deformity. Neuro:  Nonfocal  Psych: Normal affect   Labs    Chemistry Recent Labs Lab 03/12/17 0343  03/16/17 0746 03/17/17 0237 03/18/17 0345  NA 138  < > 138 137 138  K 3.8  < > 3.7 3.5 3.9  CL 106  < > 100* 101 101  CO2 22  < >  26 26 28   GLUCOSE 177*  < > 143* 158* 140*  BUN 70*  < > 33* 35* 31*  CREATININE 2.82*  < > 1.78* 1.91* 1.83*  CALCIUM 8.2*  < > 8.4* 8.2* 8.3*  PROT 5.4*  --   --   --  5.1*  ALBUMIN 2.4*  --   --   --  2.1*  AST 78*  --   --   --  16  ALT 16*  --   --   --  10*  ALKPHOS 37*  --   --   --  37*  BILITOT 0.2*  --   --   --  0.2*  GFRNONAA 23*  < > 40* 37* 39*  GFRAA 27*  < > 46* 43* 45*  ANIONGAP 10  < > 12 10 9   < > = values in this interval not displayed.   Hematology Recent Labs Lab 03/16/17 0746 03/17/17 0237 03/18/17 0345  WBC 14.1* 11.5* 10.8*  RBC 3.35* 3.03* 3.07*  HGB 9.8* 9.1* 9.1*  HCT 29.9* 27.0* 27.4*  MCV 89.3 89.1 89.3  MCH 29.3 30.0 29.6  MCHC 32.8 33.7 33.2  RDW 13.0 13.4 13.3  PLT 295 275 265    Cardiac Enzymes Recent Labs Lab 03/11/17 1543 03/11/17 2050 03/12/17 0343 03/13/17 0506  TROPONINI 16.91* 29.89* 34.91* 18.72*   No results for input(s): TROPIPOC in the last 168 hours.    BNP Recent Labs Lab 03/17/17 0815  BNP 2,172.1*     DDimer  Recent Labs Lab 03/11/17 1753  DDIMER 0.48     Radiology    Dg Chest 2 View  Result Date: 03/18/2017 CLINICAL DATA:  Shortness of Breath EXAM: CHEST  2 VIEW COMPARISON:  March 14, 2017 FINDINGS: There is patchy airspace consolidation with volume loss in the posterior segment of the right upper lobe. There is mild perihilar interstitial edema bilaterally. Heart is upper normal in size with pulmonary vascularity within normal limits. No adenopathy. No bone lesions. IMPRESSION: Patchy airspace opacity with volume loss posterior segment right upper lobe. Suspect pneumonia in this area. There is mild perihilar interstitial edema. Stable cardiac silhouette with heart upper normal in size. Electronically Signed   By: Bretta BangWilliam  Woodruff III M.D.   On: 03/18/2017 07:59   Mr Cardiac Morphology W Wo Contrast  Result Date: 03/17/2017 CLINICAL DATA:  Ischemic cardiomyopathy, assess for viability. EXAM: CARDIAC MRI TECHNIQUE: The patient was scanned on a 1.5 Tesla GE magnet. A dedicated cardiac coil was used. Functional imaging was done using Fiesta sequences. 2,3, and 4 chamber views were done to assess for RWMA's. Modified Simpson's rule using a short axis stack was used to calculate an ejection fraction on a dedicated work Research officer, trade unionstation using Circle software. The patient received 28 cc of Multihance. After 10 minutes inversion recovery sequences were used to assess for infiltration and scar tissue. CONTRAST:  28 cc Multihance contrast. FINDINGS: There were small to moderate bilateral pleural effusions. There was a small, primarily inferior pericardial effusion. Normal left ventricular size and wall thickness. Mid to apical anteroseptal severe hypokinesis, severe hypokinesis of the apical lateral wall, severe hypokinesis of the mid anterior wall with akinesis of the apical anterior wall, inferolateral hypokinesis, severe hypokinesis of the true apex.  Normal right ventricular size and systolic function. Normal right and left atrial sizes. No significant mitral regurgitation noted. Trileaflet aortic valve with no stenosis or regurgitation. Delayed enhancement imaging: 26-50% wall thickness subendocardial late gadolinium enhancement (LGE) in the  mid anterior and apical anterior walls. 50% wall thickness subendocardial LGE at the true apex. Measurements: LVEDV 188 mL LVSV 64 mL LVEF 34% IMPRESSION: 1.  Small to moderate bilateral pleural effusions. 2. Normal LV size with wall motion abnormalities as noted above. EF 34%. 3.  Normal RV size and systolic function. 4. Delayed enhancement pattern suggests that hypokinetic segments of the LV have a good chance of improving with revascularization (all segments would be expected to be viable except for the true apex which is questionable). Dalton Mclean Electronically Signed   By: Marca Ancona M.D.   On: 03/17/2017 23:51    Cardiac Studies   Extensive myocardial viability per MRI, as above  Patient Profile     61 y.o. male with diabetes mellitus and stage IIIC [place], presenting with new onset left heart failure and pulmonary edema, decompensation likely triggered by right upper lobe pneumonia, found to have severe multivessel CAD and severely depressed left ventricular systolic function.  Assessment & Plan    1. CAD - severe multivessel CAD, Tentative plan for CABG early next week, after further improvement in pulmonary status and completion of workup.   2. Acute on chronic stage 3 renal failure: baseline creatinine probably 1.4-1.7, but has been >2 as recently as Feb 2017. Seems to be stable at around 1.8 on this admission.  3. CHF: Right heart cath today to assess for adequacy of diuresis. Suspect residual hypoxemia is related to right upper lobe infectious process  4. DM: most recent A1c excellent at 6.3%. Managed at home on oral agents. SSI while here.   5. HTN: good control without meds right  now. Start low-dose carvedilol. Hold off renin-angiotensin inhibitors until after bypass surgery. Long-term, he is an excellent Entresto candidate, if EF remains under 40%.  Signed, Thurmon Fair, MD  03/18/2017, 9:38 AM

## 2017-03-18 NOTE — Progress Notes (Signed)
Day of Surgery Procedure(s) (LRB): Right Heart Cath (N/A) Subjective: Cardiac MRI viability study reviewed- favorable RHC data today shows improved filling pressures and compensated cardiac output  Objective: Vital signs in last 24 hours: Temp:  [98.1 F (36.7 C)-98.6 F (37 C)] 98.1 F (36.7 C) (05/04 1135) Pulse Rate:  [66-109] 68 (05/04 1343) Cardiac Rhythm: Normal sinus rhythm (05/04 1315) Resp:  [0-26] 13 (05/04 1343) BP: (96-169)/(67-105) 140/80 (05/04 1340) SpO2:  [87 %-100 %] 94 % (05/04 1343) Weight:  [173 lb 9.6 oz (78.7 kg)] 173 lb 9.6 oz (78.7 kg) (05/04 0509)  Hemodynamic parameters for last 24 hours:  remains on O2 [place] -  dependent  Intake/Output from previous day: 05/03 0701 - 05/04 0700 In: 366 [P.O.:265; I.V.:1; IV Piggyback:100] Out: 1075 [Urine:1075] Intake/Output this shift: No intake/output data recorded.       Exam    General- alert and comfortable   Lungs- clear without rales, wheezes   Cor- regular rate and rhythm, no murmur , gallop   Abdomen- soft, non-tender   Extremities - warm, non-tender, minimal edema   Neuro- oriented, appropriate, no focal weakness    Lab Results:  Recent Labs  03/17/17 0237 03/18/17 0345  WBC 11.5* 10.8*  HGB 9.1* 9.1*  HCT 27.0* 27.4*  PLT 275 265   BMET:  Recent Labs  03/17/17 0237 03/18/17 0345  NA 137 138  K 3.5 3.9  CL 101 101  CO2 26 28  GLUCOSE 158* 140*  BUN 35* 31*  CREATININE 1.91* 1.83*  CALCIUM 8.2* 8.3*    PT/INR: No results for input(s): LABPROT, INR in the last 72 hours. ABG    Component Value Date/Time   PHART 7.423 03/11/2017 1115   HCO3 19.3 (L) 03/11/2017 1115   TCO2 18 03/11/2017 0922   ACIDBASEDEF 6.7 (H) 03/11/2017 1115   O2SAT 85.3 03/11/2017 1115   CBG (last 3)   Recent Labs  03/18/17 0807 03/18/17 1134 03/18/17 1327  GLUCAP 131* 136* 133*    Assessment/Plan: S/P Procedure(s) (LRB): Right Heart Cath (N/A) Cont ceftriaxone- CT chest shows RUL  pneumonia CABG tues 5-8  LOS: 7 days    Kathlee Nationseter Van Trigt III 03/18/2017

## 2017-03-18 NOTE — Progress Notes (Signed)
CARDIAC REHAB PHASE I   PRE:  Rate/Rhythm: 78 SR    BP: sitting 152/86    SaO2: 100 2L, 93 RA  MODE:  Ambulation: 850 ft   POST:  Rate/Rhythm: 86 SR    BP: sitting 162/94     SaO2: 96-97 RA  Tolerated very well, no c/o, feels well. SaO2 upper 90s with walking, left O2 off. He apparently needs O2 when he sleeps. Encouraged more walking over weekend. We will f/u as low priority. [id number]   Douglas Cannon CES, ACSM 03/18/2017 11:53 AM

## 2017-03-18 NOTE — Progress Notes (Signed)
Progress Note  Patient Name: Douglas Cannon Date of Encounter: 03/18/2017  Primary Cardiologist: Diona Browner  Subjective   No angina. Breathing continues to improve.Generally feels well.  Cardiac MRI shows extensive areas of severe hypokinesis with no evidence of scar. There is mild nontransmural scar in the distribution of the LAD with more significant scar in the apex. Most areas appear viable, except for the apex Chest x-ray shows improvement in pulmonary edema, but residual infiltrate in the right upper lobe suggestive of pneumonia. Creatinine is essentially unchanged at 1.83. Hemoglobin 9.1. Oxygen saturation remains imperfect (93% on 3 L, 90% on room air). Currently going for pre-CABG Dopplers, then CT of chest. Scheduled for right heart catheterization today with Dr. Shirlee Latch  Inpatient Medications    Scheduled Meds: . aspirin  81 mg Oral Daily  . carvedilol  3.125 mg Oral BID WC  . chlorhexidine  15 mL Mouth Rinse BID  . citalopram  20 mg Oral Daily  . enoxaparin (LOVENOX) injection  40 mg Subcutaneous Q24H  . furosemide  40 mg Oral BID  . insulin aspart  0-9 Units Subcutaneous TID WC  . mouth rinse  15 mL Mouth Rinse q12n4p  . potassium chloride  40 mEq Oral Daily  . rosuvastatin  5 mg Oral QHS  . sodium chloride flush  3 mL Intravenous Q12H  . sodium chloride flush  3 mL Intravenous Q12H  . sodium chloride flush  3 mL Intravenous Q12H   Continuous Infusions: . sodium chloride    . sodium chloride    . sodium chloride    . sodium chloride 10 mL/hr at 03/18/17 0554   PRN Meds: sodium chloride, sodium chloride, sodium chloride, acetaminophen, diazepam, hydrALAZINE, ipratropium-albuterol, levalbuterol, ondansetron (ZOFRAN) IV, sodium chloride flush, sodium chloride flush, sodium chloride flush   Vital Signs    Vitals:   03/18/17 0019 03/18/17 0507 03/18/17 0509 03/18/17 0808  BP: 112/67 96/82 134/79 132/90  Pulse: 71 68  74  Resp: (!) 22 13  15   Temp: 98.5 F (36.9  C) 98.5 F (36.9 C)  98.2 F (36.8 C)  TempSrc: Oral Oral  Oral  SpO2: 96% 96%  100%  Weight:   78.7 kg (173 lb 9.6 oz)   Height:        Intake/Output Summary (Last 24 hours) at 03/18/17 0938 Last data filed at 03/18/17 0600  Gross per 24 hour  Intake              366 ml  Output             1075 ml  Net             -709 ml   Filed Weights   03/16/17 0443 03/17/17 0531 03/18/17 0509  Weight: 79.9 kg (176 lb 3.2 oz) 80.1 kg (176 lb 8 oz) 78.7 kg (173 lb 9.6 oz)    Telemetry    NSR - Personally Reviewed  Physical Exam  Calm, relaxed GEN: No acute distress.   Neck: No JVD Cardiac: RRR, no murmurs, rubs, or gallops.  Respiratory: Clear to auscultation bilaterally. GI: Soft, nontender, non-distended  MS: No edema; No deformity. Neuro:  Nonfocal  Psych: Normal affect   Labs    Chemistry Recent Labs Lab 03/12/17 0343  03/16/17 0746 03/17/17 0237 03/18/17 0345  NA 138  < > 138 137 138  K 3.8  < > 3.7 3.5 3.9  CL 106  < > 100* 101 101  CO2 22  < >  26 26 28   GLUCOSE 177*  < > 143* 158* 140*  BUN 70*  < > 33* 35* 31*  CREATININE 2.82*  < > 1.78* 1.91* 1.83*  CALCIUM 8.2*  < > 8.4* 8.2* 8.3*  PROT 5.4*  --   --   --  5.1*  ALBUMIN 2.4*  --   --   --  2.1*  AST 78*  --   --   --  16  ALT 16*  --   --   --  10*  ALKPHOS 37*  --   --   --  37*  BILITOT 0.2*  --   --   --  0.2*  GFRNONAA 23*  < > 40* 37* 39*  GFRAA 27*  < > 46* 43* 45*  ANIONGAP 10  < > 12 10 9   < > = values in this interval not displayed.   Hematology Recent Labs Lab 03/16/17 0746 03/17/17 0237 03/18/17 0345  WBC 14.1* 11.5* 10.8*  RBC 3.35* 3.03* 3.07*  HGB 9.8* 9.1* 9.1*  HCT 29.9* 27.0* 27.4*  MCV 89.3 89.1 89.3  MCH 29.3 30.0 29.6  MCHC 32.8 33.7 33.2  RDW 13.0 13.4 13.3  PLT 295 275 265    Cardiac Enzymes Recent Labs Lab 03/11/17 1543 03/11/17 2050 03/12/17 0343 03/13/17 0506  TROPONINI 16.91* 29.89* 34.91* 18.72*   No results for input(s): TROPIPOC in the last 168 hours.    BNP Recent Labs Lab 03/17/17 0815  BNP 2,172.1*     DDimer  Recent Labs Lab 03/11/17 1753  DDIMER 0.48     Radiology    Dg Chest 2 View  Result Date: 03/18/2017 CLINICAL DATA:  Shortness of Breath EXAM: CHEST  2 VIEW COMPARISON:  March 14, 2017 FINDINGS: There is patchy airspace consolidation with volume loss in the posterior segment of the right upper lobe. There is mild perihilar interstitial edema bilaterally. Heart is upper normal in size with pulmonary vascularity within normal limits. No adenopathy. No bone lesions. IMPRESSION: Patchy airspace opacity with volume loss posterior segment right upper lobe. Suspect pneumonia in this area. There is mild perihilar interstitial edema. Stable cardiac silhouette with heart upper normal in size. Electronically Signed   By: Bretta BangWilliam  Woodruff III M.D.   On: 03/18/2017 07:59   Mr Cardiac Morphology W Wo Contrast  Result Date: 03/17/2017 CLINICAL DATA:  Ischemic cardiomyopathy, assess for viability. EXAM: CARDIAC MRI TECHNIQUE: The patient was scanned on a 1.5 Tesla GE magnet. A dedicated cardiac coil was used. Functional imaging was done using Fiesta sequences. 2,3, and 4 chamber views were done to assess for RWMA's. Modified Simpson's rule using a short axis stack was used to calculate an ejection fraction on a dedicated work Research officer, trade unionstation using Circle software. The patient received 28 cc of Multihance. After 10 minutes inversion recovery sequences were used to assess for infiltration and scar tissue. CONTRAST:  28 cc Multihance contrast. FINDINGS: There were small to moderate bilateral pleural effusions. There was a small, primarily inferior pericardial effusion. Normal left ventricular size and wall thickness. Mid to apical anteroseptal severe hypokinesis, severe hypokinesis of the apical lateral wall, severe hypokinesis of the mid anterior wall with akinesis of the apical anterior wall, inferolateral hypokinesis, severe hypokinesis of the true apex.  Normal right ventricular size and systolic function. Normal right and left atrial sizes. No significant mitral regurgitation noted. Trileaflet aortic valve with no stenosis or regurgitation. Delayed enhancement imaging: 26-50% wall thickness subendocardial late gadolinium enhancement (LGE) in the  mid anterior and apical anterior walls. 50% wall thickness subendocardial LGE at the true apex. Measurements: LVEDV 188 mL LVSV 64 mL LVEF 34% IMPRESSION: 1.  Small to moderate bilateral pleural effusions. 2. Normal LV size with wall motion abnormalities as noted above. EF 34%. 3.  Normal RV size and systolic function. 4. Delayed enhancement pattern suggests that hypokinetic segments of the LV have a good chance of improving with revascularization (all segments would be expected to be viable except for the true apex which is questionable). Douglas Cannon Electronically Signed   By: Marca Ancona M.D.   On: 03/17/2017 23:51    Cardiac Studies   Extensive myocardial viability per MRI, as above  Patient Profile     61 y.o. male with diabetes mellitus and stage IIIC Cady, presenting with new onset left heart failure and pulmonary edema, decompensation likely triggered by right upper lobe pneumonia, found to have severe multivessel CAD and severely depressed left ventricular systolic function.  Assessment & Plan    1. CAD - severe multivessel CAD, Tentative plan for CABG early next week, after further improvement in pulmonary status and completion of workup.   2. Acute on chronic stage 3 renal failure: baseline creatinine probably 1.4-1.7, but has been >2 as recently as Feb 2017. Seems to be stable at around 1.8 on this admission.  3. CHF: Right heart cath today to assess for adequacy of diuresis. Suspect residual hypoxemia is related to right upper lobe infectious process  4. DM: most recent A1c excellent at 6.3%. Managed at home on oral agents. SSI while here.   5. HTN: good control without meds right  now. Start low-dose carvedilol. Hold off renin-angiotensin inhibitors until after bypass surgery. Long-term, he is an excellent Entresto candidate, if EF remains under 40%.  Signed, Thurmon Fair, MD  03/18/2017, 9:38 AM

## 2017-03-18 NOTE — Progress Notes (Signed)
PROGRESS NOTE    Douglas Cannon  ZOX:[id number] DOB: 09-30-1956 DOA: 03/11/2017 PCP: Dwana Melena, MD   Brief Narrative: 61 y.o. male with medical history significant of HTN, HLD, DM Type 2, BPH, CKD Stage 3, Anemia, Anxiety, and ?Diastolic HF with other comorbids who presented to the [facility] ED with worsening shortness of breath and leg swelling for about 2 days. On admission patient was found to have elevated troponin, BNP concerning for CHF and possible pneumonia. Patient was started on heparin drip, IV antibiotics, nitro drip and transferred to [facility] for further evaluation. Evaluated by cardiologist and pulmonary critical care team.  Assessment & Plan:  #Acute hypoxic respiratory failure due to acute CHF/pulm edema and viral illness-rhinovirus -OFF BIPAP, Hypoxia improving with diuresis -diuresed with IV lasix, negative 6L, change to PO lasix -Respiratory viral studies positive for rhinovirus. -also on Abx for pneumonia, off Azithromycin and completed 7days of ceftriaxone, off Abx now -wean O2, ambulate, incentive spirometry  #  NSTEMI: Peak troponin level 34.9. - OFF IV heparin and nitro gtt now - s/p Left  Heart cath 5/1 with multivessel CAD, CVTS consult for CABG - volume status improving, lasix changed to Po - plan for R heart cath -Continue aspirin, statin. -start BB in next 1-2days  #Acute systolic congestive heart failure.  -ischemic cardiomyopathy -Echo showed EF of 30-35%  -diuresed with IV lasix, creatinine trending up, lasix changed to PO -s/p Cardiac MRI and R heart cath  #Chronic DVT -chronic deep vein thrombosis involving a minute branch of the left distal posterior tibial vein of   the left lower extremity, does not need any RX for this, called and d/w with [facility] Dr.Fields  #Acute on chronic kidney disease stage III: -baseline serum creatinine level around 1.5-2. -Ultrasound of kidneys with medical renal disease.  -Serum creatinine fluctuating with  diuresis, now 1.9, diuretics changed to PO -Avoid nephrotoxins, monitor Bmet  #Hypokalemia in the setting of diuretics: - Improved. Change from twice a day to daily dose.   #Hyperlipidemia: Continue statin  #Type 2 diabetes: - Continue sliding scale.  - Hba1c 6.3  DVT prophylaxis: Lovenox Code Status: Full code Family Communication: None at bedside Disposition Plan: Home after CABG likely   Consultants:   Cardiologist  Pulmonary critical care  Procedures: Noninvasive ventilation Antimicrobials: Azithromycin and ceftriaxone since 4/27  Subjective: Breathing much improved  Objective: Vitals:   03/18/17 1330 03/18/17 1335 03/18/17 1340 03/18/17 1343  BP: (!) 148/81 (!) 146/85 140/80   Pulse: 69 72 69 68  Resp: 17 17 19 13   Temp:      TempSrc:      SpO2: 93% 94% 91% 94%  Weight:      Height:        Intake/Output Summary (Last 24 hours) at 03/18/17 1426 Last data filed at 03/18/17 0600  Gross per 24 hour  Intake              366 ml  Output              575 ml  Net             -209 ml   Filed Weights   03/16/17 0443 03/17/17 0531 03/18/17 0509  Weight: 79.9 kg (176 lb 3.2 oz) 80.1 kg (176 lb 8 oz) 78.7 kg (173 lb 9.6 oz)    Examination:  General exam: AAOX3, no distress Respiratory system: CTAB Cardiovascular system: Regular rate and rhythm, S1 and S2 normal Gastrointestinal system: soft ,  NT, BS present Central nervous system: Alert awake and non focal Extremities: no edema Skin: no rashes  Data Reviewed: I have personally reviewed following labs and imaging studies  CBC:  Recent Labs Lab 03/12/17 0343  03/14/17 0313 03/15/17 0420 03/16/17 0746 03/17/17 0237 03/18/17 0345  WBC 15.8*  < > 10.5 12.3* 14.1* 11.5* 10.8*  NEUTROABS 13.3*  --   --   --   --   --   --   HGB 9.7*  < > 8.6* 9.3* 9.8* 9.1* 9.1*  HCT 29.4*  < > 24.8* 28.0* 29.9* 27.0* 27.4*  MCV 89.1  < > 89.2 89.2 89.3 89.1 89.3  PLT 212  < > 188 251 295 275 265  < > = values in  this interval not displayed. Basic Metabolic Panel:  Recent Labs Lab 03/12/17 0343  03/14/17 0313 03/15/17 0420 03/16/17 0746 03/17/17 0237 03/18/17 0345  NA 138  < > 137 139 138 137 138  K 3.8  < > 3.2* 4.1 3.7 3.5 3.9  CL 106  < > 104 106 100* 101 101  CO2 22  < > 24 26 26 26 28   GLUCOSE 177*  < > 116* 149* 143* 158* 140*  BUN 70*  < > 43* 37* 33* 35* 31*  CREATININE 2.82*  < > 2.08* 1.90* 1.78* 1.91* 1.83*  CALCIUM 8.2*  < > 7.7* 8.1* 8.4* 8.2* 8.3*  MG 2.0  --  1.8  --   --   --   --   PHOS 4.3  --   --   --   --   --   --   < > = values in this interval not displayed. GFR: Estimated Creatinine Clearance: 44.9 mL/min (A) (by C-G formula based on SCr of 1.83 mg/dL (H)). Liver Function Tests:  Recent Labs Lab 03/12/17 0343 03/18/17 0345  AST 78* 16  ALT 16* 10*  ALKPHOS 37* 37*  BILITOT 0.2* 0.2*  PROT 5.4* 5.1*  ALBUMIN 2.4* 2.1*   No results for input(s): LIPASE, AMYLASE in the last 168 hours. No results for input(s): AMMONIA in the last 168 hours. Coagulation Profile:  Recent Labs Lab 03/15/17 0420  INR 1.15   Cardiac Enzymes:  Recent Labs Lab 03/11/17 1543 03/11/17 2050 03/12/17 0343 03/13/17 0506  TROPONINI 16.91* 29.89* 34.91* 18.72*   BNP (last 3 results) No results for input(s): PROBNP in the last 8760 hours. HbA1C: No results for input(s): HGBA1C in the last 72 hours. CBG:  Recent Labs Lab 03/17/17 1615 03/17/17 2142 03/18/17 0807 03/18/17 1134 03/18/17 1327  GLUCAP 82 154* 131* 136* 133*   Lipid Profile: No results for input(s): CHOL, HDL, LDLCALC, TRIG, CHOLHDL, LDLDIRECT in the last 72 hours. Thyroid Function Tests: No results for input(s): TSH, T4TOTAL, FREET4, T3FREE, THYROIDAB in the last 72 hours. Anemia Panel: No results for input(s): VITAMINB12, FOLATE, FERRITIN, TIBC, IRON, RETICCTPCT in the last 72 hours. Sepsis Labs:  Recent Labs Lab 03/11/17 1753 03/13/17 0506 03/15/17 0420  PROCALCITON 2.27 0.87 0.27    LATICACIDVEN 1.9  --   --     Recent Results (from the past 240 hour(s))  Culture, blood (routine x 2)     Status: None   Collection Time: 03/11/17  9:20 AM  Result Value Ref Range Status   Specimen Description BLOOD LEFT ARM  Final   Special Requests   Final    BOTTLES DRAWN AEROBIC AND ANAEROBIC Blood Culture results may not be optimal due to  an inadequate volume of blood received in culture bottles   Culture NO GROWTH 5 DAYS  Final   Report Status 03/16/2017 FINAL  Final  Culture, blood (routine x 2)     Status: None   Collection Time: 03/11/17 10:22 AM  Result Value Ref Range Status   Specimen Description BLOOD LEFT HAND  Final   Special Requests   Final    BOTTLES DRAWN AEROBIC AND ANAEROBIC Blood Culture results may not be optimal due to an inadequate volume of blood received in culture bottles   Culture NO GROWTH 5 DAYS  Final   Report Status 03/16/2017 FINAL  Final  Respiratory Panel by PCR     Status: Abnormal   Collection Time: 03/11/17  9:31 PM  Result Value Ref Range Status   Adenovirus NOT DETECTED NOT DETECTED Final   Coronavirus 229E NOT DETECTED NOT DETECTED Final   Coronavirus HKU1 NOT DETECTED NOT DETECTED Final   Coronavirus NL63 NOT DETECTED NOT DETECTED Final   Coronavirus OC43 NOT DETECTED NOT DETECTED Final   Metapneumovirus NOT DETECTED NOT DETECTED Final   Rhinovirus / Enterovirus DETECTED (A) NOT DETECTED Final   Influenza A NOT DETECTED NOT DETECTED Final   Influenza B NOT DETECTED NOT DETECTED Final   Parainfluenza Virus 1 NOT DETECTED NOT DETECTED Final   Parainfluenza Virus 2 NOT DETECTED NOT DETECTED Final   Parainfluenza Virus 3 NOT DETECTED NOT DETECTED Final   Parainfluenza Virus 4 NOT DETECTED NOT DETECTED Final   Respiratory Syncytial Virus NOT DETECTED NOT DETECTED Final   Bordetella pertussis NOT DETECTED NOT DETECTED Final   Chlamydophila pneumoniae NOT DETECTED NOT DETECTED Final   Mycoplasma pneumoniae NOT DETECTED NOT DETECTED Final   MRSA PCR Screening     Status: None   Collection Time: 03/11/17  9:31 PM  Result Value Ref Range Status   MRSA by PCR NEGATIVE NEGATIVE Final    Comment:        The GeneXpert MRSA Assay (FDA approved for NASAL specimens only), is one component of a comprehensive MRSA colonization surveillance program. It is not intended to diagnose MRSA infection nor to guide or monitor treatment for MRSA infections.   Surgical pcr screen     Status: None   Collection Time: 03/17/17 10:42 AM  Result Value Ref Range Status   MRSA, PCR NEGATIVE NEGATIVE Final   Staphylococcus aureus NEGATIVE NEGATIVE Final    Comment:        The Xpert SA Assay (FDA approved for NASAL specimens in patients over 29 years of age), is one component of a comprehensive surveillance program.  Test performance has been validated by [facility] for patients greater than or equal to 71 year old. It is not intended to diagnose infection nor to guide or monitor treatment.          Radiology Studies: Dg Chest 2 View  Result Date: 03/18/2017 CLINICAL DATA:  Shortness of Breath EXAM: CHEST  2 VIEW COMPARISON:  March 14, 2017 FINDINGS: There is patchy airspace consolidation with volume loss in the posterior segment of the right upper lobe. There is mild perihilar interstitial edema bilaterally. Heart is upper normal in size with pulmonary vascularity within normal limits. No adenopathy. No bone lesions. IMPRESSION: Patchy airspace opacity with volume loss posterior segment right upper lobe. Suspect pneumonia in this area. There is mild perihilar interstitial edema. Stable cardiac silhouette with heart upper normal in size. Electronically Signed   By: Bretta Bang III M.D.   On:  03/18/2017 07:59   Ct Chest Wo Contrast  Result Date: 03/18/2017 CLINICAL DATA:  Aortic dilatation. Pt states been having some SOB and is going for CABG next week. EXAM: CT CHEST WITHOUT CONTRAST TECHNIQUE: Multidetector CT imaging of the  chest was performed following the standard protocol without IV contrast. COMPARISON:  Chest radiograph 03/18/2017 FINDINGS: Cardiovascular: Coronary artery calcification and aortic atherosclerotic calcification. No pericardial fluid. Ascending thoracic aorta within normal limits (36 mm). Mediastinum/Nodes: No axillary or supraclavicular adenopathy. No mediastinal hilar adenopathy. Small calcified lymph nodes are in the mediastinum. Esophagus normal. Lungs/Pleura: Bilateral airspace disease within the RIGHT upper lobe and LEFT upper lobe as well as superior segment of the LEFT lower lobe. Airspace disease reaches confluence in the upper lobes. Bilateral pleural effusions which are moderate in volume. Several pulmonary nodules with partial calcification are present. For example RIGHT middle lobe nodule measuring 7 mm (image 106, series 8). Similar nodule in the posterior RIGHT lower lobe measuring 9 mm on image 104, series 3. Upper Abdomen: Limited view of the liver, kidneys, pancreas are unremarkable. Normal adrenal glands. Musculoskeletal: No aggressive osseous lesion. IMPRESSION: 1. Bilateral multifocal pneumonia within the upper lobes. 2. Moderate bilateral pleural effusions. 3. Evidence of prior granulomatous disease with calcified mediastinal lymph nodes and partially calcified pulmonary nodules. These results will be called to the ordering clinician or representative by the Radiologist Assistant, and communication documented in the PACS or zVision Dashboard. Electronically Signed   By: Genevive Bi M.D.   On: 03/18/2017 10:51   Mr Cardiac Morphology W Wo Contrast  Result Date: 03/17/2017 CLINICAL DATA:  Ischemic cardiomyopathy, assess for viability. EXAM: CARDIAC MRI TECHNIQUE: The patient was scanned on a 1.5 Tesla GE magnet. A dedicated cardiac coil was used. Functional imaging was done using Fiesta sequences. 2,3, and 4 chamber views were done to assess for RWMA's. Modified Simpson's rule using a  short axis stack was used to calculate an ejection fraction on a dedicated work Research officer, trade union. The patient received 28 cc of Multihance. After 10 minutes inversion recovery sequences were used to assess for infiltration and scar tissue. CONTRAST:  28 cc Multihance contrast. FINDINGS: There were small to moderate bilateral pleural effusions. There was a small, primarily inferior pericardial effusion. Normal left ventricular size and wall thickness. Mid to apical anteroseptal severe hypokinesis, severe hypokinesis of the apical lateral wall, severe hypokinesis of the mid anterior wall with akinesis of the apical anterior wall, inferolateral hypokinesis, severe hypokinesis of the true apex. Normal right ventricular size and systolic function. Normal right and left atrial sizes. No significant mitral regurgitation noted. Trileaflet aortic valve with no stenosis or regurgitation. Delayed enhancement imaging: 26-50% wall thickness subendocardial late gadolinium enhancement (LGE) in the mid anterior and apical anterior walls. 50% wall thickness subendocardial LGE at the true apex. Measurements: LVEDV 188 mL LVSV 64 mL LVEF 34% IMPRESSION: 1.  Small to moderate bilateral pleural effusions. 2. Normal LV size with wall motion abnormalities as noted above. EF 34%. 3.  Normal RV size and systolic function. 4. Delayed enhancement pattern suggests that hypokinetic segments of the LV have a good chance of improving with revascularization (all segments would be expected to be viable except for the true apex which is questionable). Dalton Mclean Electronically Signed   By: Marca Ancona M.D.   On: 03/17/2017 23:51        Scheduled Meds: . aspirin  81 mg Oral Daily  . carvedilol  3.125 mg Oral BID WC  .  chlorhexidine  15 mL Mouth Rinse BID  . citalopram  20 mg Oral Daily  . enoxaparin (LOVENOX) injection  40 mg Subcutaneous Q24H  . furosemide  40 mg Oral BID  . heparin  5,000 Units Subcutaneous Q8H  .  insulin aspart  0-9 Units Subcutaneous TID WC  . mouth rinse  15 mL Mouth Rinse q12n4p  . potassium chloride  40 mEq Oral Daily  . rosuvastatin  5 mg Oral QHS  . sodium chloride flush  3 mL Intravenous Q12H  . sodium chloride flush  3 mL Intravenous Q12H  . sodium chloride flush  3 mL Intravenous Q12H   Continuous Infusions: . sodium chloride    . sodium chloride    . sodium chloride       LOS: 7 days    Zannie CoveJOSEPH,Carson Bogden, MD [facility] Pager [phone] If 7PM-7AM, please contact night-coverage [email or web address] Password [email or web address] 03/18/2017, 2:26 PM

## 2017-03-18 NOTE — Progress Notes (Signed)
Right Lower Extremity Vein Map  Right Great Saphenous Vein   Segment Diameter Comment  1. Origin 35mm   2. High Thigh 32mm   3. Mid Thigh 25mm branch  4. Low Thigh 15mm   5. At Knee 25mm   6. High Calf 23mm Thick walls  7. Low Calf 21mm   8. Ankle 20mm    mm    mm    mm     Right Small Saphenous Vein  Segment Diameter Comment  1. Origin 52mm   2. High Calf 32mm Chronic thrombus  3. Low Calf 33mm Chronic thrombus  4. Ankle 23mm    mm    mm    mm    LEFT LOWER EXTREMITY VEIN MAPPING  Segment Diameter Comment  1. Origin 48mm   2. High Thigh 30mm   3. Mid Thigh 17mm   4. Low Thigh 18mm   5. At Knee 17mm   6. High Calf 28mm   7. Low Calf 26mm   8. Ankle 27mm    mm     Left Small Saphenous Vein  Segment Diameter Comment  1. Origin 45mm   2. High Calf 32mm thrombus  3. Low Calf 37mm Thick walls  4. Ankle 25mm    Marilynne Halstedita Eben Choinski, BS, RDMS, RVT

## 2017-03-18 NOTE — Progress Notes (Signed)
Pre-op Cardiac Surgery  Carotid Findings:    Upper Extremity Right Left  Brachial Pressures IV  138  Radial Waveforms Triphasic Triphasic  Ulnar Waveforms Triphasic Triphasic  Palmar Arch (Allen's Test) WNL WNL   Findings:      Lower  Extremity Right Left  Dorsalis Pedis 181 172  Posterior Tibial 183 171  Ankle/Brachial Indices 1.3 1.2    Findings:  Normal triphasic waveforms bilaterally with normal pressures.

## 2017-03-18 NOTE — Interval H&P Note (Signed)
History and Physical Interval Note:  03/18/2017 12:36 PM  Douglas Cannon  has presented today for surgery, with the diagnosis of hf  The various methods of treatment have been discussed with the patient and family. After consideration of risks, benefits and other options for treatment, the patient has consented to  Procedure(s): Right Heart Cath (N/A) as a surgical intervention .  The patient's history has been reviewed, patient examined, no change in status, stable for surgery.  I have reviewed the patient's chart and labs.  Questions were answered to the patient's satisfaction.     Phillipa Morden [employer]

## 2017-03-19 LAB — CBC
HCT: 29.2 % — ABNORMAL LOW (ref 39.0–52.0)
Hemoglobin: 9.3 g/dL — ABNORMAL LOW (ref 13.0–17.0)
MCH: 28.8 pg (ref 26.0–34.0)
MCHC: 31.8 g/dL (ref 30.0–36.0)
MCV: 90.4 fL (ref 78.0–100.0)
Platelets: 341 10*3/uL (ref 150–400)
RBC: 3.23 MIL/uL — ABNORMAL LOW (ref 4.22–5.81)
RDW: 13 % (ref 11.5–15.5)
WBC: 11 10*3/uL — ABNORMAL HIGH (ref 4.0–10.5)

## 2017-03-19 LAB — GLUCOSE, CAPILLARY
Glucose-Capillary: 150 mg/dL — ABNORMAL HIGH (ref 65–99)
Glucose-Capillary: 245 mg/dL — ABNORMAL HIGH (ref 65–99)
Glucose-Capillary: 108 mg/dL — ABNORMAL HIGH (ref 65–99)
Glucose-Capillary: 138 mg/dL — ABNORMAL HIGH (ref 65–99)

## 2017-03-19 LAB — BASIC METABOLIC PANEL
Anion gap: 11 (ref 5–15)
BUN: 33 mg/dL — ABNORMAL HIGH (ref 6–20)
CO2: 29 mmol/L (ref 22–32)
Calcium: 8.4 mg/dL — ABNORMAL LOW (ref 8.9–10.3)
Chloride: 99 mmol/L — ABNORMAL LOW (ref 101–111)
Creatinine, Ser: 1.86 mg/dL — ABNORMAL HIGH (ref 0.61–1.24)
GFR calc Af Amer: 44 mL/min — ABNORMAL LOW (ref >60)
GFR calc non Af Amer: 38 mL/min — ABNORMAL LOW (ref >60)
Glucose, Bld: 154 mg/dL — ABNORMAL HIGH (ref 65–99)
Potassium: 4 mmol/L (ref 3.5–5.1)
Sodium: 139 mmol/L (ref 135–145)

## 2017-03-19 MED ORDER — DEXTROSE 5 % IV SOLN
1.0000 g | INTRAVENOUS | Status: AC
  Administered 2017-03-20 – 2017-03-22 (×3): 1 g via INTRAVENOUS
  Filled 2017-03-19 (×4): qty 10

## 2017-03-19 NOTE — Progress Notes (Signed)
Dr Croitoru's rounding note reviewed, patient is awaiting CABG after being admitted with NSTEMI, has low LVEF. Marland Kitchen. RHC yesterday with normal CI and filling pressures, he is optimized from a fluid standpoint for surgery. Prior echo LVEF 30-35%. Medical therapy with ASA, coreg 3.125mg  bid, crestor 5. No ACE/ARB/aldactone in due to decreased renal function. In general would avoid aggressive medication titration prior to CABG. Bump in Cr this AM,  Hold lasix, particularly given is recent fillng pressuers by RHC looked good. No additioncal cardiology recs at this time   Dominga FerryJ Destin Kittler MD

## 2017-03-19 NOTE — Progress Notes (Signed)
PROGRESS NOTE    Marchia BondDavid Sievers  ZOX:[id number] DOB: 11/25/1955 DOA: 03/11/2017 PCP: Benita StabileHall, John Z, MD   Brief Narrative: 61 y.o. male with medical history significant of HTN, HLD, DM Type 2, BPH, CKD Stage 3, Anemia, Anxiety, and ?Diastolic HF with other comorbids who presented to [facility] ED with worsening shortness of breath and leg swelling for about 2 days. On admission patient was found to have elevated troponin, BNP concerning for CHF and possible pneumonia. Patient was started on heparin drip, IV antibiotics, nitro drip and [facility]. Evaluated by cardiologist and pulmonary critical care team.  Assessment & Plan:  #Acute hypoxic respiratory failure due to acute CHF/pulm edema and viral illness-rhinovirus -OFF BIPAP, Hypoxia improving with diuresis -diuresed with IV lasix, negative 6L, changed to PO lasix -Respiratory viral studies positive for rhinovirus. -also on Abx for pneumonia, off Azithromycin and completed 7days of ceftriaxone, Abx restarted  per Dr.Van Trigt 5/4 -wean O2, ambulate, incentive spirometry -appears euvolemic now, lasix Dced since filling pressures normal now  #  NSTEMI: Peak troponin level 34.9. - OFF IV heparin and nitro gtt now - s/p Left  Heart cath 5/1 with multivessel CAD, CVTS consult for CABG - volume status improving, lasix changed to Po, now held as above - s/p  R heart cath with normal filling pressures -Continue aspirin, statin, coreg  #Acute systolic congestive heart failure.  -ischemic cardiomyopathy -Echo showed EF of 30-35%  -diuresed with IV lasix, creatinine trending up, lasix changed to PO -s/p Cardiac MRI and R heart cath-normal filling pressures  #Chronic DVT -chronic deep vein thrombosis involving a minute branch of the left distal posterior tibial vein of   the left lower extremity, does not need any RX for this, called and d/w with [facility] Dr.Fields  #Acute on chronic kidney disease  stage III: -baseline serum creatinine level around 1.5-2. -Ultrasound of kidneys with medical renal disease.  -Serum creatinine fluctuating with diuresis, now 1.9, diuretics changed to PO -Avoid nephrotoxins, monitor Bmet  #Hypokalemia in the setting of diuretics: - Improved. Change from twice a day to daily dose.   #Hyperlipidemia: Continue statin  #Type 2 diabetes: - Continue sliding scale.  - Hba1c 6.3  DVT prophylaxis: Lovenox Code Status: Full code Family Communication: None at bedside Disposition Plan: Home after CABG likely   Consultants:   Cardiologist  Pulmonary critical care  Procedures: Noninvasive ventilation Antimicrobials: Azithromycin and ceftriaxone since 4/27  Subjective: Breathing much improved,off O2, ambulating  Objective: Vitals:   03/18/17 1343 03/18/17 1717 03/18/17 2118 03/19/17 0402  BP:   120/75 137/86  Pulse: 68 75 71 69  Resp: 13  18 16   Temp:   98.9 F (37.2 C) 97.6 F (36.4 C)  TempSrc:   Oral Oral  SpO2: 94%  97% 99%  Weight:    79.1 kg (174 lb 4.8 oz)  Height:        Intake/Output Summary (Last 24 hours) at 03/19/17 1155 Last data filed at 03/19/17 0410  Gross per 24 hour  Intake              265 ml  Output              525 ml  Net             -260 ml   Filed Weights   03/17/17 0531 03/18/17 0509 03/19/17 0402  Weight: 80.1 kg (176 lb 8 oz) 78.7 kg (173 lb 9.6 oz) 79.1  kg (174 lb 4.8 oz)    Examination:  General exam: AAOX3, pleasant, no distress Respiratory system: clear Cardiovascular system: Regular rate and rhythm, S1 and S2 normal Gastrointestinal system: soft , NT, BS present Central nervous system: Alert awake and non focal Extremities: no edema Skin: no rashes  Data Reviewed: I have personally reviewed following labs and imaging studies  CBC:  Recent Labs Lab 03/16/17 0746 03/17/17 0237 03/18/17 0345 03/18/17 1549 03/19/17 0414  WBC 14.1* 11.5* 10.8* 8.8 11.0*  HGB 9.8* 9.1* 9.1* 9.3* 9.3*  HCT  29.9* 27.0* 27.4* 27.5* 29.2*  MCV 89.3 89.1 89.3 89.9 90.4  PLT 295 275 265 280 341   Basic Metabolic Panel:  Recent Labs Lab 03/14/17 0313 03/15/17 0420 03/16/17 0746 03/17/17 0237 03/18/17 0345 03/18/17 1549 03/19/17 0414  NA 137 139 138 137 138  --  139  K 3.2* 4.1 3.7 3.5 3.9  --  4.0  CL 104 106 100* 101 101  --  99*  CO2 24 26 26 26 28   --  29  GLUCOSE 116* 149* 143* 158* 140*  --  154*  BUN 43* 37* 33* 35* 31*  --  33*  CREATININE 2.08* 1.90* 1.78* 1.91* 1.83* 1.64* 1.86*  CALCIUM 7.7* 8.1* 8.4* 8.2* 8.3*  --  8.4*  MG 1.8  --   --   --   --   --   --    GFR: Estimated Creatinine Clearance: 44.2 mL/min (A) (by C-G formula based on SCr of 1.86 mg/dL (H)). Liver Function Tests:  Recent Labs Lab 03/18/17 0345  AST 16  ALT 10*  ALKPHOS 37*  BILITOT 0.2*  PROT 5.1*  ALBUMIN 2.1*   No results for input(s): LIPASE, AMYLASE in the last 168 hours. No results for input(s): AMMONIA in the last 168 hours. Coagulation Profile:  Recent Labs Lab 03/15/17 0420  INR 1.15   Cardiac Enzymes:  Recent Labs Lab 03/13/17 0506  TROPONINI 18.72*   BNP (last 3 results) No results for input(s): PROBNP in the last 8760 hours. HbA1C: No results for input(s): HGBA1C in the last 72 hours. CBG:  Recent Labs Lab 03/18/17 1327 03/18/17 1649 03/18/17 2100 03/19/17 0813 03/19/17 1134  GLUCAP 133* 183* 165* 150* 245*   Lipid Profile: No results for input(s): CHOL, HDL, LDLCALC, TRIG, CHOLHDL, LDLDIRECT in the last 72 hours. Thyroid Function Tests: No results for input(s): TSH, T4TOTAL, FREET4, T3FREE, THYROIDAB in the last 72 hours. Anemia Panel: No results for input(s): VITAMINB12, FOLATE, FERRITIN, TIBC, IRON, RETICCTPCT in the last 72 hours. Sepsis Labs:  Recent Labs Lab 03/13/17 0506 03/15/17 0420  PROCALCITON 0.87 0.27    Recent Results (from the past 240 hour(s))  Culture, blood (routine x 2)     Status: None   Collection Time: 03/11/17  9:20 AM    Result Value Ref Range Status   Specimen Description BLOOD LEFT ARM  Final   Special Requests   Final    BOTTLES DRAWN AEROBIC AND ANAEROBIC Blood Culture results may not be optimal due to an inadequate volume of blood received in culture bottles   Culture NO GROWTH 5 DAYS  Final   Report Status 03/16/2017 FINAL  Final  Culture, blood (routine x 2)     Status: None   Collection Time: 03/11/17 10:22 AM  Result Value Ref Range Status   Specimen Description BLOOD LEFT HAND  Final   Special Requests   Final    BOTTLES DRAWN AEROBIC AND ANAEROBIC Blood  Culture results may not be optimal due to an inadequate volume of blood received in culture bottles   Culture NO GROWTH 5 DAYS  Final   Report Status 03/16/2017 FINAL  Final  Respiratory Panel by PCR     Status: Abnormal   Collection Time: 03/11/17  9:31 PM  Result Value Ref Range Status   Adenovirus NOT DETECTED NOT DETECTED Final   Coronavirus 229E NOT DETECTED NOT DETECTED Final   Coronavirus HKU1 NOT DETECTED NOT DETECTED Final   Coronavirus NL63 NOT DETECTED NOT DETECTED Final   Coronavirus OC43 NOT DETECTED NOT DETECTED Final   Metapneumovirus NOT DETECTED NOT DETECTED Final   Rhinovirus / Enterovirus DETECTED (A) NOT DETECTED Final   Influenza A NOT DETECTED NOT DETECTED Final   Influenza B NOT DETECTED NOT DETECTED Final   Parainfluenza Virus 1 NOT DETECTED NOT DETECTED Final   Parainfluenza Virus 2 NOT DETECTED NOT DETECTED Final   Parainfluenza Virus 3 NOT DETECTED NOT DETECTED Final   Parainfluenza Virus 4 NOT DETECTED NOT DETECTED Final   Respiratory Syncytial Virus NOT DETECTED NOT DETECTED Final   Bordetella pertussis NOT DETECTED NOT DETECTED Final   Chlamydophila pneumoniae NOT DETECTED NOT DETECTED Final   Mycoplasma pneumoniae NOT DETECTED NOT DETECTED Final  MRSA PCR Screening     Status: None   Collection Time: 03/11/17  9:31 PM  Result Value Ref Range Status   MRSA by PCR NEGATIVE NEGATIVE Final    Comment:         The GeneXpert MRSA Assay (FDA approved for NASAL specimens only), is one component of a comprehensive MRSA colonization surveillance program. It is not intended to diagnose MRSA infection nor to guide or monitor treatment for MRSA infections.   Surgical pcr screen     Status: None   Collection Time: 03/17/17 10:42 AM  Result Value Ref Range Status   MRSA, PCR NEGATIVE NEGATIVE Final   Staphylococcus aureus NEGATIVE NEGATIVE Final    Comment:        The Xpert SA Assay (FDA approved for NASAL specimens in patients over 12 years of age), is one component of a comprehensive surveillance program.  Test performance has been validated by [facility] for patients greater than or equal to 74 year old. It is not intended to diagnose infection nor to guide or monitor treatment.          Radiology Studies: Dg Chest 2 View  Result Date: 03/18/2017 CLINICAL DATA:  Shortness of Breath EXAM: CHEST  2 VIEW COMPARISON:  March 14, 2017 FINDINGS: There is patchy airspace consolidation with volume loss in the posterior segment of the right upper lobe. There is mild perihilar interstitial edema bilaterally. Heart is upper normal in size with pulmonary vascularity within normal limits. No adenopathy. No bone lesions. IMPRESSION: Patchy airspace opacity with volume loss posterior segment right upper lobe. Suspect pneumonia in this area. There is mild perihilar interstitial edema. Stable cardiac silhouette with heart upper normal in size. Electronically Signed   By: Bretta Bang III M.D.   On: 03/18/2017 07:59   Ct Chest Wo Contrast  Result Date: 03/18/2017 CLINICAL DATA:  Aortic dilatation. Pt states been having some SOB and is going for CABG next week. EXAM: CT CHEST WITHOUT CONTRAST TECHNIQUE: Multidetector CT imaging of the chest was performed following the standard protocol without IV contrast. COMPARISON:  Chest radiograph 03/18/2017 FINDINGS: Cardiovascular: Coronary artery calcification  and aortic atherosclerotic calcification. No pericardial fluid. Ascending thoracic aorta within normal limits (36  mm). Mediastinum/Nodes: No axillary or supraclavicular adenopathy. No mediastinal hilar adenopathy. Small calcified lymph nodes are in the mediastinum. Esophagus normal. Lungs/Pleura: Bilateral airspace disease within the RIGHT upper lobe and LEFT upper lobe as well as superior segment of the LEFT lower lobe. Airspace disease reaches confluence in the upper lobes. Bilateral pleural effusions which are moderate in volume. Several pulmonary nodules with partial calcification are present. For example RIGHT middle lobe nodule measuring 7 mm (image 106, series 8). Similar nodule in the posterior RIGHT lower lobe measuring 9 mm on image 104, series 3. Upper Abdomen: Limited view of the liver, kidneys, pancreas are unremarkable. Normal adrenal glands. Musculoskeletal: No aggressive osseous lesion. IMPRESSION: 1. Bilateral multifocal pneumonia within the upper lobes. 2. Moderate bilateral pleural effusions. 3. Evidence of prior granulomatous disease with calcified mediastinal lymph nodes and partially calcified pulmonary nodules. These results will be called to the ordering clinician or representative by the Radiologist Assistant, and communication documented in the PACS or zVision Dashboard. Electronically Signed   By: Genevive Bi M.D.   On: 03/18/2017 10:51   Mr Cardiac Morphology W Wo Contrast  Result Date: 03/17/2017 CLINICAL DATA:  Ischemic cardiomyopathy, assess for viability. EXAM: CARDIAC MRI TECHNIQUE: The patient was scanned on a 1.5 Tesla GE magnet. A dedicated cardiac coil was used. Functional imaging was done using Fiesta sequences. 2,3, and 4 chamber views were done to assess for RWMA's. Modified Simpson's rule using a short axis stack was used to calculate an ejection fraction on a dedicated work [employer]. The patient received 28 cc of Multihance. After 10 minutes  inversion recovery sequences were used to assess for infiltration and scar tissue. CONTRAST:  28 cc Multihance contrast. FINDINGS: There were small to moderate bilateral pleural effusions. There was a small, primarily inferior pericardial effusion. Normal left ventricular size and wall thickness. Mid to apical anteroseptal severe hypokinesis, severe hypokinesis of the apical lateral wall, severe hypokinesis of the mid anterior wall with akinesis of the apical anterior wall, inferolateral hypokinesis, severe hypokinesis of the true apex. Normal right ventricular size and systolic function. Normal right and left atrial sizes. No significant mitral regurgitation noted. Trileaflet aortic valve with no stenosis or regurgitation. Delayed enhancement imaging: 26-50% wall thickness subendocardial late gadolinium enhancement (LGE) in the mid anterior and apical anterior walls. 50% wall thickness subendocardial LGE at the true apex. Measurements: LVEDV 188 mL LVSV 64 mL LVEF 34% IMPRESSION: 1.  Small to moderate bilateral pleural effusions. 2. Normal LV size with wall motion abnormalities as noted above. EF 34%. 3.  Normal RV size and systolic function. 4. Delayed enhancement pattern suggests that hypokinetic segments of the LV have a good chance of improving with revascularization (all segments would be expected to be viable except for the true apex which is questionable). Dalton Mclean Electronically Signed   By: Marca Ancona M.D.   On: 03/17/2017 23:51        Scheduled Meds: . aspirin  81 mg Oral Daily  . carvedilol  3.125 mg Oral BID WC  . chlorhexidine  15 mL Mouth Rinse BID  . citalopram  20 mg Oral Daily  . heparin  5,000 Units Subcutaneous Q8H  . insulin aspart  0-9 Units Subcutaneous TID WC  . mouth rinse  15 mL Mouth Rinse q12n4p  . potassium chloride  40 mEq Oral Daily  . rosuvastatin  5 mg Oral QHS  . sodium chloride flush  3 mL Intravenous Q12H  . sodium chloride flush  3 mL Intravenous Q12H     Continuous Infusions: . sodium chloride    . [START ON 03/20/2017] cefTRIAXone (ROCEPHIN)  IV       LOS: 8 days    Zannie Cove, MD [facility] Pager [phone] If 7PM-7AM, please contact night-coverage [email or web address] Password [email or web address] 03/19/2017, 11:55 AM

## 2017-03-19 NOTE — Progress Notes (Signed)
CARDIAC REHAB PHASE I   Patient currently has company in room. He stated that he walked earlier today with wife. Encouraged two more walks today. Stated that he would with nurse or wife.   Douglas ListerMolly M Zully Frane, MS 03/19/2017 12:55 PM

## 2017-03-20 LAB — MAGNESIUM: Magnesium: 1.9 mg/dL (ref 1.7–2.4)

## 2017-03-20 LAB — CBC
HCT: 27 % — ABNORMAL LOW (ref 39.0–52.0)
Hemoglobin: 8.6 g/dL — ABNORMAL LOW (ref 13.0–17.0)
MCH: 28.9 pg (ref 26.0–34.0)
MCHC: 31.9 g/dL (ref 30.0–36.0)
MCV: 90.6 fL (ref 78.0–100.0)
Platelets: 301 10*3/uL (ref 150–400)
RBC: 2.98 MIL/uL — ABNORMAL LOW (ref 4.22–5.81)
RDW: 13.1 % (ref 11.5–15.5)
WBC: 11.5 10*3/uL — ABNORMAL HIGH (ref 4.0–10.5)

## 2017-03-20 LAB — VAS US DOPPLER PRE CABG
LEFT ECA DIAS: -11 cm/s
LEFT VERTEBRAL DIAS: -14 cm/s
Left CCA dist dias: -21 cm/s
Left CCA dist sys: -84 cm/s
Left CCA prox dias: 18 cm/s
Left CCA prox sys: 106 cm/s
Left ICA dist dias: -38 cm/s
Left ICA dist sys: -80 cm/s
Left ICA prox dias: -19 cm/s
Left ICA prox sys: -75 cm/s
RIGHT ECA DIAS: -9 cm/s
RIGHT VERTEBRAL DIAS: 18 cm/s
Right CCA prox dias: 13 cm/s
Right CCA prox sys: 77 cm/s
Right cca dist sys: -56 cm/s

## 2017-03-20 LAB — GLUCOSE, CAPILLARY
Glucose-Capillary: 162 mg/dL — ABNORMAL HIGH (ref 65–99)
Glucose-Capillary: 201 mg/dL — ABNORMAL HIGH (ref 65–99)
Glucose-Capillary: 114 mg/dL — ABNORMAL HIGH (ref 65–99)
Glucose-Capillary: 253 mg/dL — ABNORMAL HIGH (ref 65–99)

## 2017-03-20 LAB — BASIC METABOLIC PANEL
Anion gap: 10 (ref 5–15)
BUN: 38 mg/dL — ABNORMAL HIGH (ref 6–20)
CO2: 27 mmol/L (ref 22–32)
Calcium: 8.2 mg/dL — ABNORMAL LOW (ref 8.9–10.3)
Chloride: 102 mmol/L (ref 101–111)
Creatinine, Ser: 2.11 mg/dL — ABNORMAL HIGH (ref 0.61–1.24)
GFR calc Af Amer: 38 mL/min — ABNORMAL LOW (ref >60)
GFR calc non Af Amer: 33 mL/min — ABNORMAL LOW (ref >60)
Glucose, Bld: 144 mg/dL — ABNORMAL HIGH (ref 65–99)
Potassium: 4.1 mmol/L (ref 3.5–5.1)
Sodium: 139 mmol/L (ref 135–145)

## 2017-03-20 MED ORDER — POTASSIUM CHLORIDE CRYS ER 20 MEQ PO TBCR
20.0000 meq | EXTENDED_RELEASE_TABLET | Freq: Every day | ORAL | Status: DC
  Administered 2017-03-21: 20 meq via ORAL
  Filled 2017-03-20: qty 1

## 2017-03-20 NOTE — Progress Notes (Signed)
PROGRESS NOTE    Douglas Cannon  ZOX:[id number] DOB: 01-Jan-1956 DOA: 03/11/2017 PCP: Benita Stabile, MD   Brief Narrative: 61 y.o. male with medical history significant of HTN, HLD, DM Type 2, BPH, CKD Stage 3, Anemia, Anxiety, and ?Diastolic HF with other comorbids who presented to the [facility] ED with worsening shortness of breath and leg swelling for about 2 days. On admission patient was found to have elevated troponin, BNP concerning for CHF and possible pneumonia. Patient was started on heparin drip, IV antibiotics, nitro drip and transferred to [facility] for further evaluation. Evaluated by cardiologist and pulmonary critical care team.  Assessment & Plan:  #Acute hypoxic respiratory failure due to acute CHF/pulm edema and viral illness-rhinovirus -OFF BIPAP, Hypoxia improving with diuresis -diuresed with IV lasix, negative 6L, changed to PO lasix, now off -Respiratory viral studies positive for rhinovirus. -also on Abx for pneumonia, off Azithromycin and completed 7days of ceftriaxone, Abx restarted  per Dr.Van Trigt 5/4 -weaned off O2, ambulating, incentive spirometry -remains euvolemic, lasix Dced 5/5 since filling pressures normal now  #  NSTEMI: Peak troponin level 34.9. - OFF IV heparin and nitro gtt now - s/p Left  Heart cath 5/1 with multivessel CAD, CVTS consult for CABG - volume status improving, lasix changed to Po, now held as above - s/p  R heart cath with normal filling pressures -Continue aspirin, statin, coreg  #Acute systolic congestive heart failure.  -ischemic cardiomyopathy -Echo showed EF of 30-35%  -diuresed with IV lasix, creatinine trending up, lasix changed to PO -s/p Cardiac MRI and R heart cath-normal filling pressures  #Chronic DVT -chronic deep vein thrombosis involving a minute branch of the left distal posterior tibial vein of   the left lower extremity, does not need any RX for this, called and d/w with [facility] Dr.Fields  #Acute on  chronic kidney disease stage III: -baseline serum creatinine level around 1.5-2. -Ultrasound of kidneys with medical renal disease.  -Serum creatinine fluctuating with diuresis, now 1.9, diuretics changed to PO, creatinine 2.1 this am -Avoid nephrotoxins, monitor Bmet  #Hypokalemia in the setting of diuretics: - Improved. Change to daily   #Hyperlipidemia: Continue statin  #Type 2 diabetes: - Continue sliding scale.  - Hba1c 6.3  DVT prophylaxis: Lovenox Code Status: Full code Family Communication: wife at bedside Disposition Plan: Home after CABG likely   Consultants:   Cardiologist  Pulmonary critical care  Procedures: Noninvasive ventilation Antimicrobials: Azithromycin and ceftriaxone since 4/27  Subjective: No complaints, in good spirits  Objective: Vitals:   03/19/17 2100 03/20/17 0057 03/20/17 0500 03/20/17 0815  BP: 129/80 139/76 134/76 (!) 143/81  Pulse: 66 62 72 71  Resp: 18 16 18 16   Temp: 98.4 F (36.9 C) 98.2 F (36.8 C) 98.4 F (36.9 C) 97.8 F (36.6 C)  TempSrc: Oral Oral Oral Oral  SpO2: 100% 98% 97% 99%  Weight:   78.7 kg (173 lb 9.6 oz)   Height:        Intake/Output Summary (Last 24 hours) at 03/20/17 1053 Last data filed at 03/20/17 0500  Gross per 24 hour  Intake              260 ml  Output              830 ml  Net             -570 ml   Filed Weights   03/18/17 0509 03/19/17 0402 03/20/17 0500  Weight: 78.7 kg (173  lb 9.6 oz) 79.1 kg (174 lb 4.8 oz) 78.7 kg (173 lb 9.6 oz)    Examination:  General exam: AAOX3, pleasant, no distress, smiling Respiratory system: CTAB Cardiovascular system: S1S2/RRR Gastrointestinal system: soft , NT, BS present Central nervous system: Alert awake and non focal Extremities: no edema Skin: no rashes  Data Reviewed: I have personally reviewed following labs and imaging studies  CBC:  Recent Labs Lab 03/17/17 0237 03/18/17 0345 03/18/17 1549 03/19/17 0414 03/20/17 0237  WBC 11.5*  10.8* 8.8 11.0* 11.5*  HGB 9.1* 9.1* 9.3* 9.3* 8.6*  HCT 27.0* 27.4* 27.5* 29.2* 27.0*  MCV 89.1 89.3 89.9 90.4 90.6  PLT 275 265 280 341 301   Basic Metabolic Panel:  Recent Labs Lab 03/14/17 0313  03/16/17 0746 03/17/17 0237 03/18/17 0345 03/18/17 1549 03/19/17 0414 03/20/17 0237 03/20/17 0611  NA 137  < > 138 137 138  --  139 139  --   K 3.2*  < > 3.7 3.5 3.9  --  4.0 4.1  --   CL 104  < > 100* 101 101  --  99* 102  --   CO2 24  < > 26 26 28   --  29 27  --   GLUCOSE 116*  < > 143* 158* 140*  --  154* 144*  --   BUN 43*  < > 33* 35* 31*  --  33* 38*  --   CREATININE 2.08*  < > 1.78* 1.91* 1.83* 1.64* 1.86* 2.11*  --   CALCIUM 7.7*  < > 8.4* 8.2* 8.3*  --  8.4* 8.2*  --   MG 1.8  --   --   --   --   --   --   --  1.9  < > = values in this interval not displayed. GFR: Estimated Creatinine Clearance: 38.9 mL/min (A) (by C-G formula based on SCr of 2.11 mg/dL (H)). Liver Function Tests:  Recent Labs Lab 03/18/17 0345  AST 16  ALT 10*  ALKPHOS 37*  BILITOT 0.2*  PROT 5.1*  ALBUMIN 2.1*   No results for input(s): LIPASE, AMYLASE in the last 168 hours. No results for input(s): AMMONIA in the last 168 hours. Coagulation Profile:  Recent Labs Lab 03/15/17 0420  INR 1.15   Cardiac Enzymes: No results for input(s): CKTOTAL, CKMB, CKMBINDEX, TROPONINI in the last 168 hours. BNP (last 3 results) No results for input(s): PROBNP in the last 8760 hours. HbA1C: No results for input(s): HGBA1C in the last 72 hours. CBG:  Recent Labs Lab 03/19/17 0813 03/19/17 1134 03/19/17 1713 03/19/17 2137 03/20/17 0812  GLUCAP 150* 245* 108* 138* 162*   Lipid Profile: No results for input(s): CHOL, HDL, LDLCALC, TRIG, CHOLHDL, LDLDIRECT in the last 72 hours. Thyroid Function Tests: No results for input(s): TSH, T4TOTAL, FREET4, T3FREE, THYROIDAB in the last 72 hours. Anemia Panel: No results for input(s): VITAMINB12, FOLATE, FERRITIN, TIBC, IRON, RETICCTPCT in the last 72  hours. Sepsis Labs:  Recent Labs Lab 03/15/17 0420  PROCALCITON 0.27    Recent Results (from the past 240 hour(s))  Culture, blood (routine x 2)     Status: None   Collection Time: 03/11/17  9:20 AM  Result Value Ref Range Status   Specimen Description BLOOD LEFT ARM  Final   Special Requests   Final    BOTTLES DRAWN AEROBIC AND ANAEROBIC Blood Culture results may not be optimal due to an inadequate volume of blood received in culture bottles  Culture NO GROWTH 5 DAYS  Final   Report Status 03/16/2017 FINAL  Final  Culture, blood (routine x 2)     Status: None   Collection Time: 03/11/17 10:22 AM  Result Value Ref Range Status   Specimen Description BLOOD LEFT HAND  Final   Special Requests   Final    BOTTLES DRAWN AEROBIC AND ANAEROBIC Blood Culture results may not be optimal due to an inadequate volume of blood received in culture bottles   Culture NO GROWTH 5 DAYS  Final   Report Status 03/16/2017 FINAL  Final  Respiratory Panel by PCR     Status: Abnormal   Collection Time: 03/11/17  9:31 PM  Result Value Ref Range Status   Adenovirus NOT DETECTED NOT DETECTED Final   Coronavirus 229E NOT DETECTED NOT DETECTED Final   Coronavirus HKU1 NOT DETECTED NOT DETECTED Final   Coronavirus NL63 NOT DETECTED NOT DETECTED Final   Coronavirus OC43 NOT DETECTED NOT DETECTED Final   Metapneumovirus NOT DETECTED NOT DETECTED Final   Rhinovirus / Enterovirus DETECTED (A) NOT DETECTED Final   Influenza A NOT DETECTED NOT DETECTED Final   Influenza B NOT DETECTED NOT DETECTED Final   Parainfluenza Virus 1 NOT DETECTED NOT DETECTED Final   Parainfluenza Virus 2 NOT DETECTED NOT DETECTED Final   Parainfluenza Virus 3 NOT DETECTED NOT DETECTED Final   Parainfluenza Virus 4 NOT DETECTED NOT DETECTED Final   Respiratory Syncytial Virus NOT DETECTED NOT DETECTED Final   Bordetella pertussis NOT DETECTED NOT DETECTED Final   Chlamydophila pneumoniae NOT DETECTED NOT DETECTED Final    Mycoplasma pneumoniae NOT DETECTED NOT DETECTED Final  MRSA PCR Screening     Status: None   Collection Time: 03/11/17  9:31 PM  Result Value Ref Range Status   MRSA by PCR NEGATIVE NEGATIVE Final    Comment:        The GeneXpert MRSA Assay (FDA approved for NASAL specimens only), is one component of a comprehensive MRSA colonization surveillance program. It is not intended to diagnose MRSA infection nor to guide or monitor treatment for MRSA infections.   Surgical pcr screen     Status: None   Collection Time: 03/17/17 10:42 AM  Result Value Ref Range Status   MRSA, PCR NEGATIVE NEGATIVE Final   Staphylococcus aureus NEGATIVE NEGATIVE Final    Comment:        The Xpert SA Assay (FDA approved for NASAL specimens in patients over 60 years of age), is one component of a comprehensive surveillance program.  Test performance has been validated by [facility] for patients greater than or equal to 60 year old. It is not intended to diagnose infection nor to guide or monitor treatment.          Radiology Studies: No results found.      Scheduled Meds: . aspirin  81 mg Oral Daily  . carvedilol  3.125 mg Oral BID WC  . chlorhexidine  15 mL Mouth Rinse BID  . citalopram  20 mg Oral Daily  . heparin  5,000 Units Subcutaneous Q8H  . insulin aspart  0-9 Units Subcutaneous TID WC  . mouth rinse  15 mL Mouth Rinse q12n4p  . potassium chloride  40 mEq Oral Daily  . rosuvastatin  5 mg Oral QHS  . sodium chloride flush  3 mL Intravenous Q12H  . sodium chloride flush  3 mL Intravenous Q12H   Continuous Infusions: . sodium chloride    . cefTRIAXone (ROCEPHIN)  IV Stopped (03/20/17 0453)     LOS: 9 days    Zannie Cove, MD [facility] Pager [phone] If 7PM-7AM, please contact night-coverage [email or web address] Password [email or web address] 03/20/2017, 10:53 AM

## 2017-03-20 NOTE — Plan of Care (Signed)
Problem: Education: Goal: Knowledge of [facility] Education information/materials will improve Outcome: Progressing Patient, RN and wife spoke about when he can expect in preparation for his procedure.

## 2017-03-20 NOTE — Progress Notes (Signed)
Dr Croitoru's rounding note reviewed, patient is awaiting CABG after being admitted with NSTEMI, has low LVEF. Marland Kitchen. RHC yesterday with normal CI and filling pressures, he is optimized from a fluid standpoint for surgery. Prior echo LVEF 30-35%. Medical therapy with ASA, coreg 3.125mg  bid, crestor 5. No ACE/ARB/aldactone in due to decreased renal function. In general would avoid aggressive medication titration prior to CABG.   Continued uptrend in Cr. Received lasix 40mg  po x 1 yesterday, had been on bid dosing. Would hold lasix again today.   particularly given is recent fillng pressuers by RHC looked good. No additioncal cardiology recs at this time, please call with questions over the weekend.    Dominga FerryJ Gianluca Chhim MD

## 2017-03-21 ENCOUNTER — Inpatient Hospital Stay (HOSPITAL_COMMUNITY): Payer: BLUE CROSS/BLUE SHIELD

## 2017-03-21 DIAGNOSIS — I25110 Atherosclerotic heart disease of native coronary artery with unstable angina pectoris: Secondary | ICD-10-CM

## 2017-03-21 LAB — CBC
HCT: 27.7 % — ABNORMAL LOW (ref 39.0–52.0)
Hemoglobin: 9 g/dL — ABNORMAL LOW (ref 13.0–17.0)
MCH: 29.3 pg (ref 26.0–34.0)
MCHC: 32.5 g/dL (ref 30.0–36.0)
MCV: 90.2 fL (ref 78.0–100.0)
Platelets: 313 10*3/uL (ref 150–400)
RBC: 3.07 MIL/uL — ABNORMAL LOW (ref 4.22–5.81)
RDW: 12.9 % (ref 11.5–15.5)
WBC: 11 10*3/uL — ABNORMAL HIGH (ref 4.0–10.5)

## 2017-03-21 LAB — PULMONARY FUNCTION TEST
DL/VA % pred: 71 %
DL/VA: 3.29 ml/min/mmHg/L
DLCO cor % pred: 52 %
DLCO cor: 16.83 ml/min/mmHg
DLCO unc % pred: 41 %
DLCO unc: 13.4 ml/min/mmHg
FEF 25-75 Post: 4.5 L/sec
FEF 25-75 Pre: 4.16 L/sec
FEF2575-%Change-Post: 8 %
FEF2575-%Pred-Post: 150 %
FEF2575-%Pred-Pre: 138 %
FEV1-%Change-Post: 1 %
FEV1-%Pred-Post: 92 %
FEV1-%Pred-Pre: 90 %
FEV1-Post: 3.34 L
FEV1-Pre: 3.29 L
FEV1FVC-%Change-Post: 2 %
FEV1FVC-%Pred-Pre: 114 %
FEV6-%Change-Post: -1 %
FEV6-%Pred-Post: 81 %
FEV6-%Pred-Pre: 82 %
FEV6-Post: 3.73 L
FEV6-Pre: 3.77 L
FEV6FVC-%Pred-Post: 105 %
FEV6FVC-%Pred-Pre: 105 %
FVC-%Change-Post: 0 %
FVC-%Pred-Post: 78 %
FVC-%Pred-Pre: 78 %
FVC-Post: 3.73 L
FVC-Pre: 3.77 L
Post FEV1/FVC ratio: 90 %
Post FEV6/FVC ratio: 100 %
Pre FEV1/FVC ratio: 87 %
Pre FEV6/FVC Ratio: 100 %
RV % pred: 73 %
RV: 1.64 L
TLC % pred: 78 %
TLC: 5.52 L

## 2017-03-21 LAB — ABO/RH: ABO/RH(D): B POS

## 2017-03-21 LAB — BASIC METABOLIC PANEL
Anion gap: 7 (ref 5–15)
BUN: 35 mg/dL — ABNORMAL HIGH (ref 6–20)
CO2: 28 mmol/L (ref 22–32)
Calcium: 8.3 mg/dL — ABNORMAL LOW (ref 8.9–10.3)
Chloride: 103 mmol/L (ref 101–111)
Creatinine, Ser: 1.82 mg/dL — ABNORMAL HIGH (ref 0.61–1.24)
GFR calc Af Amer: 45 mL/min — ABNORMAL LOW (ref >60)
GFR calc non Af Amer: 39 mL/min — ABNORMAL LOW (ref >60)
Glucose, Bld: 178 mg/dL — ABNORMAL HIGH (ref 65–99)
Potassium: 4.5 mmol/L (ref 3.5–5.1)
Sodium: 138 mmol/L (ref 135–145)

## 2017-03-21 LAB — BLOOD GAS, ARTERIAL
Acid-Base Excess: 3.5 mmol/L — ABNORMAL HIGH (ref 0.0–2.0)
Bicarbonate: 27.1 mmol/L (ref 20.0–28.0)
Drawn by: 448981
FIO2: 21
O2 Saturation: 93.6 %
Patient temperature: 98.6
pCO2 arterial: 38.4 mmHg (ref 32.0–48.0)
pH, Arterial: 7.462 — ABNORMAL HIGH (ref 7.350–7.450)
pO2, Arterial: 71.6 mmHg — ABNORMAL LOW (ref 83.0–108.0)

## 2017-03-21 LAB — GLUCOSE, CAPILLARY
Glucose-Capillary: 217 mg/dL — ABNORMAL HIGH (ref 65–99)
Glucose-Capillary: 158 mg/dL — ABNORMAL HIGH (ref 65–99)
Glucose-Capillary: 167 mg/dL — ABNORMAL HIGH (ref 65–99)
Glucose-Capillary: 117 mg/dL — ABNORMAL HIGH (ref 65–99)

## 2017-03-21 MED ORDER — EPINEPHRINE PF 1 MG/ML IJ SOLN
0.0000 ug/min | INTRAVENOUS | Status: DC
  Filled 2017-03-21 (×2): qty 4

## 2017-03-21 MED ORDER — CEFUROXIME SODIUM 1.5 G IJ SOLR
1.5000 g | INTRAMUSCULAR | Status: DC
  Administered 2017-03-22: 1.5 g via INTRAVENOUS
  Administered 2017-03-22: .75 g via INTRAVENOUS
  Filled 2017-03-21 (×2): qty 1.5

## 2017-03-21 MED ORDER — SODIUM CHLORIDE 0.9 % IV SOLN
INTRAVENOUS | Status: DC
  Filled 2017-03-21 (×2): qty 30

## 2017-03-21 MED ORDER — POTASSIUM CHLORIDE 2 MEQ/ML IV SOLN
80.0000 meq | INTRAVENOUS | Status: DC
  Filled 2017-03-21 (×2): qty 40

## 2017-03-21 MED ORDER — SODIUM CHLORIDE 0.9 % IV SOLN
INTRAVENOUS | Status: DC
  Administered 2017-03-22: 1.9 [IU]/h via INTRAVENOUS
  Filled 2017-03-21 (×2): qty 2.5

## 2017-03-21 MED ORDER — TEMAZEPAM 15 MG PO CAPS
15.0000 mg | ORAL_CAPSULE | Freq: Once | ORAL | Status: AC | PRN
  Administered 2017-03-21: 15 mg via ORAL
  Filled 2017-03-21: qty 1

## 2017-03-21 MED ORDER — DEXTROSE 5 % IV SOLN
750.0000 mg | INTRAVENOUS | Status: DC
  Filled 2017-03-21 (×2): qty 750

## 2017-03-21 MED ORDER — CHLORHEXIDINE GLUCONATE 0.12 % MT SOLN
15.0000 mL | Freq: Once | OROMUCOSAL | Status: AC
  Administered 2017-03-22: 15 mL via OROMUCOSAL
  Filled 2017-03-21: qty 15

## 2017-03-21 MED ORDER — SODIUM CHLORIDE 0.9 % IV SOLN
1.5000 mg/kg/h | INTRAVENOUS | Status: DC
  Filled 2017-03-21 (×2): qty 25

## 2017-03-21 MED ORDER — PLASMA-LYTE 148 IV SOLN
INTRAVENOUS | Status: DC
  Filled 2017-03-21 (×2): qty 2.5

## 2017-03-21 MED ORDER — DIAZEPAM 5 MG PO TABS
5.0000 mg | ORAL_TABLET | Freq: Once | ORAL | Status: AC
  Administered 2017-03-22: 5 mg via ORAL
  Filled 2017-03-21: qty 1

## 2017-03-21 MED ORDER — SODIUM CHLORIDE 0.9 % IV SOLN
30.0000 ug/min | INTRAVENOUS | Status: DC
  Filled 2017-03-21 (×2): qty 2

## 2017-03-21 MED ORDER — TRANEXAMIC ACID (OHS) PUMP PRIME SOLUTION
2.0000 mg/kg | INTRAVENOUS | Status: DC
  Administered 2017-03-22: 237 mL/h
  Filled 2017-03-21 (×2): qty 1.58

## 2017-03-21 MED ORDER — METOPROLOL TARTRATE 12.5 MG HALF TABLET
12.5000 mg | ORAL_TABLET | Freq: Once | ORAL | Status: AC
  Administered 2017-03-22: 12.5 mg via ORAL
  Filled 2017-03-21: qty 1

## 2017-03-21 MED ORDER — DOPAMINE-DEXTROSE 3.2-5 MG/ML-% IV SOLN
0.0000 ug/kg/min | INTRAVENOUS | Status: DC
  Filled 2017-03-21: qty 250

## 2017-03-21 MED ORDER — NITROGLYCERIN IN D5W 200-5 MCG/ML-% IV SOLN
2.0000 ug/min | INTRAVENOUS | Status: DC
  Filled 2017-03-21: qty 250

## 2017-03-21 MED ORDER — ALBUTEROL SULFATE (2.5 MG/3ML) 0.083% IN NEBU
2.5000 mg | INHALATION_SOLUTION | Freq: Once | RESPIRATORY_TRACT | Status: AC
  Administered 2017-03-21: 2.5 mg via RESPIRATORY_TRACT

## 2017-03-21 MED ORDER — BISACODYL 5 MG PO TBEC: 5.0000 mg | DELAYED_RELEASE_TABLET | Freq: Once | ORAL | Status: DC

## 2017-03-21 MED ORDER — MAGNESIUM SULFATE 50 % IJ SOLN
40.0000 meq | INTRAMUSCULAR | Status: DC
  Filled 2017-03-21 (×2): qty 10

## 2017-03-21 MED ORDER — SODIUM CHLORIDE 0.9 % IV SOLN
1250.0000 mg | INTRAVENOUS | Status: DC
  Administered 2017-03-22: 1250 mg via INTRAVENOUS
  Filled 2017-03-21 (×2): qty 1250

## 2017-03-21 MED ORDER — TRANEXAMIC ACID (OHS) BOLUS VIA INFUSION
15.0000 mg/kg | INTRAVENOUS | Status: DC
  Filled 2017-03-21: qty 1188

## 2017-03-21 MED ORDER — CHLORHEXIDINE GLUCONATE 4 % EX LIQD
60.0000 mL | Freq: Once | CUTANEOUS | Status: AC
  Administered 2017-03-21: 4 via TOPICAL
  Filled 2017-03-21: qty 60

## 2017-03-21 MED ORDER — DEXMEDETOMIDINE HCL IN NACL 400 MCG/100ML IV SOLN
0.1000 ug/kg/h | INTRAVENOUS | Status: DC
  Administered 2017-03-22: .2 ug/kg/h via INTRAVENOUS
  Filled 2017-03-21 (×2): qty 100

## 2017-03-21 MED ORDER — CHLORHEXIDINE GLUCONATE 4 % EX LIQD
60.0000 mL | Freq: Once | CUTANEOUS | Status: AC
  Administered 2017-03-22: 4 via TOPICAL

## 2017-03-21 NOTE — Progress Notes (Signed)
3 Days Post-Op Procedure(s) (LRB): Right Heart Cath (N/A) Subjective: Comfortable on room air Creat 1.8 Walking in hallway Plan CABG in am for severe CAD and ischemic cardiomyopathy  Objective: Vital signs in last 24 hours: Temp:  [98.1 F (36.7 C)-98.2 F (36.8 C)] 98.2 F (36.8 C) (05/07 0445) Pulse Rate:  [66-68] 68 (05/07 0009) Cardiac Rhythm: Normal sinus rhythm;Heart block (05/07 0704) Resp:  [18] 18 (05/07 0009) BP: (124-141)/(72-86) 141/86 (05/07 0445) SpO2:  [92 %-98 %] 94 % (05/07 0445) Weight:  [174 lb 11.2 oz (79.2 kg)] 174 lb 11.2 oz (79.2 kg) (05/07 0445)  Hemodynamic parameters for last 24 hours:  nsr  Intake/Output from previous day: 05/06 0701 - 05/07 0700 In: 750 [P.O.:750] Out: 1050 [Urine:1050] Intake/Output this shift: Total I/O In: 240 [P.O.:240] Out: -        Exam    General- alert and comfortable   Lungs- clear without rales, wheezes   Cor- regular rate and rhythm, no murmur , gallop   Abdomen- soft, non-tender   Extremities - warm, non-tender, minimal edema   Neuro- oriented, appropriate, no focal weakness   Lab Results:  Recent Labs  03/20/17 0237 03/21/17 0515  WBC 11.5* 11.0*  HGB 8.6* 9.0*  HCT 27.0* 27.7*  PLT 301 313   BMET:  Recent Labs  03/20/17 0237 03/21/17 0515  NA 139 138  K 4.1 4.5  CL 102 103  CO2 27 28  GLUCOSE 144* 178*  BUN 38* 35*  CREATININE 2.11* 1.82*  CALCIUM 8.2* 8.3*    PT/INR: No results for input(s): LABPROT, INR in the last 72 hours. ABG    Component Value Date/Time   PHART 7.423 03/11/2017 1115   HCO3 27.6 03/18/2017 1259   HCO3 27.9 03/18/2017 1259   TCO2 29 03/18/2017 1259   TCO2 29 03/18/2017 1259   ACIDBASEDEF 6.7 (H) 03/11/2017 1115   O2SAT 57.0 03/18/2017 1259   O2SAT 54.0 03/18/2017 1259   CBG (last 3)   Recent Labs  03/20/17 1626 03/20/17 2128 03/21/17 0850  GLUCAP 114* 253* 217*    Assessment/Plan: S/P Procedure(s) (LRB): Right Heart Cath (N/A) Plan CABG in  am Patient understands he is at increased risk due to EF .20 and preop class IV acute systolic heart failure   LOS: 10 days    Kathlee Nationseter Van Trigt III 03/21/2017

## 2017-03-21 NOTE — Progress Notes (Signed)
CARDIAC REHAB PHASE I   PRE:  Rate/Rhythm: 78 SR  BP:  Sitting: 149/83        SaO2: 99 RA  MODE:  Ambulation: 1100 ft   POST:  Rate/Rhythm: 99 SR  BP:  Sitting: 151/84         SaO2: 100 RA  Pt ambulated 1100 ft on RA, independent, steady gait, tolerated well with no complaints. Answered pt's questions regarding pre-op education. Pt to recliner after walk, call bell within reach. Will follow post-op.   [id number] Douglas GrapesEmily C Kaylyne Axton, RN, BSN 03/21/2017 10:18 AM

## 2017-03-21 NOTE — Progress Notes (Signed)
Pt seen, no changes, remains stable and very pleasant, awaiting CABG Creatinine better than 5/6 CABG tomorrow  Zannie CovePreetha Alanny Rivers, MD

## 2017-03-21 NOTE — Progress Notes (Signed)
Progress Note  Patient Name: Douglas Cannon Date of Encounter: 03/21/2017  Primary Cardiologist: Diona Browner  Subjective   No chest pain or dyspnea   Inpatient Medications    Scheduled Meds: . aspirin  81 mg Oral Daily  . carvedilol  3.125 mg Oral BID WC  . chlorhexidine  15 mL Mouth Rinse BID  . citalopram  20 mg Oral Daily  . heparin  5,000 Units Subcutaneous Q8H  . insulin aspart  0-9 Units Subcutaneous TID WC  . mouth rinse  15 mL Mouth Rinse q12n4p  . potassium chloride  20 mEq Oral Daily  . rosuvastatin  5 mg Oral QHS  . sodium chloride flush  3 mL Intravenous Q12H  . sodium chloride flush  3 mL Intravenous Q12H   Continuous Infusions: . sodium chloride    . cefTRIAXone (ROCEPHIN)  IV Stopped (03/21/17 0549)   PRN Meds: sodium chloride, acetaminophen, acetaminophen, diazepam, hydrALAZINE, ipratropium-albuterol, levalbuterol, ondansetron (ZOFRAN) IV, ondansetron (ZOFRAN) IV, sodium chloride flush, sodium chloride flush   Vital Signs    Vitals:   03/20/17 1151 03/20/17 2030 03/21/17 0009 03/21/17 0445  BP: 124/80 139/72 130/78 (!) 141/86  Pulse: 67 66 68   Resp:  18 18   Temp: 98.1 F (36.7 C) 98.1 F (36.7 C) 98.1 F (36.7 C) 98.2 F (36.8 C)  TempSrc: Oral  Oral Oral  SpO2: 96% 98% 92% 94%  Weight:    174 lb 11.2 oz (79.2 kg)  Height:    5\' 10"  (1.778 m)    Intake/Output Summary (Last 24 hours) at 03/21/17 0848 Last data filed at 03/20/17 2100  Gross per 24 hour  Intake              750 ml  Output             1050 ml  Net             -300 ml   Filed Weights   03/19/17 0402 03/20/17 0500 03/21/17 0445  Weight: 174 lb 4.8 oz (79.1 kg) 173 lb 9.6 oz (78.7 kg) 174 lb 11.2 oz (79.2 kg)    Telemetry    NSR - Personally Reviewed  Physical Exam  Affect appropriate Healthy:  appears stated age HEENT: normal Neck supple with no adenopathy JVP normal no bruits no thyromegaly Lungs clear with no wheezing and good diaphragmatic motion Heart:  S1/S2 no  murmur, no rub, gallop or click PMI normal Abdomen: benighn, BS positve, no tenderness, no AAA no bruit.  No HSM or HJR Distal pulses intact with no bruits No edema Neuro non-focal Skin warm and dry No muscular weakness   Labs    Chemistry Recent Labs Lab 03/18/17 0345  03/19/17 0414 03/20/17 0237 03/21/17 0515  NA 138  --  139 139 138  K 3.9  --  4.0 4.1 4.5  CL 101  --  99* 102 103  CO2 28  --  29 27 28   GLUCOSE 140*  --  154* 144* 178*  BUN 31*  --  33* 38* 35*  CREATININE 1.83*  < > 1.86* 2.11* 1.82*  CALCIUM 8.3*  --  8.4* 8.2* 8.3*  PROT 5.1*  --   --   --   --   ALBUMIN 2.1*  --   --   --   --   AST 16  --   --   --   --   ALT 10*  --   --   --   --  ALKPHOS 37*  --   --   --   --   BILITOT 0.2*  --   --   --   --   GFRNONAA 39*  < > 38* 33* 39*  GFRAA 45*  < > 44* 38* 45*  ANIONGAP 9  --  11 10 7   < > = values in this interval not displayed.   Hematology  Recent Labs Lab 03/19/17 0414 03/20/17 0237 03/21/17 0515  WBC 11.0* 11.5* 11.0*  RBC 3.23* 2.98* 3.07*  HGB 9.3* 8.6* 9.0*  HCT 29.2* 27.0* 27.7*  MCV 90.4 90.6 90.2  MCH 28.8 28.9 29.3  MCHC 31.8 31.9 32.5  RDW 13.0 13.1 12.9  PLT 341 301 313    Cardiac EnzymesNo results for input(s): TROPONINI in the last 168 hours. No results for input(s): TROPIPOC in the last 168 hours.   BNP  Recent Labs Lab 03/17/17 0815  BNP 2,172.1*     DDimer No results for input(s): DDIMER in the last 168 hours.   Radiology    No results found.  Cardiac Studies   Extensive myocardial viability per MRI, as above  Patient Profile     61 y.o. male with diabetes mellitus and stage IIIC Cady, presenting with new onset left heart failure and pulmonary edema, decompensation likely triggered by right upper lobe pneumonia, found to have severe multivessel CAD and severely depressed left ventricular systolic function.  Assessment & Plan    1. CAD - severe multivessel CAD, For CABG in am with PVT   2.  Acute on chronic stage 3 renal failure: baseline creatinine probably 1.4-1.7, but has been >2 as recently as Feb 2017. Seems to be stable at around 1.8 on this admission.  3. CHF: right heart pressures low MRI with EF 34% but all segments appear viable suggesting EF should improve post revascularization   4. DM: most recent A1c excellent at 6.3%. Managed at home on oral agents. SSI while here.   5. HTN: good control without meds right now. Start low-dose carvedilol. Hold off renin-angiotensin inhibitors until after bypass surgery. Long-term, he is an excellent Entresto candidate, if EF remains under 40%.  Signed, Charlton HawsPeter Kanijah Groseclose, MD  03/21/2017, 8:48 AM

## 2017-03-22 ENCOUNTER — Inpatient Hospital Stay (HOSPITAL_COMMUNITY): Payer: BLUE CROSS/BLUE SHIELD | Admitting: Anesthesiology

## 2017-03-22 ENCOUNTER — Inpatient Hospital Stay (HOSPITAL_COMMUNITY)
Admission: EM | Disposition: A | Discharge status: Home / Self Care | Payer: Self-pay | Source: Home / Self Care | Attending: Internal Medicine

## 2017-03-22 ENCOUNTER — Inpatient Hospital Stay (HOSPITAL_COMMUNITY): Payer: BLUE CROSS/BLUE SHIELD

## 2017-03-22 ENCOUNTER — Encounter (HOSPITAL_COMMUNITY): Payer: Self-pay | Admitting: Anesthesiology

## 2017-03-22 DIAGNOSIS — Z951 Presence of aortocoronary bypass graft: Secondary | ICD-10-CM

## 2017-03-22 HISTORY — PX: TEE WITHOUT CARDIOVERSION: SHX5443

## 2017-03-22 HISTORY — PX: CORONARY ARTERY BYPASS GRAFT: SHX141

## 2017-03-22 LAB — PREPARE RBC (CROSSMATCH)

## 2017-03-22 LAB — CBC
HCT: 27.2 % — ABNORMAL LOW (ref 39.0–52.0)
HCT: 31.1 % — ABNORMAL LOW (ref 39.0–52.0)
HCT: 30.4 % — ABNORMAL LOW (ref 39.0–52.0)
Hemoglobin: 9.1 g/dL — ABNORMAL LOW (ref 13.0–17.0)
Hemoglobin: 10.3 g/dL — ABNORMAL LOW (ref 13.0–17.0)
Hemoglobin: 10.2 g/dL — ABNORMAL LOW (ref 13.0–17.0)
MCH: 30.3 pg (ref 26.0–34.0)
MCH: 29.1 pg (ref 26.0–34.0)
MCH: 29.1 pg (ref 26.0–34.0)
MCHC: 33.5 g/dL (ref 30.0–36.0)
MCHC: 33.1 g/dL (ref 30.0–36.0)
MCHC: 33.6 g/dL (ref 30.0–36.0)
MCV: 90.7 fL (ref 78.0–100.0)
MCV: 87.9 fL (ref 78.0–100.0)
MCV: 86.6 fL (ref 78.0–100.0)
Platelets: 296 10*3/uL (ref 150–400)
Platelets: 174 10*3/uL (ref 150–400)
Platelets: 180 10*3/uL (ref 150–400)
RBC: 3 MIL/uL — ABNORMAL LOW (ref 4.22–5.81)
RBC: 3.54 MIL/uL — ABNORMAL LOW (ref 4.22–5.81)
RBC: 3.51 MIL/uL — ABNORMAL LOW (ref 4.22–5.81)
RDW: 13.4 % (ref 11.5–15.5)
RDW: 13.5 % (ref 11.5–15.5)
RDW: 14.2 % (ref 11.5–15.5)
WBC: 10.6 10*3/uL — ABNORMAL HIGH (ref 4.0–10.5)
WBC: 23.3 10*3/uL — ABNORMAL HIGH (ref 4.0–10.5)
WBC: 18.2 10*3/uL — ABNORMAL HIGH (ref 4.0–10.5)

## 2017-03-22 LAB — POCT I-STAT, CHEM 8
BUN: 30 mg/dL — ABNORMAL HIGH (ref 6–20)
BUN: 28 mg/dL — ABNORMAL HIGH (ref 6–20)
BUN: 26 mg/dL — ABNORMAL HIGH (ref 6–20)
BUN: 28 mg/dL — ABNORMAL HIGH (ref 6–20)
BUN: 28 mg/dL — ABNORMAL HIGH (ref 6–20)
BUN: 27 mg/dL — ABNORMAL HIGH (ref 6–20)
Calcium, Ion: 1.17 mmol/L (ref 1.15–1.40)
Calcium, Ion: 1.17 mmol/L (ref 1.15–1.40)
Calcium, Ion: 0.99 mmol/L — ABNORMAL LOW (ref 1.15–1.40)
Calcium, Ion: 1.08 mmol/L — ABNORMAL LOW (ref 1.15–1.40)
Calcium, Ion: 1.19 mmol/L (ref 1.15–1.40)
Calcium, Ion: 1.23 mmol/L (ref 1.15–1.40)
Chloride: 103 mmol/L (ref 101–111)
Chloride: 104 mmol/L (ref 101–111)
Chloride: 101 mmol/L (ref 101–111)
Chloride: 103 mmol/L (ref 101–111)
Chloride: 103 mmol/L (ref 101–111)
Chloride: 104 mmol/L (ref 101–111)
Creatinine, Ser: 1.6 mg/dL — ABNORMAL HIGH (ref 0.61–1.24)
Creatinine, Ser: 1.5 mg/dL — ABNORMAL HIGH (ref 0.61–1.24)
Creatinine, Ser: 1.3 mg/dL — ABNORMAL HIGH (ref 0.61–1.24)
Creatinine, Ser: 1.5 mg/dL — ABNORMAL HIGH (ref 0.61–1.24)
Creatinine, Ser: 1.4 mg/dL — ABNORMAL HIGH (ref 0.61–1.24)
Creatinine, Ser: 1.6 mg/dL — ABNORMAL HIGH (ref 0.61–1.24)
Glucose, Bld: 156 mg/dL — ABNORMAL HIGH (ref 65–99)
Glucose, Bld: 122 mg/dL — ABNORMAL HIGH (ref 65–99)
Glucose, Bld: 131 mg/dL — ABNORMAL HIGH (ref 65–99)
Glucose, Bld: 123 mg/dL — ABNORMAL HIGH (ref 65–99)
Glucose, Bld: 123 mg/dL — ABNORMAL HIGH (ref 65–99)
Glucose, Bld: 148 mg/dL — ABNORMAL HIGH (ref 65–99)
HCT: 22 % — ABNORMAL LOW (ref 39.0–52.0)
HCT: 19 % — ABNORMAL LOW (ref 39.0–52.0)
HCT: 21 % — ABNORMAL LOW (ref 39.0–52.0)
HCT: 22 % — ABNORMAL LOW (ref 39.0–52.0)
HCT: 23 % — ABNORMAL LOW (ref 39.0–52.0)
HCT: 27 % — ABNORMAL LOW (ref 39.0–52.0)
Hemoglobin: 7.5 g/dL — ABNORMAL LOW (ref 13.0–17.0)
Hemoglobin: 6.5 g/dL — CL (ref 13.0–17.0)
Hemoglobin: 7.1 g/dL — ABNORMAL LOW (ref 13.0–17.0)
Hemoglobin: 7.5 g/dL — ABNORMAL LOW (ref 13.0–17.0)
Hemoglobin: 7.8 g/dL — ABNORMAL LOW (ref 13.0–17.0)
Hemoglobin: 9.2 g/dL — ABNORMAL LOW (ref 13.0–17.0)
Potassium: 4.4 mmol/L (ref 3.5–5.1)
Potassium: 4.4 mmol/L (ref 3.5–5.1)
Potassium: 4.6 mmol/L (ref 3.5–5.1)
Potassium: 5.3 mmol/L — ABNORMAL HIGH (ref 3.5–5.1)
Potassium: 4.8 mmol/L (ref 3.5–5.1)
Potassium: 4.4 mmol/L (ref 3.5–5.1)
Sodium: 139 mmol/L (ref 135–145)
Sodium: 140 mmol/L (ref 135–145)
Sodium: 137 mmol/L (ref 135–145)
Sodium: 137 mmol/L (ref 135–145)
Sodium: 139 mmol/L (ref 135–145)
Sodium: 137 mmol/L (ref 135–145)
TCO2: 25 mmol/L (ref 0–100)
TCO2: 25 mmol/L (ref 0–100)
TCO2: 27 mmol/L (ref 0–100)
TCO2: 26 mmol/L (ref 0–100)
TCO2: 25 mmol/L (ref 0–100)
TCO2: 25 mmol/L (ref 0–100)

## 2017-03-22 LAB — POCT I-STAT 3, ART BLOOD GAS (G3+)
Acid-Base Excess: 2 mmol/L (ref 0.0–2.0)
Bicarbonate: 27.5 mmol/L (ref 20.0–28.0)
Bicarbonate: 24.3 mmol/L (ref 20.0–28.0)
Bicarbonate: 24.7 mmol/L (ref 20.0–28.0)
O2 Saturation: 100 %
O2 Saturation: 89 %
O2 Saturation: 98 %
Patient temperature: 35.8
Patient temperature: 36.2
TCO2: 29 mmol/L (ref 0–100)
TCO2: 25 mmol/L (ref 0–100)
TCO2: 26 mmol/L (ref 0–100)
pCO2 arterial: 48 mmHg (ref 32.0–48.0)
pCO2 arterial: 35.3 mmHg (ref 32.0–48.0)
pCO2 arterial: 36.2 mmHg (ref 32.0–48.0)
pH, Arterial: 7.366 (ref 7.350–7.450)
pH, Arterial: 7.442 (ref 7.350–7.450)
pH, Arterial: 7.438 (ref 7.350–7.450)
pO2, Arterial: 319 mmHg — ABNORMAL HIGH (ref 83.0–108.0)
pO2, Arterial: 51 mmHg — ABNORMAL LOW (ref 83.0–108.0)
pO2, Arterial: 93 mmHg (ref 83.0–108.0)

## 2017-03-22 LAB — POCT I-STAT 4, (NA,K, GLUC, HGB,HCT)
Glucose, Bld: 132 mg/dL — ABNORMAL HIGH (ref 65–99)
HCT: 29 % — ABNORMAL LOW (ref 39.0–52.0)
Hemoglobin: 9.9 g/dL — ABNORMAL LOW (ref 13.0–17.0)
Potassium: 5 mmol/L (ref 3.5–5.1)
Sodium: 139 mmol/L (ref 135–145)

## 2017-03-22 LAB — CREATININE, SERUM
Creatinine, Ser: 1.56 mg/dL — ABNORMAL HIGH (ref 0.61–1.24)
GFR calc Af Amer: 54 mL/min — ABNORMAL LOW (ref >60)
GFR calc non Af Amer: 47 mL/min — ABNORMAL LOW (ref >60)

## 2017-03-22 LAB — BASIC METABOLIC PANEL
Anion gap: 10 (ref 5–15)
BUN: 33 mg/dL — ABNORMAL HIGH (ref 6–20)
CO2: 25 mmol/L (ref 22–32)
Calcium: 8.3 mg/dL — ABNORMAL LOW (ref 8.9–10.3)
Chloride: 102 mmol/L (ref 101–111)
Creatinine, Ser: 1.85 mg/dL — ABNORMAL HIGH (ref 0.61–1.24)
GFR calc Af Amer: 44 mL/min — ABNORMAL LOW (ref >60)
GFR calc non Af Amer: 38 mL/min — ABNORMAL LOW (ref >60)
Glucose, Bld: 157 mg/dL — ABNORMAL HIGH (ref 65–99)
Potassium: 4.4 mmol/L (ref 3.5–5.1)
Sodium: 137 mmol/L (ref 135–145)

## 2017-03-22 LAB — GLUCOSE, CAPILLARY
Glucose-Capillary: 156 mg/dL — ABNORMAL HIGH (ref 65–99)
Glucose-Capillary: 134 mg/dL — ABNORMAL HIGH (ref 65–99)
Glucose-Capillary: 133 mg/dL — ABNORMAL HIGH (ref 65–99)
Glucose-Capillary: 129 mg/dL — ABNORMAL HIGH (ref 65–99)
Glucose-Capillary: 119 mg/dL — ABNORMAL HIGH (ref 65–99)
Glucose-Capillary: 141 mg/dL — ABNORMAL HIGH (ref 65–99)
Glucose-Capillary: 121 mg/dL — ABNORMAL HIGH (ref 65–99)
Glucose-Capillary: 106 mg/dL — ABNORMAL HIGH (ref 65–99)

## 2017-03-22 LAB — ECHO TEE
Annulus: 2.5 cm
Ao-prox: 3.4 cm
LV PW d: 0.89 mm (ref 0.6–1.1)
LVIDD: 5 cm
STJ: 3.2 cm

## 2017-03-22 LAB — MAGNESIUM: Magnesium: 2.9 mg/dL — ABNORMAL HIGH (ref 1.7–2.4)

## 2017-03-22 LAB — APTT
aPTT: 30 seconds (ref 24–36)
aPTT: 30 seconds (ref 24–36)

## 2017-03-22 LAB — HEMOGLOBIN A1C
Hgb A1c MFr Bld: 6.3 % — ABNORMAL HIGH (ref 4.8–5.6)
Mean Plasma Glucose: 134 mg/dL

## 2017-03-22 LAB — HEMOGLOBIN AND HEMATOCRIT, BLOOD
HCT: 22.2 % — ABNORMAL LOW (ref 39.0–52.0)
Hemoglobin: 7.5 g/dL — ABNORMAL LOW (ref 13.0–17.0)

## 2017-03-22 LAB — PLATELET COUNT: Platelets: 181 10*3/uL (ref 150–400)

## 2017-03-22 LAB — PROTIME-INR
INR: 1.28
Prothrombin Time: 16.1 seconds — ABNORMAL HIGH (ref 11.4–15.2)

## 2017-03-22 SURGERY — CORONARY ARTERY BYPASS GRAFTING (CABG)
Anesthesia: General | Site: Chest

## 2017-03-22 MED ORDER — SODIUM CHLORIDE 0.9 % IV SOLN
20.0000 ug | INTRAVENOUS | Status: AC
  Administered 2017-03-22: 20 ug via INTRAVENOUS
  Filled 2017-03-22: qty 5

## 2017-03-22 MED ORDER — MIDAZOLAM HCL 2 MG/2ML IJ SOLN
INTRAMUSCULAR | Status: AC
  Filled 2017-03-22: qty 2

## 2017-03-22 MED ORDER — ACETAMINOPHEN 160 MG/5ML PO SOLN: 1000.0000 mg | Freq: Four times a day (QID) | ORAL | Status: DC

## 2017-03-22 MED ORDER — SODIUM CHLORIDE 0.9 % IV SOLN: 250.0000 mL | INTRAVENOUS | Status: DC

## 2017-03-22 MED ORDER — HEPARIN SODIUM (PORCINE) 1000 UNIT/ML IJ SOLN
INTRAMUSCULAR | Status: DC | PRN
  Administered 2017-03-22: 32000 [IU] via INTRAVENOUS
  Administered 2017-03-22: 3000 [IU] via INTRAVENOUS

## 2017-03-22 MED ORDER — TRAMADOL HCL 50 MG PO TABS: 50.0000 mg | ORAL_TABLET | ORAL | Status: DC | PRN

## 2017-03-22 MED ORDER — ALBUMIN HUMAN 5 % IV SOLN
INTRAVENOUS | Status: DC | PRN
  Administered 2017-03-22: 13:00:00 via INTRAVENOUS

## 2017-03-22 MED ORDER — ARTIFICIAL TEARS OPHTHALMIC OINT
TOPICAL_OINTMENT | OPHTHALMIC | Status: AC
  Filled 2017-03-22: qty 3.5

## 2017-03-22 MED ORDER — ROCURONIUM BROMIDE 10 MG/ML (PF) SYRINGE
PREFILLED_SYRINGE | INTRAVENOUS | Status: DC | PRN
  Administered 2017-03-22: 40 mg via INTRAVENOUS
  Administered 2017-03-22 (×3): 50 mg via INTRAVENOUS
  Administered 2017-03-22: 60 mg via INTRAVENOUS
  Administered 2017-03-22: 50 mg via INTRAVENOUS

## 2017-03-22 MED ORDER — LIDOCAINE 2% (20 MG/ML) 5 ML SYRINGE
INTRAMUSCULAR | Status: AC
  Filled 2017-03-22: qty 5

## 2017-03-22 MED ORDER — METHYLPREDNISOLONE SODIUM SUCC 125 MG IJ SOLR
80.0000 mg | Freq: Every day | INTRAMUSCULAR | Status: AC
  Administered 2017-03-22: 80 mg via INTRAVENOUS
  Filled 2017-03-22: qty 2

## 2017-03-22 MED ORDER — PHENYLEPHRINE HCL 10 MG/ML IJ SOLN
INTRAMUSCULAR | Status: DC | PRN
  Administered 2017-03-22: 20 ug/min via INTRAVENOUS

## 2017-03-22 MED ORDER — 0.9 % SODIUM CHLORIDE (POUR BTL) OPTIME
TOPICAL | Status: DC | PRN
  Administered 2017-03-22: 6000 mL

## 2017-03-22 MED ORDER — HEPARIN SODIUM (PORCINE) 1000 UNIT/ML IJ SOLN
INTRAMUSCULAR | Status: AC
  Filled 2017-03-22: qty 1

## 2017-03-22 MED ORDER — HEMOSTATIC AGENTS (NO CHARGE) OPTIME
TOPICAL | Status: DC | PRN
  Administered 2017-03-22 (×2): 1 via TOPICAL

## 2017-03-22 MED ORDER — METOPROLOL TARTRATE 25 MG/10 ML ORAL SUSPENSION: 12.5000 mg | Freq: Two times a day (BID) | ORAL | Status: DC

## 2017-03-22 MED ORDER — FAMOTIDINE IN NACL 20-0.9 MG/50ML-% IV SOLN
20.0000 mg | Freq: Two times a day (BID) | INTRAVENOUS | Status: AC
  Administered 2017-03-22 (×2): 20 mg via INTRAVENOUS
  Filled 2017-03-22: qty 50

## 2017-03-22 MED ORDER — DOCUSATE SODIUM 100 MG PO CAPS
200.0000 mg | ORAL_CAPSULE | Freq: Every day | ORAL | Status: DC
  Administered 2017-03-23 – 2017-03-27 (×5): 200 mg via ORAL
  Filled 2017-03-22 (×5): qty 2

## 2017-03-22 MED ORDER — ONDANSETRON HCL 4 MG/2ML IJ SOLN
4.0000 mg | Freq: Four times a day (QID) | INTRAMUSCULAR | Status: DC | PRN
  Administered 2017-03-23: 4 mg via INTRAVENOUS
  Filled 2017-03-22: qty 2

## 2017-03-22 MED ORDER — SODIUM CHLORIDE 0.9 % IJ SOLN
OROMUCOSAL | Status: DC | PRN
  Administered 2017-03-22: 09:00:00 via TOPICAL

## 2017-03-22 MED ORDER — MIDAZOLAM HCL 2 MG/2ML IJ SOLN
2.0000 mg | INTRAMUSCULAR | Status: DC | PRN
  Administered 2017-03-22 (×2): 2 mg via INTRAVENOUS
  Filled 2017-03-22 (×2): qty 2

## 2017-03-22 MED ORDER — VANCOMYCIN HCL IN DEXTROSE 1-5 GM/200ML-% IV SOLN
1000.0000 mg | Freq: Once | INTRAVENOUS | Status: AC
  Administered 2017-03-22: 1000 mg via INTRAVENOUS
  Filled 2017-03-22: qty 200

## 2017-03-22 MED ORDER — LACTATED RINGERS IV SOLN
INTRAVENOUS | Status: DC | PRN
  Administered 2017-03-22 (×2): via INTRAVENOUS

## 2017-03-22 MED ORDER — PROTAMINE SULFATE 10 MG/ML IV SOLN
INTRAVENOUS | Status: AC
  Filled 2017-03-22: qty 5

## 2017-03-22 MED ORDER — SODIUM CHLORIDE 0.9 % IV SOLN: 10.0000 mL/h | Freq: Once | INTRAVENOUS | Status: DC

## 2017-03-22 MED ORDER — MILRINONE LACTATE IN DEXTROSE 20-5 MG/100ML-% IV SOLN
0.1250 ug/kg/min | INTRAVENOUS | Status: AC
  Administered 2017-03-22 (×2): 0.3 ug/kg/min via INTRAVENOUS
  Administered 2017-03-23 – 2017-03-24 (×2): 0.25 ug/kg/min via INTRAVENOUS
  Filled 2017-03-22 (×4): qty 100

## 2017-03-22 MED ORDER — DEXMEDETOMIDINE HCL IN NACL 400 MCG/100ML IV SOLN
0.0000 ug/kg/h | INTRAVENOUS | Status: DC
  Filled 2017-03-22: qty 100

## 2017-03-22 MED ORDER — ASPIRIN EC 325 MG PO TBEC
325.0000 mg | DELAYED_RELEASE_TABLET | Freq: Every day | ORAL | Status: DC
  Administered 2017-03-23: 325 mg via ORAL
  Filled 2017-03-22: qty 1

## 2017-03-22 MED ORDER — SODIUM CHLORIDE 0.9 % IV SOLN
0.0000 ug/min | INTRAVENOUS | Status: DC
  Filled 2017-03-22: qty 2

## 2017-03-22 MED ORDER — ACETAMINOPHEN 500 MG PO TABS
1000.0000 mg | ORAL_TABLET | Freq: Four times a day (QID) | ORAL | Status: DC
  Administered 2017-03-23 – 2017-03-27 (×15): 1000 mg via ORAL
  Filled 2017-03-22 (×15): qty 2

## 2017-03-22 MED ORDER — LACTATED RINGERS IV SOLN
INTRAVENOUS | Status: DC
  Administered 2017-03-22: 15:00:00 via INTRAVENOUS

## 2017-03-22 MED ORDER — PHENYLEPHRINE 40 MCG/ML (10ML) SYRINGE FOR IV PUSH (FOR BLOOD PRESSURE SUPPORT)
PREFILLED_SYRINGE | INTRAVENOUS | Status: AC
  Filled 2017-03-22: qty 30

## 2017-03-22 MED ORDER — FENTANYL CITRATE (PF) 250 MCG/5ML IJ SOLN
INTRAMUSCULAR | Status: AC
  Filled 2017-03-22: qty 5

## 2017-03-22 MED ORDER — ACETAMINOPHEN 160 MG/5ML PO SOLN: 650.0000 mg | Freq: Once | ORAL | Status: AC

## 2017-03-22 MED ORDER — METOPROLOL TARTRATE 5 MG/5ML IV SOLN: 2.5000 mg | INTRAVENOUS | Status: DC | PRN

## 2017-03-22 MED ORDER — DEXTROSE 5 % IV SOLN
1.5000 g | Freq: Two times a day (BID) | INTRAVENOUS | Status: AC
  Administered 2017-03-22 – 2017-03-24 (×4): 1.5 g via INTRAVENOUS
  Filled 2017-03-22 (×4): qty 1.5

## 2017-03-22 MED ORDER — FENTANYL CITRATE (PF) 250 MCG/5ML IJ SOLN
INTRAMUSCULAR | Status: AC
  Filled 2017-03-22: qty 25

## 2017-03-22 MED ORDER — HEMOSTATIC AGENTS (NO CHARGE) OPTIME
TOPICAL | Status: DC | PRN
  Administered 2017-03-22: 1 via TOPICAL

## 2017-03-22 MED ORDER — MORPHINE SULFATE (PF) 4 MG/ML IV SOLN
1.0000 mg | INTRAVENOUS | Status: AC | PRN
  Administered 2017-03-22 (×2): 4 mg via INTRAVENOUS
  Filled 2017-03-22 (×3): qty 1

## 2017-03-22 MED ORDER — ACETAMINOPHEN 650 MG RE SUPP
650.0000 mg | Freq: Once | RECTAL | Status: AC
  Administered 2017-03-22: 650 mg via RECTAL

## 2017-03-22 MED ORDER — LACTATED RINGERS IV SOLN: 500.0000 mL | Freq: Once | INTRAVENOUS | Status: DC | PRN

## 2017-03-22 MED ORDER — MIDAZOLAM HCL 5 MG/5ML IJ SOLN
INTRAMUSCULAR | Status: DC | PRN
  Administered 2017-03-22: 3 mg via INTRAVENOUS
  Administered 2017-03-22: 1 mg via INTRAVENOUS
  Administered 2017-03-22 (×4): 2 mg via INTRAVENOUS

## 2017-03-22 MED ORDER — HYDRALAZINE HCL 20 MG/ML IJ SOLN: 10.0000 mg | INTRAMUSCULAR | Status: DC | PRN

## 2017-03-22 MED ORDER — PROPOFOL 10 MG/ML IV BOLUS
INTRAVENOUS | Status: DC | PRN
  Administered 2017-03-22: 80 mg via INTRAVENOUS

## 2017-03-22 MED ORDER — MORPHINE SULFATE (PF) 4 MG/ML IV SOLN: 2.0000 mg | INTRAVENOUS | Status: AC | PRN

## 2017-03-22 MED ORDER — MAGNESIUM SULFATE 4 GM/100ML IV SOLN
4.0000 g | Freq: Once | INTRAVENOUS | Status: AC
  Administered 2017-03-22: 4 g via INTRAVENOUS
  Filled 2017-03-22: qty 100

## 2017-03-22 MED ORDER — METOPROLOL TARTRATE 12.5 MG HALF TABLET: 12.5000 mg | ORAL_TABLET | Freq: Two times a day (BID) | ORAL | Status: DC

## 2017-03-22 MED ORDER — SODIUM CHLORIDE 0.9 % IJ SOLN
INTRAMUSCULAR | Status: AC
  Filled 2017-03-22: qty 10

## 2017-03-22 MED ORDER — PANTOPRAZOLE SODIUM 40 MG PO TBEC
40.0000 mg | DELAYED_RELEASE_TABLET | Freq: Every day | ORAL | Status: DC
  Administered 2017-03-24 – 2017-03-27 (×4): 40 mg via ORAL
  Filled 2017-03-22 (×4): qty 1

## 2017-03-22 MED ORDER — PLASMA-LYTE 148 IV SOLN
INTRAVENOUS | Status: DC | PRN
  Administered 2017-03-22: 500 mL

## 2017-03-22 MED ORDER — SODIUM CHLORIDE 0.45 % IV SOLN
INTRAVENOUS | Status: DC | PRN
  Administered 2017-03-22 (×2): via INTRAVENOUS

## 2017-03-22 MED ORDER — ARTIFICIAL TEARS OPHTHALMIC OINT
TOPICAL_OINTMENT | OPHTHALMIC | Status: DC | PRN
  Administered 2017-03-22: 1 via OPHTHALMIC

## 2017-03-22 MED ORDER — LACTATED RINGERS IV SOLN
INTRAVENOUS | Status: DC | PRN
  Administered 2017-03-22: 07:00:00 via INTRAVENOUS

## 2017-03-22 MED ORDER — SODIUM CHLORIDE 0.9 % IV SOLN: INTRAVENOUS | Status: DC

## 2017-03-22 MED ORDER — BISACODYL 5 MG PO TBEC
10.0000 mg | DELAYED_RELEASE_TABLET | Freq: Every day | ORAL | Status: DC
  Administered 2017-03-23 – 2017-03-27 (×5): 10 mg via ORAL
  Filled 2017-03-22 (×5): qty 2

## 2017-03-22 MED ORDER — OXYCODONE HCL 5 MG PO TABS
5.0000 mg | ORAL_TABLET | ORAL | Status: DC | PRN
  Administered 2017-03-23: 5 mg via ORAL
  Administered 2017-03-24 – 2017-03-26 (×5): 10 mg via ORAL
  Filled 2017-03-22 (×3): qty 2
  Filled 2017-03-22: qty 1
  Filled 2017-03-22 (×2): qty 2

## 2017-03-22 MED ORDER — CHLORHEXIDINE GLUCONATE 0.12% ORAL RINSE (MEDLINE KIT)
15.0000 mL | Freq: Two times a day (BID) | OROMUCOSAL | Status: DC
  Administered 2017-03-22: 15 mL via OROMUCOSAL

## 2017-03-22 MED ORDER — SODIUM CHLORIDE 0.9% FLUSH
3.0000 mL | Freq: Two times a day (BID) | INTRAVENOUS | Status: DC
  Administered 2017-03-23 – 2017-03-26 (×7): 3 mL via INTRAVENOUS

## 2017-03-22 MED ORDER — FENTANYL CITRATE (PF) 250 MCG/5ML IJ SOLN
INTRAMUSCULAR | Status: DC | PRN
  Administered 2017-03-22: 100 ug via INTRAVENOUS
  Administered 2017-03-22: 50 ug via INTRAVENOUS
  Administered 2017-03-22: 150 ug via INTRAVENOUS
  Administered 2017-03-22: 50 ug via INTRAVENOUS
  Administered 2017-03-22: 150 ug via INTRAVENOUS
  Administered 2017-03-22: 100 ug via INTRAVENOUS
  Administered 2017-03-22 (×2): 50 ug via INTRAVENOUS
  Administered 2017-03-22 (×3): 100 ug via INTRAVENOUS
  Administered 2017-03-22: 50 ug via INTRAVENOUS
  Administered 2017-03-22: 150 ug via INTRAVENOUS
  Administered 2017-03-22 (×2): 100 ug via INTRAVENOUS
  Administered 2017-03-22 (×2): 50 ug via INTRAVENOUS

## 2017-03-22 MED ORDER — ASPIRIN 81 MG PO CHEW: 324.0000 mg | CHEWABLE_TABLET | Freq: Every day | ORAL | Status: DC

## 2017-03-22 MED ORDER — INSULIN REGULAR HUMAN 100 UNIT/ML IJ SOLN
INTRAMUSCULAR | Status: DC
  Administered 2017-03-22: 1.4 [IU]/h via INTRAVENOUS
  Filled 2017-03-22: qty 1

## 2017-03-22 MED ORDER — ROCURONIUM BROMIDE 10 MG/ML (PF) SYRINGE
PREFILLED_SYRINGE | INTRAVENOUS | Status: AC
  Filled 2017-03-22: qty 15

## 2017-03-22 MED ORDER — SODIUM CHLORIDE 0.9 % IV SOLN
0.0000 ug/kg/h | INTRAVENOUS | Status: DC
  Administered 2017-03-22 (×2): 0.7 ug/kg/h via INTRAVENOUS
  Filled 2017-03-22 (×2): qty 2

## 2017-03-22 MED ORDER — MILRINONE LACTATE IN DEXTROSE 20-5 MG/100ML-% IV SOLN
0.3750 ug/kg/min | INTRAVENOUS | Status: AC
  Administered 2017-03-22: 50 mg/kg/min via INTRAVENOUS
  Filled 2017-03-22: qty 100

## 2017-03-22 MED ORDER — CHLORHEXIDINE GLUCONATE 0.12 % MT SOLN
15.0000 mL | OROMUCOSAL | Status: AC
  Administered 2017-03-22: 15 mL via OROMUCOSAL

## 2017-03-22 MED ORDER — INSULIN REGULAR BOLUS VIA INFUSION
0.0000 [IU] | Freq: Three times a day (TID) | INTRAVENOUS | Status: DC
  Filled 2017-03-22: qty 10

## 2017-03-22 MED ORDER — BISACODYL 10 MG RE SUPP: 10.0000 mg | Freq: Every day | RECTAL | Status: DC

## 2017-03-22 MED ORDER — ALBUMIN HUMAN 5 % IV SOLN
250.0000 mL | INTRAVENOUS | Status: AC | PRN
  Administered 2017-03-22: 250 mL via INTRAVENOUS

## 2017-03-22 MED ORDER — MIDAZOLAM HCL 10 MG/2ML IJ SOLN
INTRAMUSCULAR | Status: AC
  Filled 2017-03-22: qty 2

## 2017-03-22 MED ORDER — LACTATED RINGERS IV SOLN
INTRAVENOUS | Status: DC | PRN
  Administered 2017-03-22 (×2): via INTRAVENOUS

## 2017-03-22 MED ORDER — POTASSIUM CHLORIDE 10 MEQ/50ML IV SOLN: 10.0000 meq | INTRAVENOUS | Status: AC

## 2017-03-22 MED ORDER — PROTAMINE SULFATE 10 MG/ML IV SOLN
INTRAVENOUS | Status: DC | PRN
  Administered 2017-03-22: 100 mg via INTRAVENOUS
  Administered 2017-03-22: 65 mg via INTRAVENOUS
  Administered 2017-03-22: 110 mg via INTRAVENOUS
  Administered 2017-03-22: 35 mg via INTRAVENOUS

## 2017-03-22 MED ORDER — PHENYLEPHRINE HCL 10 MG/ML IJ SOLN
INTRAMUSCULAR | Status: DC | PRN
  Administered 2017-03-22 (×2): 40 ug via INTRAVENOUS

## 2017-03-22 MED ORDER — NITROGLYCERIN IN D5W 200-5 MCG/ML-% IV SOLN
0.0000 ug/min | INTRAVENOUS | Status: DC
  Administered 2017-03-22: 100 ug/min via INTRAVENOUS

## 2017-03-22 MED ORDER — PROTAMINE SULFATE 10 MG/ML IV SOLN
INTRAVENOUS | Status: AC
  Filled 2017-03-22: qty 25

## 2017-03-22 MED ORDER — SODIUM CHLORIDE 0.9% FLUSH: 3.0000 mL | INTRAVENOUS | Status: DC | PRN

## 2017-03-22 MED ORDER — ORAL CARE MOUTH RINSE
15.0000 mL | Freq: Four times a day (QID) | OROMUCOSAL | Status: DC
  Administered 2017-03-22 – 2017-03-23 (×3): 15 mL via OROMUCOSAL

## 2017-03-22 MED FILL — Mannitol IV Soln 20%: INTRAVENOUS | Qty: 500 | Status: AC

## 2017-03-22 MED FILL — Heparin Sodium (Porcine) Inj 1000 Unit/ML: INTRAMUSCULAR | Qty: 10 | Status: AC

## 2017-03-22 MED FILL — Sodium Chloride IV Soln 0.9%: INTRAVENOUS | Qty: 3000 | Status: AC

## 2017-03-22 MED FILL — Lidocaine HCl IV Inj 20 MG/ML: INTRAVENOUS | Qty: 5 | Status: AC

## 2017-03-22 MED FILL — Sodium Bicarbonate IV Soln 8.4%: INTRAVENOUS | Qty: 50 | Status: AC

## 2017-03-22 MED FILL — Electrolyte-R (PH 7.4) Solution: INTRAVENOUS | Qty: 4000 | Status: AC

## 2017-03-22 MED FILL — Calcium Chloride Inj 10%: INTRAVENOUS | Qty: 10 | Status: AC

## 2017-03-22 SURGICAL SUPPLY — 111 items
ADAPTER CARDIO PERF ANTE/RETRO (ADAPTER) ×4 IMPLANT
BAG DECANTER FOR FLEXI CONT (MISCELLANEOUS) ×4 IMPLANT
BANDAGE ACE 4X5 VEL STRL LF (GAUZE/BANDAGES/DRESSINGS) ×4 IMPLANT
BANDAGE ACE 6X5 VEL STRL LF (GAUZE/BANDAGES/DRESSINGS) ×4 IMPLANT
BASKET HEART  (ORDER IN 25'S) (MISCELLANEOUS) ×1
BASKET HEART (ORDER IN 25'S) (MISCELLANEOUS) ×1
BASKET HEART (ORDER IN 25S) (MISCELLANEOUS) ×2 IMPLANT
BLADE CLIPPER SURG (BLADE) IMPLANT
BLADE STERNUM SYSTEM 6 (BLADE) ×4 IMPLANT
BLADE SURG 12 STRL SS (BLADE) ×4 IMPLANT
BNDG GAUZE ELAST 4 BULKY (GAUZE/BANDAGES/DRESSINGS) ×4 IMPLANT
CANISTER SUCT 3000ML PPV (MISCELLANEOUS) ×4 IMPLANT
CANNULA GUNDRY RCSP 15FR (MISCELLANEOUS) ×4 IMPLANT
CATH CPB KIT VANTRIGT (MISCELLANEOUS) ×4 IMPLANT
CATH ROBINSON RED A/P 18FR (CATHETERS) ×12 IMPLANT
CATH THORACIC 36FR RT ANG (CATHETERS) ×4 IMPLANT
CLIP FOGARTY SPRING 6M (CLIP) ×4 IMPLANT
CLIP TI WIDE RED SMALL 24 (CLIP) ×8 IMPLANT
COVER BACK TABLE 60X90IN (DRAPES) ×4 IMPLANT
CRADLE DONUT ADULT HEAD (MISCELLANEOUS) ×4 IMPLANT
DRAIN CHANNEL 32F RND 10.7 FF (WOUND CARE) ×4 IMPLANT
DRAPE CARDIOVASCULAR INCISE (DRAPES) ×2
DRAPE SLUSH/WARMER DISC (DRAPES) ×4 IMPLANT
DRAPE SRG 135X102X78XABS (DRAPES) ×2 IMPLANT
DRSG AQUACEL AG ADV 3.5X14 (GAUZE/BANDAGES/DRESSINGS) ×4 IMPLANT
DRSG COVADERM 4X14 (GAUZE/BANDAGES/DRESSINGS) ×4 IMPLANT
ELECT BLADE 4.0 EZ CLEAN MEGAD (MISCELLANEOUS) ×4
ELECT BLADE 6.5 EXT (BLADE) ×4 IMPLANT
ELECT CAUTERY BLADE 6.4 (BLADE) ×4 IMPLANT
ELECT REM PT RETURN 9FT ADLT (ELECTROSURGICAL) ×8
ELECTRODE BLDE 4.0 EZ CLN MEGD (MISCELLANEOUS) ×2 IMPLANT
ELECTRODE REM PT RTRN 9FT ADLT (ELECTROSURGICAL) ×4 IMPLANT
FELT TEFLON 1X6 (MISCELLANEOUS) ×4 IMPLANT
GAUZE SPONGE 4X4 12PLY STRL (GAUZE/BANDAGES/DRESSINGS) ×12 IMPLANT
GAUZE SPONGE 4X4 16PLY XRAY LF (GAUZE/BANDAGES/DRESSINGS) ×4 IMPLANT
GLOVE BIO SURGEON STRL SZ7.5 (GLOVE) ×12 IMPLANT
GLOVE BIOGEL M 7.0 STRL (GLOVE) ×16 IMPLANT
GLOVE BIOGEL M STER SZ 6 (GLOVE) ×8 IMPLANT
GLOVE BIOGEL M STRL SZ7.5 (GLOVE) ×8 IMPLANT
GLOVE BIOGEL PI IND STRL 6 (GLOVE) ×2 IMPLANT
GLOVE BIOGEL PI IND STRL 6.5 (GLOVE) ×4 IMPLANT
GLOVE BIOGEL PI IND STRL 7.0 (GLOVE) ×2 IMPLANT
GLOVE BIOGEL PI INDICATOR 6 (GLOVE) ×2
GLOVE BIOGEL PI INDICATOR 6.5 (GLOVE) ×4
GLOVE BIOGEL PI INDICATOR 7.0 (GLOVE) ×2
GOWN STRL REUS W/ TWL LRG LVL3 (GOWN DISPOSABLE) ×20 IMPLANT
GOWN STRL REUS W/TWL LRG LVL3 (GOWN DISPOSABLE) ×20
GRAFT VASC ANGIOGRAFT 61-70 (Tissue) ×2 IMPLANT
HEMOSTAT POWDER SURGIFOAM 1G (HEMOSTASIS) ×12 IMPLANT
HEMOSTAT SURGICEL 2X14 (HEMOSTASIS) ×4 IMPLANT
INSERT FOGARTY XLG (MISCELLANEOUS) IMPLANT
KIT BASIN OR (CUSTOM PROCEDURE TRAY) ×4 IMPLANT
KIT ROOM TURNOVER OR (KITS) ×4 IMPLANT
KIT SUCTION CATH 14FR (SUCTIONS) ×4 IMPLANT
KIT VASOVIEW HEMOPRO VH 3000 (KITS) ×4 IMPLANT
LEAD PACING MYOCARDI (MISCELLANEOUS) ×4 IMPLANT
MARKER GRAFT CORONARY BYPASS (MISCELLANEOUS) ×12 IMPLANT
NS IRRIG 1000ML POUR BTL (IV SOLUTION) ×24 IMPLANT
PACK OPEN HEART (CUSTOM PROCEDURE TRAY) ×4 IMPLANT
PAD ARMBOARD 7.5X6 YLW CONV (MISCELLANEOUS) ×8 IMPLANT
PAD ELECT DEFIB RADIOL ZOLL (MISCELLANEOUS) ×4 IMPLANT
PENCIL BUTTON HOLSTER BLD 10FT (ELECTRODE) ×4 IMPLANT
PUNCH AORTIC ROTATE 4.0MM (MISCELLANEOUS) IMPLANT
PUNCH AORTIC ROTATE 4.5MM 8IN (MISCELLANEOUS) IMPLANT
PUNCH AORTIC ROTATE 5MM 8IN (MISCELLANEOUS) IMPLANT
SET CARDIOPLEGIA MPS 5001102 (MISCELLANEOUS) ×4 IMPLANT
SPONGE LAP 18X18 X RAY DECT (DISPOSABLE) ×8 IMPLANT
SPONGE LAP 4X18 X RAY DECT (DISPOSABLE) ×4 IMPLANT
STOPCOCK 4 WAY LG BORE MALE ST (IV SETS) ×4 IMPLANT
SURGIFLO W/THROMBIN 8M KIT (HEMOSTASIS) ×12 IMPLANT
SUT BONE WAX W31G (SUTURE) ×4 IMPLANT
SUT MNCRL AB 4-0 PS2 18 (SUTURE) IMPLANT
SUT PROLENE 3 0 SH DA (SUTURE) ×4 IMPLANT
SUT PROLENE 3 0 SH1 36 (SUTURE) IMPLANT
SUT PROLENE 4 0 RB 1 (SUTURE) ×2
SUT PROLENE 4 0 SH DA (SUTURE) ×4 IMPLANT
SUT PROLENE 4-0 RB1 .5 CRCL 36 (SUTURE) ×2 IMPLANT
SUT PROLENE 5 0 C 1 36 (SUTURE) ×4 IMPLANT
SUT PROLENE 6 0 C 1 30 (SUTURE) ×8 IMPLANT
SUT PROLENE 6 0 CC (SUTURE) ×20 IMPLANT
SUT PROLENE 8 0 BV175 6 (SUTURE) IMPLANT
SUT PROLENE BLUE 7 0 (SUTURE) ×8 IMPLANT
SUT PROLENE POLY MONO (SUTURE) ×8 IMPLANT
SUT SILK  1 MH (SUTURE)
SUT SILK 1 MH (SUTURE) IMPLANT
SUT SILK 2 0 SH CR/8 (SUTURE) ×4 IMPLANT
SUT SILK 3 0 SH CR/8 (SUTURE) IMPLANT
SUT STEEL 6MS V (SUTURE) ×8 IMPLANT
SUT STEEL SZ 6 DBL 3X14 BALL (SUTURE) ×8 IMPLANT
SUT VIC AB 1 CTX 36 (SUTURE) ×6
SUT VIC AB 1 CTX36XBRD ANBCTR (SUTURE) ×6 IMPLANT
SUT VIC AB 2-0 CT1 27 (SUTURE) ×2
SUT VIC AB 2-0 CT1 TAPERPNT 27 (SUTURE) ×2 IMPLANT
SUT VIC AB 2-0 CTX 27 (SUTURE) IMPLANT
SUT VIC AB 3-0 X1 27 (SUTURE) ×4 IMPLANT
SUTURE E-PAK OPEN HEART (SUTURE) ×4 IMPLANT
SYSTEM SAHARA CHEST DRAIN ATS (WOUND CARE) ×4 IMPLANT
TAPE CLOTH SURG 4X10 WHT LF (GAUZE/BANDAGES/DRESSINGS) ×8 IMPLANT
TAPE PAPER 2X10 WHT MICROPORE (GAUZE/BANDAGES/DRESSINGS) ×4 IMPLANT
TOWEL GREEN STERILE (TOWEL DISPOSABLE) ×4 IMPLANT
TOWEL GREEN STERILE FF (TOWEL DISPOSABLE) IMPLANT
TOWEL OR 17X24 6PK STRL BLUE (TOWEL DISPOSABLE) IMPLANT
TOWEL OR 17X26 10 PK STRL BLUE (TOWEL DISPOSABLE) IMPLANT
TRAY CATH LUMEN 1 20CM STRL (SET/KITS/TRAYS/PACK) ×4 IMPLANT
TRAY FOLEY SILVER 16FR TEMP (SET/KITS/TRAYS/PACK) ×4 IMPLANT
TUBING ART PRESS 48 MALE/FEM (TUBING) ×8 IMPLANT
TUBING INSUFFLATION (TUBING) ×4 IMPLANT
UNDERPAD 30X30 (UNDERPADS AND DIAPERS) ×4 IMPLANT
VEIN SAPHENOUS CRYO  61-70CM (Tissue) ×2 IMPLANT
VEIN SAPHENOUS CRYO 61-70CM (Tissue) ×2 IMPLANT
WATER STERILE IRR 1000ML POUR (IV SOLUTION) ×8 IMPLANT

## 2017-03-22 NOTE — Brief Op Note (Signed)
03/11/2017 - 03/22/2017  11:57 AM  PATIENT:  Douglas Cannon  61 y.o. male  PRE-OPERATIVE DIAGNOSIS:  CAD  POST-OPERATIVE DIAGNOSIS:  CAD  PROCEDURE:  Procedure(s): CORONARY ARTERY BYPASS GRAFTING (CABG) (N/A) TRANSESOPHAGEAL ECHOCARDIOGRAM (TEE) (N/A) LIMA-LAD SVG-DIAD CRYOVEIN -OM2 CRYOVEIN -OM1 EVH LEFT THIGH, RIGHT GSV EXPLORED -TOO SMALL TO USE  SURGEON:  Surgeon(s) and Role:    Kerin Perna* Van Trigt, Peter, MD - Primary  PHYSICIAN ASSISTANT: Marzella Miracle PA-C  ANESTHESIA:   general  EBL:  Total I/O In: 2100 [I.V.:2100] Out: 675 [Urine:675]  BLOOD ADMINISTERED:2 UNITS CC PRBC and 2 PACK FFP  DRAINS: 2 UNITS CC PRBC and 2 FFP   LOCAL MEDICATIONS USED:  NONE  SPECIMEN:  No Specimen  DISPOSITION OF SPECIMEN:  PATHOLOGY  COUNTS:  YES  TOURNIQUET:  * No tourniquets in log *  DICTATION: .Other Dictation: Dictation Number PENDING  PLAN OF CARE: Admit to inpatient   PATIENT DISPOSITION:  ICU - intubated and hemodynamically stable.   Delay start of Pharmacological VTE agent (>24hrs) due to surgical blood loss or risk of bleeding: yes  COMPLICATIONS: NO KNOWN

## 2017-03-22 NOTE — Progress Notes (Signed)
  Echocardiogram Echocardiogram Transesophageal has been performed.  Douglas Cannon, Douglas Cannon 03/22/2017, 9:33 AM

## 2017-03-22 NOTE — Transfer of Care (Signed)
Immediate Anesthesia Transfer of Care Note  Patient: Douglas Cannon  Procedure(s) Performed: Procedure(s): CORONARY ARTERY BYPASS GRAFTING (CABG)x4 using left internal mammary artery, cryo shapenous vein and left greater saphenous vein harvested endoscopically.  LIMA-LAD SVG-DIAD CRYOVEIN -OM2 CRYOVEIN -OM1 EVH LEFT THIGH, RIGHT GSV EXPLORED -TOO SMALL TO USE (N/A) TRANSESOPHAGEAL ECHOCARDIOGRAM (TEE) (N/A)  Patient Location: SICU  Anesthesia Type:General  Level of Consciousness: Patient remains intubated per anesthesia plan  Airway & Oxygen Therapy: Patient remains intubated per anesthesia plan and Patient placed on Ventilator (see vital sign flow sheet for setting)  Post-op Assessment: Report given to RN  Post vital signs: Reviewed and stable  Last Vitals:  Vitals:   03/22/17 0051 03/22/17 0450  BP: (!) 113/56 (!) 146/81  Pulse: 63 72  Resp: 19   Temp: 36.7 C 36.7 C    Last Pain:  Vitals:   03/22/17 0450  TempSrc: Oral  PainSc:       Patients Stated Pain Goal: 0 (03/21/17 0850)  Complications: No apparent anesthesia complications

## 2017-03-22 NOTE — Progress Notes (Signed)
CT Surgery PM RRounds  nsr Intubated due to tongue swelling CI 2.0 Lungs clear Plan extubate later

## 2017-03-22 NOTE — Anesthesia Postprocedure Evaluation (Addendum)
Anesthesia Post Note  Patient: Douglas BondDavid Cannon  Procedure(s) Performed: Procedure(s) (LRB): CORONARY ARTERY BYPASS GRAFTING (CABG)x4 using left internal mammary artery, cryo shapenous vein and left greater saphenous vein harvested endoscopically.  LIMA-LAD SVG-DIAD CRYOVEIN -OM2 CRYOVEIN -OM1 EVH LEFT THIGH, RIGHT GSV EXPLORED -TOO SMALL TO USE (N/A) TRANSESOPHAGEAL ECHOCARDIOGRAM (TEE) (N/A)  Patient location during evaluation: SICU Anesthesia Type: General Level of consciousness: patient remains intubated per anesthesia plan Pain management: pain level controlled Vital Signs Assessment: post-procedure vital signs reviewed and stable Respiratory status: patient on ventilator - see flowsheet for VS and patient remains intubated per anesthesia plan Anesthetic complications: no       Last Vitals:  Vitals:   03/22/17 1945 03/22/17 2000  BP:  101/71  Pulse: 71 70  Resp: 12 12  Temp: 36.6 C 36.6 C    Last Pain:  Vitals:   03/22/17 1930  TempSrc: Core (Comment)  PainSc:                  Douglas Cannon,Dwaine COKER

## 2017-03-22 NOTE — Anesthesia Procedure Notes (Signed)
Procedure Name: Intubation Date/Time: 03/22/2017 7:57 AM Performed by: Neldon Newport Pre-anesthesia Checklist: Timeout performed, Patient being monitored, Suction available, Emergency Drugs available and Patient identified Patient Re-evaluated:Patient Re-evaluated prior to inductionOxygen Delivery Method: Ambu bag, Nasal cannula, Simple face mask and Circle system utilized Preoxygenation: Pre-oxygenation with 100% oxygen Intubation Type: IV induction Ventilation: Mask ventilation without difficulty Laryngoscope Size: Mac and 3 Grade View: Grade II Tube type: Oral Tube size: 8.0 mm Number of attempts: 1 Placement Confirmation: breath sounds checked- equal and bilateral,  positive ETCO2 and ETT inserted through vocal cords under direct vision Secured at: 23 cm Tube secured with: Tape Dental Injury: Teeth and Oropharynx as per pre-operative assessment

## 2017-03-22 NOTE — Anesthesia Procedure Notes (Signed)
Central Venous Catheter Insertion Performed by: Arta BruceSSEY, Douglas Cannon, anesthesiologist Start/End5/06/2017 6:45 AM, 03/22/2017 6:55 AM Patient location: Pre-op. Preanesthetic checklist: patient identified, IV checked, risks and benefits discussed, surgical consent, monitors and equipment checked, pre-op evaluation, timeout performed and anesthesia consent Lidocaine 1% used for infiltration and patient sedated Hand hygiene performed  and maximum sterile barriers used  Catheter size: 8.5 Fr Sheath introducer Procedure performed using ultrasound guided technique. Ultrasound Notes:anatomy identified, needle tip was noted to be adjacent to the nerve/plexus identified, no ultrasound evidence of intravascular and/or intraneural injection and image(s) printed for medical record Attempts: 1 Following insertion, line sutured and dressing applied. Post procedure assessment: blood return through all ports, free fluid flow and no air  Patient tolerated the procedure well with no immediate complications.

## 2017-03-22 NOTE — Anesthesia Preprocedure Evaluation (Signed)
Anesthesia Evaluation  Patient identified by MRN, date of birth, ID band Patient awake    Reviewed: Allergy & Precautions, NPO status , Patient's Chart, lab work & pertinent test results  Airway Mallampati: II  TM Distance: >3 FB Neck ROM: Full    Dental  (+) Teeth Intact   Pulmonary    breath sounds clear to auscultation       Cardiovascular hypertension,  Rhythm:Regular Rate:Normal     Neuro/Psych    GI/Hepatic   Endo/Other  diabetes  Renal/GU      Musculoskeletal   Abdominal   Peds  Hematology   Anesthesia Other Findings   Reproductive/Obstetrics                             Anesthesia Physical Anesthesia Plan  ASA: III  Anesthesia Plan: General   Post-op Pain Management:    Induction: Intravenous  Airway Management Planned: Oral ETT  Additional Equipment: CVP, PA Cath, 3D TEE, Ultrasound Guidance Line Placement and Arterial line  Intra-op Plan:   Post-operative Plan: Post-operative intubation/ventilation  Informed Consent: I have reviewed the patients History and Physical, chart, labs and discussed the procedure including the risks, benefits and alternatives for the proposed anesthesia with the patient or authorized representative who has indicated his/her understanding and acceptance.     Plan Discussed with: CRNA and Anesthesiologist  Anesthesia Plan Comments:         Anesthesia Quick Evaluation

## 2017-03-22 NOTE — Progress Notes (Signed)
The patient was examined and preop studies reviewed. There has been no change from the prior exam and the patient is ready for surgery. Plan CABG on D [place]

## 2017-03-23 ENCOUNTER — Encounter (HOSPITAL_COMMUNITY): Payer: Self-pay

## 2017-03-23 ENCOUNTER — Encounter (HOSPITAL_COMMUNITY): Payer: Self-pay | Admitting: Cardiothoracic Surgery

## 2017-03-23 ENCOUNTER — Inpatient Hospital Stay (HOSPITAL_COMMUNITY): Payer: BLUE CROSS/BLUE SHIELD

## 2017-03-23 ENCOUNTER — Ambulatory Visit (HOSPITAL_COMMUNITY)
Admission: RE | Admit: 2017-03-23 | Discharge: 2017-03-23 | Disposition: A | Discharge status: Home / Self Care | Payer: BLUE CROSS/BLUE SHIELD | Source: Ambulatory Visit | Attending: Nephrology | Admitting: Nephrology

## 2017-03-23 LAB — TYPE AND SCREEN
ABO/RH(D): B POS
Antibody Screen: NEGATIVE
Unit division: 0
Unit division: 0
Unit division: 0
Unit division: 0

## 2017-03-23 LAB — GLUCOSE, CAPILLARY
Glucose-Capillary: 107 mg/dL — ABNORMAL HIGH (ref 65–99)
Glucose-Capillary: 114 mg/dL — ABNORMAL HIGH (ref 65–99)
Glucose-Capillary: 126 mg/dL — ABNORMAL HIGH (ref 65–99)
Glucose-Capillary: 129 mg/dL — ABNORMAL HIGH (ref 65–99)
Glucose-Capillary: 129 mg/dL — ABNORMAL HIGH (ref 65–99)
Glucose-Capillary: 134 mg/dL — ABNORMAL HIGH (ref 65–99)
Glucose-Capillary: 137 mg/dL — ABNORMAL HIGH (ref 65–99)
Glucose-Capillary: 144 mg/dL — ABNORMAL HIGH (ref 65–99)
Glucose-Capillary: 145 mg/dL — ABNORMAL HIGH (ref 65–99)
Glucose-Capillary: 148 mg/dL — ABNORMAL HIGH (ref 65–99)
Glucose-Capillary: 151 mg/dL — ABNORMAL HIGH (ref 65–99)
Glucose-Capillary: 151 mg/dL — ABNORMAL HIGH (ref 65–99)
Glucose-Capillary: 151 mg/dL — ABNORMAL HIGH (ref 65–99)
Glucose-Capillary: 177 mg/dL — ABNORMAL HIGH (ref 65–99)
Glucose-Capillary: 183 mg/dL — ABNORMAL HIGH (ref 65–99)
Glucose-Capillary: 214 mg/dL — ABNORMAL HIGH (ref 65–99)
Glucose-Capillary: 95 mg/dL (ref 65–99)
Glucose-Capillary: 96 mg/dL (ref 65–99)

## 2017-03-23 LAB — CBC
HCT: 29.3 % — ABNORMAL LOW (ref 39.0–52.0)
HCT: 29.4 % — ABNORMAL LOW (ref 39.0–52.0)
Hemoglobin: 9.8 g/dL — ABNORMAL LOW (ref 13.0–17.0)
Hemoglobin: 9.7 g/dL — ABNORMAL LOW (ref 13.0–17.0)
MCH: 29.2 pg (ref 26.0–34.0)
MCH: 29 pg (ref 26.0–34.0)
MCHC: 33.4 g/dL (ref 30.0–36.0)
MCHC: 33 g/dL (ref 30.0–36.0)
MCV: 87.2 fL (ref 78.0–100.0)
MCV: 87.8 fL (ref 78.0–100.0)
Platelets: 195 10*3/uL (ref 150–400)
Platelets: 195 10*3/uL (ref 150–400)
RBC: 3.36 MIL/uL — ABNORMAL LOW (ref 4.22–5.81)
RBC: 3.35 MIL/uL — ABNORMAL LOW (ref 4.22–5.81)
RDW: 14.7 % (ref 11.5–15.5)
RDW: 14.9 % (ref 11.5–15.5)
WBC: 18.6 10*3/uL — ABNORMAL HIGH (ref 4.0–10.5)
WBC: 25.2 10*3/uL — ABNORMAL HIGH (ref 4.0–10.5)

## 2017-03-23 LAB — POCT I-STAT 3, ART BLOOD GAS (G3+)
Acid-base deficit: 1 mmol/L (ref 0.0–2.0)
Acid-base deficit: 2 mmol/L (ref 0.0–2.0)
Acid-base deficit: 1 mmol/L (ref 0.0–2.0)
Bicarbonate: 22.7 mmol/L (ref 20.0–28.0)
Bicarbonate: 23.4 mmol/L (ref 20.0–28.0)
Bicarbonate: 22.7 mmol/L (ref 20.0–28.0)
O2 Saturation: 95 %
O2 Saturation: 95 %
O2 Saturation: 93 %
Patient temperature: 37.4
Patient temperature: 37.5
TCO2: 24 mmol/L (ref 0–100)
TCO2: 25 mmol/L (ref 0–100)
TCO2: 24 mmol/L (ref 0–100)
pCO2 arterial: 38.5 mmHg (ref 32.0–48.0)
pCO2 arterial: 39.1 mmHg (ref 32.0–48.0)
pCO2 arterial: 34.8 mmHg (ref 32.0–48.0)
pH, Arterial: 7.381 (ref 7.350–7.450)
pH, Arterial: 7.388 (ref 7.350–7.450)
pH, Arterial: 7.423 (ref 7.350–7.450)
pO2, Arterial: 77 mmHg — ABNORMAL LOW (ref 83.0–108.0)
pO2, Arterial: 78 mmHg — ABNORMAL LOW (ref 83.0–108.0)
pO2, Arterial: 64 mmHg — ABNORMAL LOW (ref 83.0–108.0)

## 2017-03-23 LAB — CREATININE, SERUM
Creatinine, Ser: 2.12 mg/dL — ABNORMAL HIGH (ref 0.61–1.24)
GFR calc Af Amer: 38 mL/min — ABNORMAL LOW (ref >60)
GFR calc non Af Amer: 32 mL/min — ABNORMAL LOW (ref >60)

## 2017-03-23 LAB — PREPARE FRESH FROZEN PLASMA
Unit division: 0
Unit division: 0

## 2017-03-23 LAB — BPAM RBC
Blood Product Expiration Date: 201805252359
Blood Product Expiration Date: 201805252359
Blood Product Expiration Date: 201805302359
Blood Product Expiration Date: 201805302359
ISSUE DATE / TIME: 201805080837
ISSUE DATE / TIME: 201805080837
ISSUE DATE / TIME: 201805081011
ISSUE DATE / TIME: 201805081011
Unit Type and Rh: 7300
Unit Type and Rh: 7300
Unit Type and Rh: 7300
Unit Type and Rh: 7300

## 2017-03-23 LAB — BPAM FFP
Blood Product Expiration Date: 201805132359
Blood Product Expiration Date: 201805132359
ISSUE DATE / TIME: 201805081203
ISSUE DATE / TIME: 201805081203
Unit Type and Rh: 1700
Unit Type and Rh: 7300

## 2017-03-23 LAB — BASIC METABOLIC PANEL
Anion gap: 10 (ref 5–15)
BUN: 27 mg/dL — ABNORMAL HIGH (ref 6–20)
CO2: 20 mmol/L — ABNORMAL LOW (ref 22–32)
Calcium: 8 mg/dL — ABNORMAL LOW (ref 8.9–10.3)
Chloride: 106 mmol/L (ref 101–111)
Creatinine, Ser: 1.8 mg/dL — ABNORMAL HIGH (ref 0.61–1.24)
GFR calc Af Amer: 46 mL/min — ABNORMAL LOW (ref >60)
GFR calc non Af Amer: 40 mL/min — ABNORMAL LOW (ref >60)
Glucose, Bld: 147 mg/dL — ABNORMAL HIGH (ref 65–99)
Potassium: 5 mmol/L (ref 3.5–5.1)
Sodium: 136 mmol/L (ref 135–145)

## 2017-03-23 LAB — POCT I-STAT, CHEM 8
BUN: 30 mg/dL — ABNORMAL HIGH (ref 6–20)
Calcium, Ion: 1.16 mmol/L (ref 1.15–1.40)
Chloride: 102 mmol/L (ref 101–111)
Creatinine, Ser: 2.1 mg/dL — ABNORMAL HIGH (ref 0.61–1.24)
Glucose, Bld: 245 mg/dL — ABNORMAL HIGH (ref 65–99)
HCT: 27 % — ABNORMAL LOW (ref 39.0–52.0)
Hemoglobin: 9.2 g/dL — ABNORMAL LOW (ref 13.0–17.0)
Potassium: 4.7 mmol/L (ref 3.5–5.1)
Sodium: 132 mmol/L — ABNORMAL LOW (ref 135–145)
TCO2: 23 mmol/L (ref 0–100)

## 2017-03-23 LAB — MAGNESIUM
Magnesium: 2.6 mg/dL — ABNORMAL HIGH (ref 1.7–2.4)
Magnesium: 2.5 mg/dL — ABNORMAL HIGH (ref 1.7–2.4)

## 2017-03-23 LAB — COOXEMETRY PANEL
Carboxyhemoglobin: 1.5 % (ref 0.5–1.5)
Methemoglobin: 1.3 % (ref 0.0–1.5)
O2 Saturation: 71.6 %
Total hemoglobin: 9.7 g/dL — ABNORMAL LOW (ref 12.0–16.0)

## 2017-03-23 MED ORDER — INSULIN DETEMIR 100 UNIT/ML ~~LOC~~ SOLN
12.0000 [IU] | Freq: Two times a day (BID) | SUBCUTANEOUS | Status: DC
  Filled 2017-03-23: qty 0.12

## 2017-03-23 MED ORDER — FUROSEMIDE 10 MG/ML IJ SOLN
20.0000 mg | Freq: Two times a day (BID) | INTRAMUSCULAR | Status: DC
  Administered 2017-03-23: 20 mg via INTRAVENOUS
  Filled 2017-03-23: qty 2

## 2017-03-23 MED ORDER — CARVEDILOL 6.25 MG PO TABS
6.2500 mg | ORAL_TABLET | Freq: Two times a day (BID) | ORAL | Status: DC
  Administered 2017-03-23 – 2017-03-27 (×9): 6.25 mg via ORAL
  Filled 2017-03-23 (×9): qty 1

## 2017-03-23 MED ORDER — FUROSEMIDE 10 MG/ML IJ SOLN
40.0000 mg | Freq: Once | INTRAMUSCULAR | Status: AC
Start: 2017-03-23 — End: 2017-03-23
  Administered 2017-03-23: 40 mg via INTRAVENOUS
  Filled 2017-03-23: qty 4

## 2017-03-23 MED ORDER — FUROSEMIDE 10 MG/ML IJ SOLN
20.0000 mg | Freq: Once | INTRAMUSCULAR | Status: AC
  Administered 2017-03-23: 20 mg via INTRAVENOUS
  Filled 2017-03-23: qty 2

## 2017-03-23 MED ORDER — INSULIN ASPART 100 UNIT/ML ~~LOC~~ SOLN
0.0000 [IU] | SUBCUTANEOUS | Status: DC
  Administered 2017-03-23: 4 [IU] via SUBCUTANEOUS
  Administered 2017-03-23: 8 [IU] via SUBCUTANEOUS
  Administered 2017-03-24: 2 [IU] via SUBCUTANEOUS
  Administered 2017-03-24: 4 [IU] via SUBCUTANEOUS

## 2017-03-23 MED ORDER — INSULIN DETEMIR 100 UNIT/ML ~~LOC~~ SOLN
12.0000 [IU] | Freq: Two times a day (BID) | SUBCUTANEOUS | Status: DC
  Administered 2017-03-23: 12 [IU] via SUBCUTANEOUS
  Filled 2017-03-23 (×2): qty 0.12

## 2017-03-23 MED ORDER — CLOPIDOGREL BISULFATE 75 MG PO TABS
75.0000 mg | ORAL_TABLET | Freq: Every day | ORAL | Status: DC
  Administered 2017-03-24 – 2017-03-27 (×4): 75 mg via ORAL
  Filled 2017-03-23 (×4): qty 1

## 2017-03-23 MED ORDER — INSULIN DETEMIR 100 UNIT/ML ~~LOC~~ SOLN
20.0000 [IU] | Freq: Two times a day (BID) | SUBCUTANEOUS | Status: DC
  Administered 2017-03-23 – 2017-03-24 (×3): 20 [IU] via SUBCUTANEOUS
  Filled 2017-03-23 (×4): qty 0.2

## 2017-03-23 MED ORDER — CITALOPRAM HYDROBROMIDE 20 MG PO TABS
20.0000 mg | ORAL_TABLET | Freq: Every day | ORAL | Status: DC
  Administered 2017-03-23 – 2017-03-27 (×5): 20 mg via ORAL
  Filled 2017-03-23 (×5): qty 1

## 2017-03-23 MED FILL — Potassium Chloride Inj 2 mEq/ML: INTRAVENOUS | Qty: 40 | Status: AC

## 2017-03-23 MED FILL — Heparin Sodium (Porcine) Inj 1000 Unit/ML: INTRAMUSCULAR | Qty: 30 | Status: AC

## 2017-03-23 MED FILL — Magnesium Sulfate Inj 50%: INTRAMUSCULAR | Qty: 10 | Status: AC

## 2017-03-23 NOTE — Care Management Note (Signed)
Case Management Note Original Note Created  Gala LewandowskyGraves-Bigelow, Brenda Kaye, RN  Patient Details  Name: Douglas BondDavid Eliot MRN: [id number] Date of Birth: 01/01/1956  Subjective/Objective: Pt [facility] for Acute Respiratory Failure and Nstemi. Post cardiac cath 03-15-17 that revealed multivessel CAD. Plan for Surgery to consult. Pt continues on IV Heparin gtt, IV Lasix and IV rocephin.  Pt is from home with his wife.                    Action/Plan: CM will continue to monitor for additional needs.   Expected Discharge Date:                  Expected Discharge Plan:  Home w Home Health Services  In-House Referral:     Discharge planning Services  CM Consult  Post Acute Care Choice:    Choice offered to:     DME Arranged:    DME Agency:     HH Arranged:    HH Agency:     Status of Service:  In process, will continue to follow  If discussed at Long Length of Stay Meetings, dates discussed:  03-22-17  Discharge Disposition:   Additional Comments:  03/23/17- 1025- Donn PieriniKristi Deshay Kirstein RN, CM- pt s/p CABGx4 on 03/22/17- currently on 2H post  Op. Doing well extubated.  CM to continue to follow for d/c needs  1601 03-21-17 Tomi BambergerBrenda Graves-Bigelow, RN,BSN [phone] Plan for CABG 03-22-17. CM will continue to monitor for disposition needs.   Zenda AlpersWebster, [place] Royal[place]risti Hall, RN 03/23/2017, 10:25 AM [phone]

## 2017-03-23 NOTE — Progress Notes (Signed)
      [address]       [place] [id number]             [phone]      Up in chair, comfortable  BP 95/62   Pulse 72   Temp 98.1 F (36.7 C) (Oral)   Resp 12   Ht 5' 10.08" (1.78 m)   Wt 188 lb 7.9 oz (85.5 kg)   SpO2 96%   BMI 26.99 kg/m    Intake/Output Summary (Last 24 hours) at 03/23/17 1656 Last data filed at 03/23/17 1600  Gross per 24 hour  Intake          1609.47 ml  Output             1700 ml  Net           -90.53 ml   CBG trending better PM labs pending  Viviann SpareSteven C. Dorris FetchHendrickson, MD [facility] [phone]

## 2017-03-23 NOTE — Progress Notes (Signed)
Anesthesiology Follow-up:  Awake and alert, neuro intact, in good spirits, minimal pain, ambulating with assistance.  VS: T- 36.7 BP- 115/72 HR-73 (SR) RR-15 O2 Sat 96% on RA  WBC 18,600 K-5.0 glucose- 134 BUN/Cr 27/1.8 H/H- 9.8/29.3 Platelets-  195,000  61 year  Old male 1 day S/P CABG X 4. Extubated 8 hours post-op. Still on low dose milrinone, WBC elevated otherwise  Looks good.

## 2017-03-23 NOTE — Progress Notes (Signed)
Patient extubated at 0028 to 4L nasal cannula. Patient performed VC 1.5L and NIF -25 with good patient effort. Patient had positive cuff leak prior to extubation. Patient was able to vocalize after extubation. No problems noted at this time.

## 2017-03-23 NOTE — Progress Notes (Signed)
1 Day Post-Op Procedure(s) (LRB): CORONARY ARTERY BYPASS GRAFTING (CABG)x4 using left internal mammary artery, cryo shapenous vein and left greater saphenous vein harvested endoscopically.  LIMA-LAD SVG-DIAD CRYOVEIN -OM2 CRYOVEIN -OM1 EVH LEFT THIGH, RIGHT GSV EXPLORED -TOO SMALL TO USE (N/A) TRANSESOPHAGEAL ECHOCARDIOGRAM (TEE) (N/A) Subjective: Doing well extubated nsr CI 1.9 cxr clear  Objective: Vital signs in last 24 hours: Temp:  [96.1 F (35.6 C)-99.5 F (37.5 C)] 98.8 F (37.1 C) (05/09 0800) Pulse Rate:  [65-89] 77 (05/09 0800) Cardiac Rhythm: Normal sinus rhythm (05/09 0800) Resp:  [11-23] 15 (05/09 0800) BP: (79-135)/(61-92) 126/81 (05/09 0800) SpO2:  [92 %-100 %] 100 % (05/09 0800) Arterial Line BP: (83-188)/(46-87) 150/53 (05/09 0700) FiO2 (%):  [40 %-60 %] 40 % (05/09 0000) Weight:  [174 lb 2.6 oz (79 kg)-188 lb 7.9 oz (85.5 kg)] 188 lb 7.9 oz (85.5 kg) (05/09 0500)  Hemodynamic parameters for last 24 hours: PAP: (16-46)/(5-19) 20/9 CO:  [3.5 L/min-4.2 L/min] 3.7 L/min CI:  [1.8 L/min/m2-2.2 L/min/m2] 1.9 L/min/m2  Intake/Output from previous day: 05/08 0701 - 05/09 0700 In: 7798.6 [I.V.:5296.6; QMVHQ:4696Blood:1477; IV Piggyback:1025] Out: 3950 [Urine:1855; Blood:1625; Chest Tube:470] Intake/Output this shift: Total I/O In: 60.1 [I.V.:60.1] Out: 105 [Urine:75; Chest Tube:30]       Exam    General- alert and comfortable   Lungs- clear without rales, wheezes   Cor- regular rate and rhythm, no murmur , gallop   Abdomen- soft, non-tender   Extremities - warm, non-tender, minimal edema   Neuro- oriented, appropriate, no focal weakness   Lab Results:  Recent Labs  03/22/17 2005 03/22/17 2007 03/23/17 0400  WBC 18.2*  --  18.6*  HGB 10.2* 9.2* 9.8*  HCT 30.4* 27.0* 29.3*  PLT 180  --  195   BMET:  Recent Labs  03/22/17 0517  03/22/17 2007 03/23/17 0400  NA 137  < > 137 136  K 4.4  < > 4.4 5.0  CL 102  < > 104 106  CO2 25  --   --  20*  GLUCOSE  157*  < > 148* 147*  BUN 33*  < > 27* 27*  CREATININE 1.85*  < > 1.60* 1.80*  CALCIUM 8.3*  --   --  8.0*  < > = values in this interval not displayed.  PT/INR:  Recent Labs  03/22/17 1430  LABPROT 16.1*  INR 1.28   ABG    Component Value Date/Time   PHART 7.423 03/23/2017 0735   HCO3 22.7 03/23/2017 0735   TCO2 24 03/23/2017 0735   ACIDBASEDEF 1.0 03/23/2017 0735   O2SAT 93.0 03/23/2017 0735   CBG (last 3)   Recent Labs  03/23/17 0559 03/23/17 0656 03/23/17 0759  GLUCAP 151* 145* 137*    Assessment/Plan: S/P Procedure(s) (LRB): CORONARY ARTERY BYPASS GRAFTING (CABG)x4 using left internal mammary artery, cryo shapenous vein and left greater saphenous vein harvested endoscopically.  LIMA-LAD SVG-DIAD CRYOVEIN -OM2 CRYOVEIN -OM1 EVH LEFT THIGH, RIGHT GSV EXPLORED -TOO SMALL TO USE (N/A) TRANSESOPHAGEAL ECHOCARDIOGRAM (TEE) (N/A) Mobilize Diuresis Diabetes control See progression orders follow elevated WBC   LOS: 12 days    Kathlee Nationseter Van Trigt III 03/23/2017

## 2017-03-23 NOTE — Op Note (Signed)
[name]:  Douglas Cannon, Douglas Cannon               ACCOUNT NO.:  [id number]  MEDICAL RECORD NO.:  [id number]  LOCATION:  [facility]                         FACILITY:  [facility]  PHYSICIAN:  Kerin PernaPeter Van Trigt, M.D.  DATE OF BIRTH:  [birth year]  DATE OF PROCEDURE:  03/22/2017 DATE OF DISCHARGE:                              OPERATIVE REPORT   OPERATION: 1. Coronary artery bypass grafting x4 (left internal mammary artery to     LAD, native reverse saphenous vein graft to diagonal, cryopreserved     saphenous vein allograft to OM 1, cryopreserved saphenous vein     allograft to OM 2). 2. Endoscopic harvest of left leg saphenous vein with bilateral     exploration of saphenous vein revealing no other adequate conduit,     thus requiring cryopreserved vein. 3. Placement of left femoral A-line for blood pressure monitoring.  SURGEON:  Kerin PernaPeter Van Trigt, MD.  ASSISTANT:  Rowe ClackWayne E. Gold, PA-C.  PREOPERATIVE DIAGNOSES: 1. Ischemic cardiomyopathy, ejection fraction 20% with class IV heart     failure and severe edema. 2. Diabetic pattern, severe 3-vessel coronary artery disease. 3. Diabetes mellitus.  POSTOPERATIVE DIAGNOSES: 1. Ischemic cardiomyopathy, ejection fraction 20% with class IV heart     failure and severe edema. 2. Diabetic pattern, severe 3-vessel coronary artery disease. 3. Diabetes mellitus.  CLINICAL NOTE:  The patient is a 61 year old diabetic, nonsmoker, who presented with acute respiratory failure with pulmonary edema, almost requiring intubation in the emergency department.  His cardiac enzymes were positive.  He was managed with BiPAP and diuretics.  Echocardiogram showed severe LV dysfunction with mild mitral regurgitation.  As he was stabilized and his creatinine improved, he underwent catheterization, which showed three-vessel coronary artery disease with severe stenosis of the LAD, right coronary and circumflex distributions.  After optimizing his medical condition, a repeat cardiac  catheterization showed improved filling pressures and improved cardiac output.  Cardiac MRI showed evidence of viability and he was recommended for multivessel bypass grafting at high risk due to his poor LV function.  Prior to surgery, I examined the patient on several occasions and discussed the role of CABG for treatment of his ischemic cardiomyopathy, 3-vessel CAD, and poor LV function.  He understood the expected benefits to be improved LV function, improve survival and improved symptoms of heart failure and prevention of further hospitalizations for heart failure.  He understood the risks to include stroke, bleeding, blood transfusion requirement, infection, death, multisystem failure, postoperative pulmonary problems including pleural effusions, infection, and death.  After reviewing these issues, he demonstrated his understanding and agreed to proceed with surgery under what I felt was an informed consent.  FINDINGS: 1. Poor native saphenous vein in both legs.  One segment was used for     the diagonal branch to LAD.  Cryopreserved saphenous vein allograft     was used for the longer grafts to the circumflex vessels. 2. Improved global LV function after CABG x4 and separation from     cardiopulmonary bypass by TEE. 3. Intraoperative anemia requiring transfusion to packed cells for     hemoglobin of 6.5 at the onset of surgery.  OPERATIVE PROCEDURE:  The patient was brought to the  operating room and placed supine on the operating table where general anesthesia was induced under invasive hemodynamic monitoring.  A transesophageal echo probe was placed by the Anesthesia team.  This confirmed the preoperative diagnosis of ischemic cardiomyopathy.  There was no significant mitral valve regurgitation.  The patient was prepped and draped as a sterile field.  A proper time-out was performed.  A femoral A-line was placed in the left femoral artery for blood pressure monitoring.  It  was also placed for possible requirement for balloon pump later in the day.  Saphenous vein was explored in both legs and only a small segment could be found that was adequate conduit from the left leg.  The left internal mammary artery was harvested from a pedicle graft and the vessel was taken down from its origin at the subclavian vessels.  It was a 1.5-1.7 mm vessel with excellent flow.  The sternal retractor was placed, and the pericardium was opened and suspended. Pursestrings were placed in the ascending aorta and right atrium.  The heart was examined.  It was dilated without significant transmural scarring, but overall weak.  The cryopreserved vein, the native vein, and the mammary arteries were prepared for the distal anastomoses.  The patient was heparinized and cannulated and placed on cardiopulmonary bypass.  The coronaries were identified for grafting.  The RCA had severe distal disease, which was not graftable.  The LAD diagonal, OM1, and OM2 were found to be adequate targets for grafting.  Cardioplegia cannulas were placed both antegrade and retrograde cold blood cardioplegia, and the patient was cooled to 32 degrees.  The aortic crossclamp was applied and a liter of cold blood cardioplegia was delivered in split doses between the 2 cardioplegia catheters.  There was good cardioplegic arrest and septal temperature dropped less than 15 degrees.  Cardioplegia was delivered every 20 minutes.  The distal coronary anastomoses were performed.  The first distal anastomosis was to the diagonal branch to LAD.  This was a 1.2-mm vessel.  A small saphenous vein (native) was sewn end-to-side with running 7-0 Prolene.  There was good flow through the graft.  Next, bypass grafts were placed to both the OM 1 and OM 2 vessels, both of which were 1.5-mm vessels.  A segment of saphenous vein that was cryopreserved was used for each bypass.  The anastomoses were constructed using  end-to-side running 7-0 Prolene.  There was good flow through both grafts.  The fourth distal anastomosis was to the distal third of the LAD. Proximally, it was heavily calcified.  It was 1.5-mm vessel.  The left IMA pedicle was brought through an opening and the left lateral pericardium was brought down onto the LAD and sewn end-to-side with running 8-0 Prolene.  There was good flow through the anastomosis after briefly releasing the pedicle bulldog on the mammary artery.  The bulldog was reapplied and the pedicle was secured to the epicardium. Cardioplegia was redosed.  While the crossclamp was still in place, 3 proximal vein anastomoses were performed on the ascending aorta using a 4.5-mm punch running 7-0 Prolene.  Air was vented from the coronaries in the left side of the heart using dose of retrograde warm blood cardioplegia and the usual de- airing maneuvers.  The crossclamp was removed.  The heart resumed a spontaneous rhythm.  The cardioplegia cannulas were removed.  The vein grafts were de-aired and opened, each had good flow. Hemostasis was documented at the proximal distal anastomoses.  The patient was rewarmed and reperfused.  Temporary pacing wires were applied.  The lungs were expanded.  The ventilator was resumed.  The patient was weaned off cardiopulmonary bypass on low-dose Milrinone with excellent hemodynamics and improved global LV function.  Echo showed no significant MR.  Protamine was administered without adverse reaction.  The cannulas were removed.  The mediastinum was irrigated with warm saline.  The superior pericardial fat was closed over the aorta.  Anterior mediastinal and left pleural chest tubes were placed and brought out through separate incisions.  The sternum was closed with wire.  The pectoralis fascia was closed with running #1 Vicryl.  The subcutaneous and skin layers were closed with running Vicryl.  Sterile dressings were applied.  The patient  returned to recovery room in stable condition.  Total cardiopulmonary bypass time was 130 minutes.     Kerin Perna, M.D.     PV/MEDQ  D:  03/23/2017  T:  03/23/2017  Job:  [id number]  cc:   Marca Ancona, MD

## 2017-03-24 ENCOUNTER — Inpatient Hospital Stay (HOSPITAL_COMMUNITY): Payer: BLUE CROSS/BLUE SHIELD

## 2017-03-24 LAB — BASIC METABOLIC PANEL
Anion gap: 7 (ref 5–15)
BUN: 34 mg/dL — ABNORMAL HIGH (ref 6–20)
CO2: 24 mmol/L (ref 22–32)
Calcium: 8.1 mg/dL — ABNORMAL LOW (ref 8.9–10.3)
Chloride: 103 mmol/L (ref 101–111)
Creatinine, Ser: 2.36 mg/dL — ABNORMAL HIGH (ref 0.61–1.24)
GFR calc Af Amer: 33 mL/min — ABNORMAL LOW (ref >60)
GFR calc non Af Amer: 28 mL/min — ABNORMAL LOW (ref >60)
Glucose, Bld: 159 mg/dL — ABNORMAL HIGH (ref 65–99)
Potassium: 4.3 mmol/L (ref 3.5–5.1)
Sodium: 134 mmol/L — ABNORMAL LOW (ref 135–145)

## 2017-03-24 LAB — CBC
HCT: 26.1 % — ABNORMAL LOW (ref 39.0–52.0)
Hemoglobin: 8.6 g/dL — ABNORMAL LOW (ref 13.0–17.0)
MCH: 29.2 pg (ref 26.0–34.0)
MCHC: 33 g/dL (ref 30.0–36.0)
MCV: 88.5 fL (ref 78.0–100.0)
Platelets: 171 10*3/uL (ref 150–400)
RBC: 2.95 MIL/uL — ABNORMAL LOW (ref 4.22–5.81)
RDW: 14.8 % (ref 11.5–15.5)
WBC: 20.4 10*3/uL — ABNORMAL HIGH (ref 4.0–10.5)

## 2017-03-24 LAB — GLUCOSE, CAPILLARY
Glucose-Capillary: 112 mg/dL — ABNORMAL HIGH (ref 65–99)
Glucose-Capillary: 154 mg/dL — ABNORMAL HIGH (ref 65–99)
Glucose-Capillary: 191 mg/dL — ABNORMAL HIGH (ref 65–99)
Glucose-Capillary: 202 mg/dL — ABNORMAL HIGH (ref 65–99)
Glucose-Capillary: 76 mg/dL (ref 65–99)

## 2017-03-24 MED ORDER — MENTHOL 3 MG MT LOZG
1.0000 | LOZENGE | OROMUCOSAL | Status: DC | PRN
  Filled 2017-03-24: qty 9

## 2017-03-24 MED ORDER — ASPIRIN EC 81 MG PO TBEC
81.0000 mg | DELAYED_RELEASE_TABLET | Freq: Every day | ORAL | Status: DC
  Administered 2017-03-24 – 2017-03-27 (×4): 81 mg via ORAL
  Filled 2017-03-24 (×4): qty 1

## 2017-03-24 MED ORDER — SODIUM CHLORIDE 0.9% FLUSH: 3.0000 mL | INTRAVENOUS | Status: DC | PRN

## 2017-03-24 MED ORDER — FUROSEMIDE 10 MG/ML IJ SOLN
40.0000 mg | Freq: Every day | INTRAMUSCULAR | Status: DC
  Administered 2017-03-24 – 2017-03-27 (×4): 40 mg via INTRAVENOUS
  Filled 2017-03-24 (×4): qty 4

## 2017-03-24 MED ORDER — MOVING RIGHT ALONG BOOK
Freq: Once | Status: AC
  Administered 2017-03-24: 08:00:00
  Filled 2017-03-24: qty 1

## 2017-03-24 MED ORDER — INSULIN ASPART 100 UNIT/ML ~~LOC~~ SOLN: 0.0000 [IU] | Freq: Every day | SUBCUTANEOUS | Status: DC

## 2017-03-24 MED ORDER — SODIUM CHLORIDE 0.9 % IV SOLN: 250.0000 mL | INTRAVENOUS | Status: DC | PRN

## 2017-03-24 MED ORDER — SODIUM CHLORIDE 0.9% FLUSH
3.0000 mL | Freq: Two times a day (BID) | INTRAVENOUS | Status: DC
  Administered 2017-03-24 – 2017-03-27 (×5): 3 mL via INTRAVENOUS

## 2017-03-24 MED ORDER — INSULIN ASPART 100 UNIT/ML ~~LOC~~ SOLN
6.0000 [IU] | Freq: Three times a day (TID) | SUBCUTANEOUS | Status: DC
  Administered 2017-03-24 – 2017-03-26 (×4): 6 [IU] via SUBCUTANEOUS

## 2017-03-24 MED ORDER — INSULIN ASPART 100 UNIT/ML ~~LOC~~ SOLN
0.0000 [IU] | Freq: Three times a day (TID) | SUBCUTANEOUS | Status: DC
  Administered 2017-03-24: 7 [IU] via SUBCUTANEOUS
  Administered 2017-03-25: 3 [IU] via SUBCUTANEOUS
  Administered 2017-03-25 – 2017-03-26 (×2): 4 [IU] via SUBCUTANEOUS
  Administered 2017-03-27: 3 [IU] via SUBCUTANEOUS

## 2017-03-24 MED ORDER — MAGNESIUM HYDROXIDE 400 MG/5ML PO SUSP: 30.0000 mL | Freq: Every day | ORAL | Status: DC | PRN

## 2017-03-24 NOTE — Progress Notes (Signed)
Patient arrived from [facility]H room 1 to 2W room 23.  Patient is postop day 2 from CABG x4. Telemetry monitor placed and CCMD notified.  Patient oriented to unit and room to include call light and phone.  Will continue to monitor.

## 2017-03-24 NOTE — Discharge Instructions (Signed)
Coronary Artery Bypass Grafting, Care After ° °This sheet gives you information about how to care for yourself after your procedure. Your health care provider may also give you more specific instructions. If you have problems or questions, contact your health care provider. °What can I expect after the procedure? °After the procedure, it is common to have: °· Nausea and a lack of appetite. °· Constipation. °· Weakness and fatigue. °· Depression or irritability. °· Pain or discomfort in your incision areas. °Follow these instructions at home: °Medicines  °· Take over-the-counter and prescription medicines only as told by your health care provider. Do not stop taking medicines or start any new medicines without approval from your health care provider. °· If you were prescribed an antibiotic medicine, take it as told by your health care provider. Do not stop taking the antibiotic even if you start to feel better. °· Do not drive or use heavy machinery while taking prescription pain medicine. °Incision care  °· Follow instructions from your health care provider about how to take care of your incisions. Make sure you: °¨ Wash your hands with soap and water before you change your bandage (dressing). If soap and water are not available, use hand sanitizer. °¨ Change your dressing as told by your health care provider. °¨ Leave stitches (sutures), skin glue, or adhesive strips in place. These skin closures may need to stay in place for 2 weeks or longer. If adhesive strip edges start to loosen and curl up, you may trim the loose edges. Do not remove adhesive strips completely unless your health care provider tells you to do that. °· Keep incision areas clean, dry, and protected. °· Check your incision areas every day for signs of infection. Check for: °¨ More redness, swelling, or pain. °¨ More fluid or blood. °¨ Warmth. °¨ Pus or a bad smell. °· If incisions were made in your legs: °¨ Avoid crossing your legs. °¨ Avoid  sitting for long periods of time. Change positions every 30 minutes. °¨ Raise (elevate) your legs when you are sitting. °Bathing  °· Do not take baths, swim, or use a hot tub until your health care provider approves. °· Only take sponge baths. Pat the incisions dry. Do not rub incisions with a washcloth or towel. °· Ask your health care provider when you can shower. °Eating and drinking  °· Eat foods that are high in fiber, such as raw fruits and vegetables, whole grains, beans, and nuts. Meats should be lean cut. Avoid canned, processed, and fried foods. This can help prevent constipation and is a recommended part of a heart-healthy diet. °· Drink enough fluid to keep your urine clear or pale yellow. °· Limit alcohol intake to no more than 1 drink a day for nonpregnant women and 2 drinks a day for men. One drink equals 12 oz of beer, 5 oz of wine, or 1½ oz of hard liquor. °Activity  °· Rest and limit your activity as told by your health care provider. You may be instructed to: °¨ Stop any activity right away if you have chest pain, shortness of breath, irregular heartbeats, or dizziness. Get help right away if you have any of these symptoms. °¨ Move around frequently for short periods or take short walks as directed by your health care provider. Gradually increase your activities. You may need physical therapy or cardiac rehabilitation to help strengthen your muscles and build your endurance. °¨ Avoid lifting, pushing, or pulling anything that is heavier than 10   lb (4.5 kg) for at least 6 weeks or as told by your health care provider. °· Do not drive until your health care provider approves. °· Ask your health care provider when you may return to work. °· Ask your health care provider when you may resume sexual activity. °General instructions  °· Do not use any products that contain nicotine or tobacco, such as cigarettes and e-cigarettes. If you need help quitting, ask your health care provider. °· Take 2-3 deep  breaths every few hours during the day, while you recover. This helps expand your lungs and prevent complications like pneumonia after surgery. °· If you were given a device called an incentive spirometer, use it several times a day to practice deep breathing. Support your chest with a pillow or your arms when you take deep breaths or cough. °· Wear compression stockings as told by your health care provider. These stockings help to prevent blood clots and reduce swelling in your legs. °· Weigh yourself every day. This helps identify if your body is holding (retaining) fluid that may make your heart and lungs work harder. °· Keep all follow-up visits as told by your health care provider. This is important. °Contact a health care provider if: °· You have more redness, swelling, or pain around any incision. °· You have more fluid or blood coming from any incision. °· Any incision feels warm to the touch. °· You have pus or a bad smell coming from any incision °· You have a fever. °· You have swelling in your ankles or legs. °· You have pain in your legs. °· You gain 2 lb (0.9 kg) or more a day. °· You are nauseous or you vomit. °· You have diarrhea. °Get help right away if: °· You have chest pain that spreads to your jaw or arms. °· You are short of breath. °· You have a fast or irregular heartbeat. °· You notice a "clicking" in your breastbone (sternum) when you move. °· You have numbness or weakness in your arms or legs. °· You feel dizzy or light-headed. °Summary °· After the procedure, it is common to have pain or discomfort in the incision areas. °· Do not take baths, swim, or use a hot tub until your health care provider approves. °· Gradually increase your activities. You may need physical therapy or cardiac rehabilitation to help strengthen your muscles and build your endurance. °· Weigh yourself every day. This helps identify if your body is holding (retaining) fluid that may make your heart and lungs work  harder. °This information is not intended to replace advice given to you by your health care provider. Make sure you discuss any questions you have with your health care provider. °Document Released: 05/21/2005 Document Revised: 09/20/2016 Document Reviewed: 09/20/2016 °[employer]vier Interactive Patient Education © 2017 [employer] ° °

## 2017-03-24 NOTE — Discharge Summary (Signed)
Physician Discharge Summary       [address]       [place] [address]             [phone]    Patient ID: Douglas Cannon MRN: [id number] DOB/AGE: 1955/12/15 61 y.o.  Admit date: 03/11/2017 Discharge date: 03/27/2017  Admission Diagnoses: 1. NSTEMI (non-ST elevation myocardial infarction) (HCC) of undetermined etiology 2.  Ischemic cardiomyopathy 3. Multivessel CAD 4. Acute respiratory failure with hypoxemia (HCC) 5. SIRS (systemic inflammatory response syndrome) (HCC) 6. CAP (community acquired pneumonia)  Active Diagnoses:  1. Diabetes mellitus without complication (HCC) 2. Essential hypertension 3. Acute on chronic congestive heart failure (HCC) 4. Osteomyelitis (HCC) 5. Hyperlipidemia 6. Acute renal failure superimposed on stage 3 chronic kidney disease (HCC) 7. ABL anemia     Procedure (s):  Left Heart Cath and Coronary Angiography by Dr. Tresa Endo on 03/15/2017:  Conclusion     LM lesion, 90 %stenosed.  Ost LAD to Prox LAD lesion, 95 %stenosed.  Ost 1st Diag to 1st Diag lesion, 90 %stenosed.  Ost 3rd Mrg to 3rd Mrg lesion, 95 %stenosed.  Mid Cx to Dist Cx lesion, 90 %stenosed.  RPDA lesion, 85 %stenosed.  Mid RCA lesion, 20 %stenosed.  Acute Mrg lesion, 95 %stenosed.   Findings are compatible with a significant ischemic cardiomyopathy in this patient with echo Doppler documentation of an EF of 30-35%.  Severe multivessel CAD with coronary calcification and diffuse 90-95% near ostial to mid LAD stenoses with diffuse 90% stenosis in the first diagonal vessel; 95 and 90% diffuse stenoses in segmental obtuse marginal branches of the left circumflex coronary artery with good distal targets, and diffuse 80% to 90% PDA stenosis with 20% mid RCA stenosis and 95% proximal stenosis and a small proximal branch of the RCA.  LVEDP 34 mm Hg.  RECOMMENDATION: Surgical consultation will be obtained for CABG revascularization surgery.    1.  Coronary artery bypass grafting x4 (left internal mammary artery to LAD, native reverse saphenous vein graft to diagonal, cryopreserved saphenous vein allograft to OM 1, cryopreserved saphenous vein allograft to OM 2). 2. Endoscopic harvest of left leg saphenous vein with bilateral exploration of saphenous vein revealing no other adequate conduit, thus requiring cryopreserved vein. 3. Placement of left femoral A-line for blood pressure monitoring by Dr. Donata Clay on 03/22/2017.  History of Presenting Illness: This is a 61 year old Caucasian male diabetic nonsmoker presented to the ED with acute respiratory failure, PO2 of 39 and chest x-ray showing interstitial pulmonary edema with positive cardiac enzymes. He was stabilized without intubation. He subsequently underwent echocardiogram which showed EF 25%. An echocardiogram one year ago showed EF of 55%. Patient had had less severe but similar episodes of shortness of breath prior to this admission attributed to bilateral pneumonia but probably represented less severe episodes of pulmonary edema from ischemic heart disease. Cardiac echo showed global hypokinesia and EF 25% but without significant MR or TR. Left heart Cardiac catheterization demonstrated LV EDP of 34 mmHg with severe three-vessel diabetic pattern of CAD. The distal RCA appears nongraftable. The LAD appears distally to be potentially graftable. The OM1 and OM 2 distal vessels appeared be graftable.  Patient symptomatically has improved significantly with diuresis. His creatinine is improved to 1.8. Lower extremity ultrasound shows a chronic DVT of his left leg which has chronic swelling problems. No history of pulmonary embolus.  The patient is still highly functional and works daily and is married. He has severe ischemic cardiomyopathy with suboptimal  target vessels for grafting and potential problems with conduit. However CBG is his best long-term option for therapy for preservation and  potential improvement of LV function and improved survival.  Prior to surgery patient will need right heart cath to document that his cardiac output is adequate and that his filling pressures have improved with diuretics. He remains on nasal cannula but is able to walk in the hallway and is free of orthopnea. He will need a MRI viability study to demonstrate the potential for reversibility of his ventricular dysfunction. We will also perform vein mapping of the right leg to help assure he has adequate conduit. The patient is right-hand dominant and initially could have left radial artery used as conduit if needed. 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 His left foot has a diabetic ulcer on the lateral aspect dry and neuropathic in appearance  The patient has done well managing his diabetes and his hemoglobin A1c has remained 6.0-6 0.5.  Pre op carotid duplex US showed no significant internal carotid artery stenosis bilaterally and ABI's were 1.3 on the right and 1.2 on the left. Cardiac MRI showed elayed enhancement pattern suggests that hypokinetic segments of the LV have a good chance of improving with revascularization (all segments would be expected to be viable except for the true apex which is questionable), LVEF 34%. Dr. Donata Clay discussed the need for coronary artery bypass grafting surgery. Potential risks, benefits, and complications were discussed with the patient and he agreed to proceed with surgery. He underwent a CABG x 4 on 05608/2018.  Brief Hospital Course:  The patient was extubated the evening of surgery without difficulty. He remained afebrile and hemodynamically stable. He was weaned off of Milrinone drip. Theone Murdoch, a line, chest tubes, and foley were removed early in the post operative course. He was volume over loaded and diuresed.  He had ABL anemia. He did not require a post op transfusion. Last H and H was 9.7 and 28.7. He was weaned off the insulin  drip.  Glimepiride 4 mg daily and Metformin were not immediately restarted secondary to elevated creatinine so he was continued on Insulin;however, once his creatinine has decreased more, he will be restarted on oral diabetic medications as taken pre op (at discharge). The patient's HGA1C pre op was 6.3. He was started on Plavix secondary to use of cryo vein during surgery. His creatinine did increase and went as high as 2.36. Gradually, over time, it did decrease. On discharge, his creatinine was down to 1.87.The patient was felt surgically stable for transfer from the ICU to [facility] for further convalescence on 03/24/2017. He continues to progress with cardiac rehab. He was ambulating on room air. He has been tolerating a diet and has had a bowel movement. Epicardial pacing wires were removed on 03/26/2017. Chest tube sutures will be removed the day of discharge. The patient is felt surgically stable for discharge today.   Latest Vital Signs: Blood pressure 128/65, pulse 71, temperature 97.8 F (36.6 C), temperature source Oral, resp. rate 18, height 5' 10.08" (1.78 m), weight 83.5 kg (184 lb), SpO2 97 %.  Physical Exam: Cardiovascular: RRR Pulmonary: Clear to auscultation bilaterally Abdomen: Soft, non tender, bowel sounds present. Extremities: Mild bilateral lower extremity edema L>R (chronic) Wounds: Clean and dry.  No erythema or signs of infection.  Discharge Condition:Stable and discharged to home.  Recent laboratory studies:  Lab Results  Component Value Date   WBC 19.3 (H) 03/25/2017   HGB  9.7 (L) 03/25/2017   HCT 28.7 (L) 03/25/2017   MCV 90.5 03/25/2017   PLT 227 03/25/2017   Lab Results  Component Value Date   NA 136 03/27/2017   K 4.9 03/27/2017   CL 101 03/27/2017   CO2 28 03/27/2017   CREATININE 1.87 (H) 03/27/2017   GLUCOSE 120 (H) 03/27/2017    Diagnostic Studies:  CLINICAL DATA:  Recent coronary artery bypass grafting with chest tube removal  EXAM: CHEST  2  VIEW  COMPARISON:  Mar 24, 2017  FINDINGS: Chest tube and Cordis have been removed. Temporary pacemaker wires remain attached to the right heart. No pneumothorax. There is patchy atelectasis in both mid lungs, more on the left than on the right. There is a focal area of consolidation in the posterior segment right upper lobe which does not appear appreciably changed. Heart is upper normal in size with pulmonary vascularity within normal limits. There is aortic atherosclerosis. No adenopathy. No bone lesions peer  IMPRESSION: No pneumothorax. Patchy airspace opacity, concerning for pneumonia in the posterior segment right upper lobe. Atelectatic change left mid lung. Stable cardiac prominence. There is aortic atherosclerosis.   Electronically Signed   By: Bretta Bang III M.D.   On: 03/25/2017 08:08    Ct Chest Wo Contrast  Result Date: 03/18/2017 CLINICAL DATA:  Aortic dilatation. Pt states been having some SOB and is going for CABG next week. EXAM: CT CHEST WITHOUT CONTRAST TECHNIQUE: Multidetector CT imaging of the chest was performed following the standard protocol without IV contrast. COMPARISON:  Chest radiograph 03/18/2017 FINDINGS: Cardiovascular: Coronary artery calcification and aortic atherosclerotic calcification. No pericardial fluid. Ascending thoracic aorta within normal limits (36 mm). Mediastinum/Nodes: No axillary or supraclavicular adenopathy. No mediastinal hilar adenopathy. Small calcified lymph nodes are in the mediastinum. Esophagus normal. Lungs/Pleura: Bilateral airspace disease within the RIGHT upper lobe and LEFT upper lobe as well as superior segment of the LEFT lower lobe. Airspace disease reaches confluence in the upper lobes. Bilateral pleural effusions which are moderate in volume. Several pulmonary nodules with partial calcification are present. For example RIGHT middle lobe nodule measuring 7 mm (image 106, series 8). Similar nodule in the  posterior RIGHT lower lobe measuring 9 mm on image 104, series 3. Upper Abdomen: Limited view of the liver, kidneys, pancreas are unremarkable. Normal adrenal glands. Musculoskeletal: No aggressive osseous lesion. IMPRESSION: 1. Bilateral multifocal pneumonia within the upper lobes. 2. Moderate bilateral pleural effusions. 3. Evidence of prior granulomatous disease with calcified mediastinal lymph nodes and partially calcified pulmonary nodules. These results will be called to the ordering clinician or representative by the Radiologist Assistant, and communication documented in the PACS or zVision Dashboard. Electronically Signed   By: Genevive Bi M.D.   On: 03/18/2017 10:51   US Renal  Result Date: 03/12/2017 CLINICAL DATA:  Acute renal failure superimposed on stage 3 chronic kidney disease. EXAM: RENAL / URINARY TRACT ULTRASOUND COMPLETE COMPARISON:  12/21/2015 FINDINGS: Right Kidney: Length: 11.3 cm. Mildly increased renal parenchymal echogenicity. No mass or hydronephrosis visualized. Left Kidney: Length: 11.0 cm. Mildly increased renal parenchymal echogenicity. No mass or hydronephrosis visualized. Bladder: Appears normal for degree of bladder distention. IMPRESSION: Mildly increased renal parenchymal echogenicity, consistent with medical renal disease. No evidence of hydronephrosis. Electronically Signed   By: Myles Rosenthal M.D.   On: 03/12/2017 13:27    Mr Cardiac Morphology W Wo Contrast  Result Date: 03/17/2017 CLINICAL DATA:  Ischemic cardiomyopathy, assess for viability. EXAM: CARDIAC MRI TECHNIQUE: The patient was  scanned on a 1.5 Tesla GE magnet. A dedicated cardiac coil was used. Functional imaging was done using Fiesta sequences. 2,3, and 4 chamber views were done to assess for RWMA's. Modified Simpson's rule using a short axis stack was used to calculate an ejection fraction on a dedicated work Research officer, trade unionstation using Circle software. The patient received 28 cc of Multihance. After 10 minutes  inversion recovery sequences were used to assess for infiltration and scar tissue. CONTRAST:  28 cc Multihance contrast. FINDINGS: There were small to moderate bilateral pleural effusions. There was a small, primarily inferior pericardial effusion. Normal left ventricular size and wall thickness. Mid to apical anteroseptal severe hypokinesis, severe hypokinesis of the apical lateral wall, severe hypokinesis of the mid anterior wall with akinesis of the apical anterior wall, inferolateral hypokinesis, severe hypokinesis of the true apex. Normal right ventricular size and systolic function. Normal right and left atrial sizes. No significant mitral regurgitation noted. Trileaflet aortic valve with no stenosis or regurgitation. Delayed enhancement imaging: 26-50% wall thickness subendocardial late gadolinium enhancement (LGE) in the mid anterior and apical anterior walls. 50% wall thickness subendocardial LGE at the true apex. Measurements: LVEDV 188 mL LVSV 64 mL LVEF 34% IMPRESSION: 1.  Small to moderate bilateral pleural effusions. 2. Normal LV size with wall motion abnormalities as noted above. EF 34%. 3.  Normal RV size and systolic function. 4. Delayed enhancement pattern suggests that hypokinetic segments of the LV have a good chance of improving with revascularization (all segments would be expected to be viable except for the true apex which is questionable). Dalton Mclean Electronically Signed   By: Marca Anconaalton  Mclean M.D.   On: 03/17/2017 23:51   Discharge Instructions    Amb Referral to Cardiac Rehabilitation    Complete by:  As directed    Diagnosis:  CABG   CABG X ___:  4      Discharge Medications: Allergies as of 03/27/2017      Reactions   No Known Allergies       Medication List    STOP taking these medications   Cyanocobalamin 2500 MCG Tabs   DAYQUIL MULTI-SYMPTOM PO     TAKE these medications   ALKA-SELTZER PLS SINUS & COUGH PO Take 1 tablet by mouth daily as needed.     amLODipine 5 MG tablet Commonly known as:  NORVASC Take 1 tablet (5 mg total) by mouth daily. What changed:  when to take this   aspirin EC 81 MG tablet Take 81 mg by mouth daily.   B-12 PO Take 1 tablet by mouth daily.   carvedilol 6.25 MG tablet Commonly known as:  COREG Take 6.25 mg by mouth 2 (two) times daily with a meal.   citalopram 20 MG tablet Commonly known as:  CELEXA Take 20 mg by mouth daily.   clopidogrel 75 MG tablet Commonly known as:  PLAVIX Take 1 tablet (75 mg total) by mouth daily.   furosemide 40 MG tablet Commonly known as:  LASIX Take 1 tablet (40 mg total) by mouth daily. Start taking on:  03/28/2017 What changed:  additional instructions   glimepiride 4 MG tablet Commonly known as:  AMARYL Take 4 mg by mouth daily with breakfast.   metFORMIN 500 MG tablet Commonly known as:  GLUCOPHAGE Take by mouth 2 (two) times daily with a meal.   oxyCODONE 5 MG immediate release tablet Commonly known as:  Oxy IR/ROXICODONE Take 5 mg by mouth every 4-6 hours PRN severe pain.   rosuvastatin  5 MG tablet Commonly known as:  CRESTOR Take 1 tablet (5 mg total) by mouth at bedtime. What changed:  when to take this   tamsulosin 0.4 MG Caps capsule Commonly known as:  FLOMAX Take 0.4 mg by mouth.   triamcinolone ointment 0.1 % Commonly known as:  KENALOG Apply 1 application topically 2 (two) times daily.   Zinc 50 MG Tabs Take 1 tablet by mouth daily.      The patient has been discharged on:   1.Beta Blocker:  Yes [   ]                              No   [x   ]                              If No, reason:  2.Ace Inhibitor/ARB: Yes [   ]                                     No  [  x  ]                                     If No, reason:Elevated creatinine 1.87  3.Statin:   Yes [ x  ]                  No  [   ]                  If No, reason:  4.Ecasa:  Yes  [ x  ]                  No   [   ]                  If No, reason:  Follow Up  Appointments: Follow-up Information    Jonelle Sidle, MD. Call in 2 week(s).   Specialty:  Cardiology Why:  Call for a follow up appointment for 2 weeks Contact information: [address] [place] [address] [phone]        Kerin Perna, MD Follow up on 04/27/2017.   Specialty:  Cardiothoracic Surgery Why:  PA/LAT CXR to be taken (at [facility] which is in the same building as Dr. Zenaida Niece Trigt's office) on 04/27/2017 at 11:00 am;Appointment time is at 11:30 am Contact information: [address] [place] [address] [phone]        Benita Stabile, MD. Schedule an appointment as soon as possible for a visit in 1 month(s).   Specialty:  Internal Medicine Why:  Call to have creatinine checked as was still elevated post op at 1.87 (but decreasing daily) and further diabetes managment. Contact information: [address] [place] [address] [phone]           Signed: Doree Fudge MPA-C 03/27/2017, 10:31 AM

## 2017-03-24 NOTE — Progress Notes (Signed)
CARDIAC REHAB PHASE I   PRE:  Rate/Rhythm: 75 SR    BP: sitting 145/82    SaO2: 97 RA  MODE:  Ambulation: 1240 ft   POST:  Rate/Rhythm: 85 SR    BP: sitting 141/95     SaO2: 96 RA  Pt needed assist to stand but then ambulated with supervision assist long distance at moderate pace. Feels well, no c/o. Able to walk and talk. Return to recliner.  Encouraged x1-2 more walks and IS. [id number]  Harriet MassonRandi Kristan Amaan Meyer CES, ACSM 03/24/2017 2:31 PM

## 2017-03-24 NOTE — Progress Notes (Signed)
2 Days Post-Op Procedure(s) (LRB): CORONARY ARTERY BYPASS GRAFTING (CABG)x4 using left internal mammary artery, cryo shapenous vein and left greater saphenous vein harvested endoscopically.  LIMA-LAD SVG-DIAD CRYOVEIN -OM2 CRYOVEIN -OM1 EVH LEFT THIGH, RIGHT GSV EXPLORED -TOO SMALL TO USE (N/A) TRANSESOPHAGEAL ECHOCARDIOGRAM (TEE) (N/A) Subjective: Ischemic CM s/p CABG, DM, cryovein x2 Progressing well Creat up to2.4 , making urine Objective: Vital signs in last 24 hours: Temp:  [97.7 F (36.5 C)-98.8 F (37.1 C)] 97.7 F (36.5 C) (05/10 0700) Pulse Rate:  [64-80] 64 (05/10 0600) Cardiac Rhythm: Normal sinus rhythm (05/10 0400) Resp:  [12-20] 16 (05/10 0600) BP: (90-132)/(58-81) 124/75 (05/10 0600) SpO2:  [92 %-100 %] 97 % (05/10 0600) Weight:  [190 lb (86.2 kg)] 190 lb (86.2 kg) (05/10 0500)  Hemodynamic parameters for last 24 hours: PAP: (20)/(9) 20/9  Intake/Output from previous day: 05/09 0701 - 05/10 0700 In: 1110.1 [P.O.:240; I.V.:720.1; IV Piggyback:150] Out: 1410 [Urine:1210; Chest Tube:200] Intake/Output this shift: No intake/output data recorded.       Exam    General- alert and comfortable   Lungs- clear without rales, wheezes   Cor- regular rate and rhythm, no murmur , gallop   Abdomen- soft, non-tender   Extremities - warm, non-tender, minimal edema   Neuro- oriented, appropriate, no focal weakness   Lab Results:  Recent Labs  03/23/17 1639 03/23/17 1645 03/24/17 0326  WBC 25.2*  --  20.4*  HGB 9.7* 9.2* 8.6*  HCT 29.4* 27.0* 26.1*  PLT 195  --  171   BMET:  Recent Labs  03/23/17 0400  03/23/17 1645 03/24/17 0326  NA 136  --  132* 134*  K 5.0  --  4.7 4.3  CL 106  --  102 103  CO2 20*  --   --  24  GLUCOSE 147*  --  245* 159*  BUN 27*  --  30* 34*  CREATININE 1.80*  < > 2.10* 2.36*  CALCIUM 8.0*  --   --  8.1*  < > = values in this interval not displayed.  PT/INR:  Recent Labs  03/22/17 1430  LABPROT 16.1*  INR 1.28   ABG     Component Value Date/Time   PHART 7.423 03/23/2017 0735   HCO3 22.7 03/23/2017 0735   TCO2 23 03/23/2017 1645   ACIDBASEDEF 1.0 03/23/2017 0735   O2SAT 93.0 03/23/2017 0735   CBG (last 3)   Recent Labs  03/24/17 0000 03/24/17 0326 03/24/17 0734  GLUCAP 191* 154* 76    Assessment/Plan: S/P Procedure(s) (LRB): CORONARY ARTERY BYPASS GRAFTING (CABG)x4 using left internal mammary artery, cryo shapenous vein and left greater saphenous vein harvested endoscopically.  LIMA-LAD SVG-DIAD CRYOVEIN -OM2 CRYOVEIN -OM1 EVH LEFT THIGH, RIGHT GSV EXPLORED -TOO SMALL TO USE (N/A) TRANSESOPHAGEAL ECHOCARDIOGRAM (TEE) (N/A) Mobilize Diuresis Diabetes control Plan for transfer to step-down: see transfer orders plavix for cryovein  Wean off milrinone   LOS: 13 days    Kathlee Nationseter Van Trigt III 03/24/2017

## 2017-03-25 ENCOUNTER — Inpatient Hospital Stay (HOSPITAL_COMMUNITY): Payer: BLUE CROSS/BLUE SHIELD

## 2017-03-25 LAB — CBC
HCT: 28.7 % — ABNORMAL LOW (ref 39.0–52.0)
Hemoglobin: 9.7 g/dL — ABNORMAL LOW (ref 13.0–17.0)
MCH: 30.6 pg (ref 26.0–34.0)
MCHC: 33.8 g/dL (ref 30.0–36.0)
MCV: 90.5 fL (ref 78.0–100.0)
Platelets: 227 10*3/uL (ref 150–400)
RBC: 3.17 MIL/uL — ABNORMAL LOW (ref 4.22–5.81)
RDW: 15.2 % (ref 11.5–15.5)
WBC: 19.3 10*3/uL — ABNORMAL HIGH (ref 4.0–10.5)

## 2017-03-25 LAB — BASIC METABOLIC PANEL
Anion gap: 9 (ref 5–15)
BUN: 36 mg/dL — ABNORMAL HIGH (ref 6–20)
CO2: 26 mmol/L (ref 22–32)
Calcium: 8.1 mg/dL — ABNORMAL LOW (ref 8.9–10.3)
Chloride: 102 mmol/L (ref 101–111)
Creatinine, Ser: 2.33 mg/dL — ABNORMAL HIGH (ref 0.61–1.24)
GFR calc Af Amer: 34 mL/min — ABNORMAL LOW (ref >60)
GFR calc non Af Amer: 29 mL/min — ABNORMAL LOW (ref >60)
Glucose, Bld: 44 mg/dL — CL (ref 65–99)
Potassium: 4.2 mmol/L (ref 3.5–5.1)
Sodium: 137 mmol/L (ref 135–145)

## 2017-03-25 LAB — GLUCOSE, CAPILLARY
Glucose-Capillary: 24 mg/dL — CL (ref 65–99)
Glucose-Capillary: 131 mg/dL — ABNORMAL HIGH (ref 65–99)
Glucose-Capillary: 161 mg/dL — ABNORMAL HIGH (ref 65–99)
Glucose-Capillary: 177 mg/dL — ABNORMAL HIGH (ref 65–99)
Glucose-Capillary: 140 mg/dL — ABNORMAL HIGH (ref 65–99)
Glucose-Capillary: 73 mg/dL (ref 65–99)

## 2017-03-25 MED ORDER — DEXTROSE 50 % IV SOLN
INTRAVENOUS | Status: AC
  Administered 2017-03-25: 50 mL
  Filled 2017-03-25: qty 50

## 2017-03-25 NOTE — Progress Notes (Signed)
CARDIAC REHAB PHASE I   PRE:  Rate/Rhythm: 64 SR  BP:  Supine:   Sitting: 148/74  Standing:    SaO2: 96%RA  MODE:  Ambulation: 1530 ft   POST:  Rate/Rhythm: 74 SR  BP:  Supine:   Sitting: 147/78  Standing:    SaO2: 98%RA [id number] Pt walked 1530 ft with steady gait. Tolerated well. Needed a little assistance to stand. Wrote down how to view discharge video. Education completed re ex ed, watching carbs, sodium and heart healthy diet. Pt had been using carb counting to keep A1C down. Encouraged sternal precautions and IS. Since EF low prior to surgery, encouraged daily weights and watching for signs of fluid retention. Discussed CRP 2 and referring to [place].   Luetta Nuttingharlene Kaine Mcquillen, RN BSN  03/25/2017 9:18 AM

## 2017-03-25 NOTE — Progress Notes (Addendum)
      [address]       [place] [id number]             [phone]      3 Days Post-Op Procedure(s) (LRB): CORONARY ARTERY BYPASS GRAFTING (CABG)x4 using left internal mammary artery, cryo shapenous vein and left greater saphenous vein harvested endoscopically.  LIMA-LAD SVG-DIAD CRYOVEIN -OM2 CRYOVEIN -OM1 EVH LEFT THIGH, RIGHT GSV EXPLORED -TOO SMALL TO USE (N/A) TRANSESOPHAGEAL ECHOCARDIOGRAM (TEE) (N/A)   Subjective: [name] is doing very well.  He has no specific complaints.   Objective: Vital signs in last 24 hours: Temp:  [97.4 F (36.3 C)-98.3 F (36.8 C)] 97.6 F (36.4 C) (05/11 0515) Pulse Rate:  [58-78] 73 (05/11 0515) Cardiac Rhythm: Normal sinus rhythm (05/11 0700) Resp:  [16-18] 18 (05/11 0515) BP: (110-153)/(50-87) 110/50 (05/11 0515) SpO2:  [91 %-100 %] 91 % (05/11 0515) Weight:  [188 lb 4.8 oz (85.4 kg)] 188 lb 4.8 oz (85.4 kg) (05/11 0409)  Intake/Output from previous day: 05/10 0701 - 05/11 0700 In: 640 [P.O.:600; I.V.:40] Out: -   General appearance: alert, cooperative and no distress Heart: regular rate and rhythm Lungs: clear to auscultation bilaterally Abdomen: soft, non-tender; bowel sounds normal; no masses,  no organomegaly Extremities: edema 1+ pitting Wound: clean and dry  Lab Results:  Recent Labs  03/24/17 0326 03/25/17 0353  WBC 20.4* 19.3*  HGB 8.6* 9.7*  HCT 26.1* 28.7*  PLT 171 227   BMET:  Recent Labs  03/24/17 0326 03/25/17 0353  NA 134* 137  K 4.3 4.2  CL 103 102  CO2 24 26  GLUCOSE 159* 44*  BUN 34* 36*  CREATININE 2.36* 2.33*  CALCIUM 8.1* 8.1*    PT/INR:  Recent Labs  03/22/17 1430  LABPROT 16.1*  INR 1.28   ABG    Component Value Date/Time   PHART 7.423 03/23/2017 0735   HCO3 22.7 03/23/2017 0735   TCO2 23 03/23/2017 1645   ACIDBASEDEF 1.0 03/23/2017 0735   O2SAT 93.0 03/23/2017 0735   CBG (last 3)   Recent Labs  03/25/17 0519 03/25/17 0538 03/25/17 0617  GLUCAP 24*  131* 161*    Assessment/Plan: S/P Procedure(s) (LRB): CORONARY ARTERY BYPASS GRAFTING (CABG)x4 using left internal mammary artery, cryo shapenous vein and left greater saphenous vein harvested endoscopically.  LIMA-LAD SVG-DIAD CRYOVEIN -OM2 CRYOVEIN -OM1 EVH LEFT THIGH, RIGHT GSV EXPLORED -TOO SMALL TO USE (N/A) TRANSESOPHAGEAL ECHOCARDIOGRAM (TEE) (N/A)  1. CV- NSR with 1st degree AV Block-continue Coreg 2. Pulm- no acute issues, continue IS 3. Renal- creatinine at 2.33, remains hypervolemic, continue lasix 4. DM- hypoglycemic at 24 this morning, will d/c Levemir, continue SSIP... Creatinine too elevated to restart Metformin 5. Dispo- patient stable, need to continue to monitor creatinine, watch blood sugars, continue current care   LOS: 14 days    BARRETT, ERIN 03/25/2017  need to cont lasix, follow creat CXR clear and pt is ambulating well patient examined and medical record reviewed,agree with above note. Douglas Cannon 03/25/2017

## 2017-03-25 NOTE — Progress Notes (Signed)
Hypoglycemic Event  CBG: 44  Treatment: 15 GM carbohydrate snack  Symptoms: None  Follow-up CBG: Time:0519 CBG Result:24  CBG: 24  Treatment: D50 IV 50 mL  Symptoms: None  Follow-up CBG: [id number]:0538 CBG Result:131  Possible Reason: Medication Regimen     Gregor HamsAlisha Caydence Koenig, RN

## 2017-03-26 LAB — BASIC METABOLIC PANEL
Anion gap: 6 (ref 5–15)
BUN: 34 mg/dL — ABNORMAL HIGH (ref 6–20)
CO2: 28 mmol/L (ref 22–32)
Calcium: 8 mg/dL — ABNORMAL LOW (ref 8.9–10.3)
Chloride: 102 mmol/L (ref 101–111)
Creatinine, Ser: 1.96 mg/dL — ABNORMAL HIGH (ref 0.61–1.24)
GFR calc Af Amer: 41 mL/min — ABNORMAL LOW (ref >60)
GFR calc non Af Amer: 36 mL/min — ABNORMAL LOW (ref >60)
Glucose, Bld: 82 mg/dL (ref 65–99)
Potassium: 4.6 mmol/L (ref 3.5–5.1)
Sodium: 136 mmol/L (ref 135–145)

## 2017-03-26 LAB — GLUCOSE, CAPILLARY
Glucose-Capillary: 76 mg/dL (ref 65–99)
Glucose-Capillary: 170 mg/dL — ABNORMAL HIGH (ref 65–99)
Glucose-Capillary: 67 mg/dL (ref 65–99)
Glucose-Capillary: 147 mg/dL — ABNORMAL HIGH (ref 65–99)

## 2017-03-26 MED ORDER — AMLODIPINE BESYLATE 5 MG PO TABS
5.0000 mg | ORAL_TABLET | Freq: Every day | ORAL | Status: DC
  Administered 2017-03-26 – 2017-03-27 (×2): 5 mg via ORAL
  Filled 2017-03-26 (×2): qty 1

## 2017-03-26 NOTE — Progress Notes (Addendum)
      [address]       [place] [id number]             [phone]        4 Days Post-Op Procedure(s) (LRB): CORONARY ARTERY BYPASS GRAFTING (CABG)x4 using left internal mammary artery, cryo shapenous vein and left greater saphenous vein harvested endoscopically.  LIMA-LAD SVG-DIAD CRYOVEIN -OM2 CRYOVEIN -OM1 EVH LEFT THIGH, RIGHT GSV EXPLORED -TOO SMALL TO USE (N/A) TRANSESOPHAGEAL ECHOCARDIOGRAM (TEE) (N/A)  Subjective: Patient feeling "pretty good" today.  Objective: Vital signs in last 24 hours: Temp:  [97.9 F (36.6 C)-98.6 F (37 C)] 98.3 F (36.8 C) (05/12 0456) Pulse Rate:  [68-71] 68 (05/12 0802) Cardiac Rhythm: Normal sinus rhythm (05/12 0700) Resp:  [18-20] 18 (05/12 0456) BP: (118-157)/(74-84) 157/84 (05/12 0802) SpO2:  [95 %-98 %] 95 % (05/12 0456) Weight:  [84.6 kg (186 lb 6.4 oz)] 84.6 kg (186 lb 6.4 oz) (05/12 0456)  Pre op weight 79 kg Current Weight  03/26/17 84.6 kg (186 lb 6.4 oz)       Intake/Output from previous day: 05/11 0701 - 05/12 0700 In: 720 [P.O.:720] Out: -    Physical Exam:  Cardiovascular: RRR Pulmonary: Clear to auscultation bilaterally Abdomen: Soft, non tender, bowel sounds present. Extremities: Mild bilateral lower extremity edema L>R Wounds: Clean and dry.  No erythema or signs of infection.  Lab Results: CBC: Recent Labs  03/24/17 0326 03/25/17 0353  WBC 20.4* 19.3*  HGB 8.6* 9.7*  HCT 26.1* 28.7*  PLT 171 227   BMET:  Recent Labs  03/25/17 0353 03/26/17 0319  NA 137 136  K 4.2 4.6  CL 102 102  CO2 26 28  GLUCOSE 44* 82  BUN 36* 34*  CREATININE 2.33* 1.96*  CALCIUM 8.1* 8.0*    PT/INR:  Lab Results  Component Value Date   INR 1.28 03/22/2017   INR 1.15 03/15/2017   ABG:  INR: Will add last result for INR, ABG once components are confirmed Will add last 4 CBG results once components are confirmed  Assessment/Plan:  1. CV - SR in the 70's On Coreg 6.25 mg bid and Plavix 75  mg daily (cryo vein). Will restart Norvasc 5 mg daily for better BP control. 2.  Pulmonary - On room air. Encourage incentive spirometer. 3. Volume Overload - On Lasix 40 mg IV daily 4.  Acute blood loss anemia - H and H yesterday stable at 9.7 and 2.7 5. Creatinine decreased from 2.33 to 1.96. Recheck in am 6. DM-CBGs. 140/73/76. On Insulin only. Hope to restart Metformin and Amaryl if creatinine continues to decrease. 7. Remove EPW 8. Hopefully, home Monday  ZIMMERMAN,DONIELLE MPA-C 03/26/2017,8:15 AM  I have seen and examined the patient and agree with the assessment and plan as outlined.  Purcell Nailslarence H Owen, MD 03/26/2017 12:01 PM

## 2017-03-26 NOTE — Progress Notes (Signed)
Removed epicardial pacing wires per MD order. Two sutures removed, wires pulled. Wires intact. Sites cleansed, steri strips applied. No patient complaints. Vitals WNL. Bedrest over at 10:15a. Will continue to monitor. Minerva Endsiffany N Johniya Durfee RN

## 2017-03-27 LAB — BASIC METABOLIC PANEL
Anion gap: 7 (ref 5–15)
BUN: 33 mg/dL — ABNORMAL HIGH (ref 6–20)
CO2: 28 mmol/L (ref 22–32)
Calcium: 8.5 mg/dL — ABNORMAL LOW (ref 8.9–10.3)
Chloride: 101 mmol/L (ref 101–111)
Creatinine, Ser: 1.87 mg/dL — ABNORMAL HIGH (ref 0.61–1.24)
GFR calc Af Amer: 44 mL/min — ABNORMAL LOW (ref >60)
GFR calc non Af Amer: 38 mL/min — ABNORMAL LOW (ref >60)
Glucose, Bld: 120 mg/dL — ABNORMAL HIGH (ref 65–99)
Potassium: 4.9 mmol/L (ref 3.5–5.1)
Sodium: 136 mmol/L (ref 135–145)

## 2017-03-27 LAB — GLUCOSE, CAPILLARY
Glucose-Capillary: 127 mg/dL — ABNORMAL HIGH (ref 65–99)
Glucose-Capillary: 170 mg/dL — ABNORMAL HIGH (ref 65–99)

## 2017-03-27 MED ORDER — ROSUVASTATIN CALCIUM 5 MG PO TABS: 5.0000 mg | ORAL_TABLET | Freq: Every day | ORAL | Status: DC

## 2017-03-27 MED ORDER — OXYCODONE HCL 5 MG PO TABS
ORAL_TABLET | ORAL | 0 refills | Status: DC
Start: 2017-03-27 — End: 2017-05-17

## 2017-03-27 MED ORDER — FUROSEMIDE 40 MG PO TABS: 40.0000 mg | ORAL_TABLET | Freq: Every day | ORAL | 1 refills | Status: DC

## 2017-03-27 MED ORDER — AMLODIPINE BESYLATE 5 MG PO TABS: 5.0000 mg | ORAL_TABLET | Freq: Every day | ORAL | 1 refills | Status: DC

## 2017-03-27 MED ORDER — CLOPIDOGREL BISULFATE 75 MG PO TABS: 75.0000 mg | ORAL_TABLET | Freq: Every day | ORAL | 1 refills | Status: DC

## 2017-03-27 NOTE — Progress Notes (Signed)
Removed 2 chest tube sutures. Patient tolerated well. Sites cleansed with benzoin and steri strips applied. Educated patient on after care and incisional care.  Minerva Endsiffany N Josselin Gaulin RN

## 2017-03-27 NOTE — Progress Notes (Signed)
Discharged patient per MD order. IV removed, telemetry d/c, CCMD notified. AVS went over with patient and spouse including follow up appointment, medications, and prescriptions. Prescriptions provided. Patient educated on incisional care, bathing, exercize, and instructed to follow up with pcp regarding creatinine and diabetes control. No further questions at this time. Patient to home with spouse.  Minerva Endsiffany N Shaana Acocella RN

## 2017-03-27 NOTE — Progress Notes (Addendum)
      [address]       [place] [id number]             [phone]        5 Days Post-Op Procedure(s) (LRB): CORONARY ARTERY BYPASS GRAFTING (CABG)x4 using left internal mammary artery, cryo shapenous vein and left greater saphenous vein harvested endoscopically.  LIMA-LAD SVG-DIAD CRYOVEIN -OM2 CRYOVEIN -OM1 EVH LEFT THIGH, RIGHT GSV EXPLORED -TOO SMALL TO USE (N/A) TRANSESOPHAGEAL ECHOCARDIOGRAM (TEE) (N/A)  Subjective: Patient without complaints.  Objective: Vital signs in last 24 hours: Temp:  [97.8 F (36.6 C)-98.7 F (37.1 C)] 97.8 F (36.6 C) (05/13 0437) Pulse Rate:  [63-73] 71 (05/13 0800) Cardiac Rhythm: Normal sinus rhythm (05/13 0700) Resp:  [18] 18 (05/13 0437) BP: (114-146)/(57-77) 128/65 (05/13 0800) SpO2:  [97 %-98 %] 97 % (05/13 0437) Weight:  [83.5 kg (184 lb)] 83.5 kg (184 lb) (05/13 0437)  Pre op weight 79 kg Current Weight  03/27/17 83.5 kg (184 lb)       Intake/Output from previous day: 05/12 0701 - 05/13 0700 In: 480 [P.O.:480] Out: -    Physical Exam:  Cardiovascular: RRR Pulmonary: Clear to auscultation bilaterally Abdomen: Soft, non tender, bowel sounds present. Extremities: Mild bilateral lower extremity edema L>R Wounds: Clean and dry.  No erythema or signs of infection.  Lab Results: CBC:  Recent Labs  03/25/17 0353  WBC 19.3*  HGB 9.7*  HCT 28.7*  PLT 227   BMET:   Recent Labs  03/26/17 0319 03/27/17 0318  NA 136 136  K 4.6 4.9  CL 102 101  CO2 28 28  GLUCOSE 82 120*  BUN 34* 33*  CREATININE 1.96* 1.87*  CALCIUM 8.0* 8.5*    PT/INR:  Lab Results  Component Value Date   INR 1.28 03/22/2017   INR 1.15 03/15/2017   ABG:  INR: Will add last result for INR, ABG once components are confirmed Will add last 4 CBG results once components are confirmed  Assessment/Plan:  1. CV - SR in the 70's On Coreg 6.25 mg bid,  Norvasc 5 mg daily, and Plavix 75 mg daily (cryo vein).  2.  Pulmonary -  On room air. Encourage incentive spirometer. 3. Volume Overload - On Lasix 40 mg IV daily. Will change to orally at discharge. 4.  Acute blood loss anemia - Last H and H  stable at 9.7 and 2.7 5. Creatinine decreased from 1.96 to 1.87. 6. DM-CBGs. 140/73/76. On Insulin only. Pre op HGA1C 6.3 Will restart Metformin and Amaryl  as creatinine decreased 7. Discharge home.  Tyric Rodeheaver MPA-C 03/27/2017,8:37 AM

## 2017-03-31 NOTE — Addendum Note (Signed)
Addendum  created 03/31/17 0917 by Adair LaundryPaxton, Karrina Lye A, CRNA   Anesthesia Event edited

## 2017-04-07 ENCOUNTER — Other Ambulatory Visit (HOSPITAL_COMMUNITY)
Admission: RE | Admit: 2017-04-07 | Discharge: 2017-04-07 | Disposition: A | Discharge status: Home / Self Care | Payer: BLUE CROSS/BLUE SHIELD | Source: Ambulatory Visit | Attending: Cardiology | Admitting: Cardiology

## 2017-04-07 ENCOUNTER — Ambulatory Visit (INDEPENDENT_AMBULATORY_CARE_PROVIDER_SITE_OTHER): Payer: BLUE CROSS/BLUE SHIELD | Admitting: Cardiology

## 2017-04-07 ENCOUNTER — Encounter: Payer: Self-pay | Admitting: Cardiology

## 2017-04-07 VITALS — BP 152/72 | HR 82 | Ht 70.0 in | Wt 176.0 lb

## 2017-04-07 DIAGNOSIS — Z79899 Other long term (current) drug therapy: Secondary | ICD-10-CM | POA: Diagnosis not present

## 2017-04-07 DIAGNOSIS — I2510 Atherosclerotic heart disease of native coronary artery without angina pectoris: Secondary | ICD-10-CM

## 2017-04-07 DIAGNOSIS — N179 Acute kidney failure, unspecified: Secondary | ICD-10-CM

## 2017-04-07 DIAGNOSIS — I5022 Chronic systolic (congestive) heart failure: Secondary | ICD-10-CM

## 2017-04-07 LAB — BASIC METABOLIC PANEL
Anion gap: 8 (ref 5–15)
BUN: 24 mg/dL — ABNORMAL HIGH (ref 6–20)
CO2: 28 mmol/L (ref 22–32)
Calcium: 8.9 mg/dL (ref 8.9–10.3)
Chloride: 103 mmol/L (ref 101–111)
Creatinine, Ser: 1.64 mg/dL — ABNORMAL HIGH (ref 0.61–1.24)
GFR calc Af Amer: 51 mL/min — ABNORMAL LOW (ref >60)
GFR calc non Af Amer: 44 mL/min — ABNORMAL LOW (ref >60)
Glucose, Bld: 96 mg/dL (ref 65–99)
Potassium: 3.8 mmol/L (ref 3.5–5.1)
Sodium: 139 mmol/L (ref 135–145)

## 2017-04-07 LAB — CBC WITH DIFFERENTIAL/PLATELET
Basophils Absolute: 0.1 10*3/uL (ref 0.0–0.1)
Basophils Relative: 1 %
Eosinophils Absolute: 0.6 10*3/uL (ref 0.0–0.7)
Eosinophils Relative: 6 %
HCT: 34.5 % — ABNORMAL LOW (ref 39.0–52.0)
Hemoglobin: 11.6 g/dL — ABNORMAL LOW (ref 13.0–17.0)
Lymphocytes Relative: 21 %
Lymphs Abs: 2.1 10*3/uL (ref 0.7–4.0)
MCH: 30.1 pg (ref 26.0–34.0)
MCHC: 33.6 g/dL (ref 30.0–36.0)
MCV: 89.6 fL (ref 78.0–100.0)
Monocytes Absolute: 0.7 10*3/uL (ref 0.1–1.0)
Monocytes Relative: 7 %
Neutro Abs: 6.6 10*3/uL (ref 1.7–7.7)
Neutrophils Relative %: 65 %
Platelets: 355 10*3/uL (ref 150–400)
RBC: 3.85 MIL/uL — ABNORMAL LOW (ref 4.22–5.81)
RDW: 13.8 % (ref 11.5–15.5)
WBC: 10 10*3/uL (ref 4.0–10.5)

## 2017-04-07 MED ORDER — CARVEDILOL 12.5 MG PO TABS: 12.5000 mg | ORAL_TABLET | Freq: Two times a day (BID) | ORAL | 3 refills | Status: DC

## 2017-04-07 NOTE — Patient Instructions (Addendum)
Medication Instructions:  Increase coreg 12.5 mg two times daily   Labwork: Today   Testing/Procedures: none  Follow-Up: Your physician recommends that you schedule a follow-up appointment in: 3 weeks    Any Other Special Instructions Will Be Listed Below (If Applicable).     If you need a refill on your cardiac medications before your next appointment, please call your pharmacy.

## 2017-04-07 NOTE — Progress Notes (Signed)
Clinical Summary Mr. Riffe is a 61 y.o.male seen today for follow up of the following medical problems.   1. CAD - admit with NSTEMI, 03/2017 cath showed multivessel CAD and referred for CABG - echo 02/2017 LVEF 30-35%, abnormal diastolic function, mild AI, mild MR - 03/22/17 4 vessel CABG (LIMA-LAD, SVG-Diag, SVG to OM1, SVG-OM2) - started on plavix due to use of cryo veins during surgery  - no recent chest pain. No SOB or DOE - compliant with meds  2. Chronic systolic HF/ICM - 02/2017 LVEF 16-10% - no recent LE edema, SOB/DOE  3. AKI on CKD - not on ACE-I, lisionpril previously stopped.  - followed by nephrology   SH: works at [employer] pl[employer] at [employer]. Past Medical History:  Diagnosis Date  . Anemia   . Anxiety   . CKD (chronic kidney disease) stage 3, GFR 30-59 ml/min   . Diastolic heart failure [facility])    Episode December 2017 in the setting of pneumonia  . Essential hypertension   . History of pneumonia   . Hyperlipemia   . Osteomyelitis (HCC)   . Type 2 diabetes mellitus (HCC)      Allergies  Allergen Reactions  . No Known Allergies      Current Outpatient Prescriptions  Medication Sig Dispense Refill  . amLODipine (NORVASC) 5 MG tablet Take 1 tablet (5 mg total) by mouth daily. 30 tablet 1  . aspirin EC 81 MG tablet Take 81 mg by mouth daily.    . carvedilol (COREG) 6.25 MG tablet Take 6.25 mg by mouth 2 (two) times daily with a meal.    . citalopram (CELEXA) 20 MG tablet Take 20 mg by mouth daily.    . clopidogrel (PLAVIX) 75 MG tablet Take 1 tablet (75 mg total) by mouth daily. 30 tablet 1  . Cyanocobalamin (B-12 PO) Take 1 tablet by mouth daily.    Marland Kitchen DM-Phenylephrine-Acetaminophen (ALKA-SELTZER PLS SINUS & COUGH PO) Take 1 tablet by mouth daily as needed.    . furosemide (LASIX) 40 MG tablet Take 1 tablet (40 mg total) by mouth daily. 30 tablet 1  . glimepiride (AMARYL) 4 MG tablet Take 4 mg by mouth daily with breakfast.    . metFORMIN (GLUCOPHAGE) 500  MG tablet Take by mouth 2 (two) times daily with a meal.    . oxyCODONE (OXY IR/ROXICODONE) 5 MG immediate release tablet Take 5 mg by mouth every 4-6 hours PRN severe pain. 30 tablet 0  . rosuvastatin (CRESTOR) 5 MG tablet Take 1 tablet (5 mg total) by mouth at bedtime.    . tamsulosin (FLOMAX) 0.4 MG CAPS capsule Take 0.4 mg by mouth.    . triamcinolone ointment (KENALOG) 0.1 % Apply 1 application topically 2 (two) times daily.    . Zinc 50 MG TABS Take 1 tablet by mouth daily.     No current facility-administered medications for this visit.      Past Surgical History:  Procedure Laterality Date  . AMPUTATION Right 12/24/2015   Procedure: PARTIAL THIRD RAY AMPUTATION OF THE RIGHT FOOT (AMPUTATION OF THE THIRD TOE AND A PORTION OF THE THIRD METATARSAL);  Surgeon: Ferman Hamming, DPM;  Location: [facility];  Service: Podiatry;  Laterality: Right;  . CORONARY ARTERY BYPASS GRAFT N/A 03/22/2017   Procedure: CORONARY ARTERY BYPASS GRAFTING (CABG)x4 using left internal mammary artery, cryo shapenous vein and left greater saphenous vein harvested endoscopically.  LIMA-LAD SVG-DIAD CRYOVEIN -OM2 CRYOVEIN -OM1 EVH LEFT THIGH, RIGHT GSV EXPLORED -TOO  SMALL TO USE;  Surgeon: Kerin Perna, MD;  Location: [facility] OR;  Service: Open Heart Surgery;  Laterality: N/A;  . LEFT HEART CATH AND CORONARY ANGIOGRAPHY N/A 03/15/2017   Procedure: Left Heart Cath and Coronary Angiography;  Surgeon: Lennette Bihari, MD;  Location: [facility] INVASIVE CV LAB;  Service: Cardiovascular;  Laterality: N/A;  . RIGHT HEART CATH N/A 03/18/2017   Procedure: Right Heart Cath;  Surgeon: Laurey Morale, MD;  Location: [facility] INVASIVE CV LAB;  Service: Cardiovascular;  Laterality: N/A;  . TEE WITHOUT CARDIOVERSION N/A 03/22/2017   Procedure: TRANSESOPHAGEAL ECHOCARDIOGRAM (TEE);  Surgeon: Donata Clay, Theron Arista, MD;  Location: [facility] OR;  Service: Open Heart Surgery;  Laterality: N/A;     Allergies  Allergen Reactions  . No Known Allergies       Family  History  Problem Relation Age of Onset  . Adopted: Yes     Social History Mr. Vanetten reports that he has never smoked. He has never used smokeless tobacco. Mr. Weakland reports that he does not drink alcohol.   Review of Systems CONSTITUTIONAL: No weight loss, fever, chills, weakness or fatigue.  HEENT: Eyes: No visual loss, blurred vision, double vision or yellow sclerae.No hearing loss, sneezing, congestion, runny nose or sore throat.  SKIN: No rash or itching.  CARDIOVASCULAR: per hpi RESPIRATORY: No shortness of breath, cough or sputum.  GASTROINTESTINAL: No anorexia, nausea, vomiting or diarrhea. No abdominal pain or blood.  GENITOURINARY: No burning on urination, no polyuria NEUROLOGICAL: No headache, dizziness, syncope, paralysis, ataxia, numbness or tingling in the extremities. No change in bowel or bladder control.  MUSCULOSKELETAL: No muscle, back pain, joint pain or stiffness.  LYMPHATICS: No enlarged nodes. No history of splenectomy.  PSYCHIATRIC: No history of depression or anxiety.  ENDOCRINOLOGIC: No reports of sweating, cold or heat intolerance. No polyuria or polydipsia.  Marland Kitchen   Physical Examination Vitals:   04/07/17 1304  BP: (!) 152/72  Pulse: 82   Vitals:   04/07/17 1304  Weight: 176 lb (79.8 kg)  Height: 5\' 10"  (1.778 m)    Gen: resting comfortably, no acute distress HEENT: no scleral icterus, pupils equal round and reactive, no palptable cervical adenopathy,  CV: RRR, no m/r/,g no jvd Resp: Clear to auscultation bilaterally GI: abdomen is soft, non-tender, non-distended, normal bowel sounds, no hepatosplenomegaly MSK: extremities are warm, no edema.  Skin: warm, no rash Neuro:  no focal deficits Psych: appropriate affect   Diagnostic Studies 03/2017 cath  LM lesion, 90 %stenosed.  Ost LAD to Prox LAD lesion, 95 %stenosed.  Ost 1st Diag to 1st Diag lesion, 90 %stenosed.  Ost 3rd Mrg to 3rd Mrg lesion, 95 %stenosed.  Mid Cx to Dist Cx  lesion, 90 %stenosed.  RPDA lesion, 85 %stenosed.  Mid RCA lesion, 20 %stenosed.  Acute Mrg lesion, 95 %stenosed.   Findings are compatible with a significant ischemic cardiomyopathy in this patient with echo Doppler documentation of an EF of 30-35%.  Severe multivessel CAD with coronary calcification and diffuse 90-95% near ostial to mid LAD stenoses with diffuse 90% stenosis in the first diagonal vessel; 95 and 90% diffuse stenoses in segmental obtuse marginal branches of the left circumflex coronary artery with good distal targets, and diffuse 80% to 90% PDA stenosis with 20% mid RCA stenosis and 95% proximal stenosis and a small proximal Raymund Manrique of the RCA.  LVEDP 34 mm Hg.  RECOMMENDATION: Surgical consultation will be obtained for CABG revascularization surgery.    Assessment and Plan  1. CAD - s/p CABG, overall doing well - we will continue current meds - he would like to get back to work on a limited basis for financial reasons. Desk job requiring limited physical activity. We will clear him to return with limited duties, continue general restrictions per CT surgery - will need referral to cardiac rehab  2. Chronic systolic HF - no current symptoms - we will increase coreg to 12.5mg  bid. No ACE/ARB/aldactone due to renal function, we will repeat BMET - will need repeat echo after trial of medical therapy to assess possible need for icd  3. AKI on CKD - repeat labs  F/u 3 weeks      Antoine PocheJonathan F. Ashtin Melichar, M.D.

## 2017-04-27 ENCOUNTER — Ambulatory Visit: Payer: BLUE CROSS/BLUE SHIELD | Admitting: Cardiothoracic Surgery

## 2017-04-28 ENCOUNTER — Ambulatory Visit: Payer: BLUE CROSS/BLUE SHIELD | Admitting: Cardiology

## 2017-04-29 ENCOUNTER — Other Ambulatory Visit: Payer: Self-pay | Admitting: Cardiothoracic Surgery

## 2017-04-29 DIAGNOSIS — Z951 Presence of aortocoronary bypass graft: Secondary | ICD-10-CM

## 2017-05-02 ENCOUNTER — Ambulatory Visit
Admission: RE | Admit: 2017-05-02 | Discharge: 2017-05-02 | Disposition: A | Discharge status: Home / Self Care | Payer: BLUE CROSS/BLUE SHIELD | Source: Ambulatory Visit | Attending: Cardiothoracic Surgery | Admitting: Cardiothoracic Surgery

## 2017-05-02 ENCOUNTER — Ambulatory Visit (INDEPENDENT_AMBULATORY_CARE_PROVIDER_SITE_OTHER): Payer: Self-pay | Admitting: Surgical

## 2017-05-02 VITALS — BP 180/90 | HR 69 | Resp 20 | Ht 70.0 in | Wt 180.0 lb

## 2017-05-02 DIAGNOSIS — I10 Essential (primary) hypertension: Secondary | ICD-10-CM

## 2017-05-02 DIAGNOSIS — Z951 Presence of aortocoronary bypass graft: Secondary | ICD-10-CM

## 2017-05-02 NOTE — Assessment & Plan Note (Signed)
Needs better long term control

## 2017-05-02 NOTE — Progress Notes (Signed)
[address]       [place] [id number]             [phone]      [facility] Medical Record #[id number] Date of Birth: 08/28/1956  Referring: Jonelle SidleMcDowell, Samuel G, MD Primary Care: Benita StabileHall, John Z, MD  Chief Complaint:   POST OP FOLLOW UP  DATE OF PROCEDURE:  03/22/2017 DATE OF DISCHARGE:                              OPERATIVE REPORT   OPERATION: 1. Coronary artery bypass grafting x4 (left internal mammary artery to     LAD, native reverse saphenous vein graft to diagonal, cryopreserved     saphenous vein allograft to OM 1, cryopreserved saphenous vein     allograft to OM 2). 2. Endoscopic harvest of left leg saphenous vein with bilateral     exploration of saphenous vein revealing no other adequate conduit,     thus requiring cryopreserved vein. 3. Placement of left femoral A-line for blood pressure monitoring.  SURGEON:  Kerin PernaPeter Van Trigt, MD.  ASSISTANT:  Rowe ClackWayne E. Gold, PA-C.  PREOPERATIVE DIAGNOSES: 1. Ischemic cardiomyopathy, ejection fraction 20% with class IV heart     failure and severe edema. 2. Diabetic pattern, severe 3-vessel coronary artery disease. 3. Diabetes mellitus.  POSTOPERATIVE DIAGNOSES: 1. Ischemic cardiomyopathy, ejection fraction 20% with class IV heart     failure and severe edema. 2. Diabetic pattern, severe 3-vessel coronary artery disease. 3. Diabetes mellitus. History of Present Illness:    The patient is a 61 year old male status post the above described procedure. He is seen in the office on today's date in routine follow-up. He states that he feels quite well and is not having any significant difficulties. He had no problems with wound healing. He is gradually increasing his ambulation and walking approximately 2 miles per day. He is no longer taking any pain medication. He does have some long-term lower extremity edema. He has been seen by cardiology and is scheduled to see them again on July 3. The  last visit date did increase his Coreg. He does check his sugars as well as blood pressure at home and both are moderately elevated.      Past Medical History:  Diagnosis Date  . Anemia   . Anxiety   . CKD (chronic kidney disease) stage 3, GFR 30-59 ml/min   . Diastolic heart failure [facility](HCC)    Episode December 2017 in the setting of pneumonia  . Essential hypertension   . History of pneumonia   . Hyperlipemia   . Osteomyelitis (HCC)   . Type 2 diabetes mellitus (HCC)      History  Smoking Status  . Never Smoker  Smokeless Tobacco  . Never Used    History  Alcohol Use No     Allergies  Allergen Reactions  . No Known Allergies     Current Outpatient Prescriptions  Medication Sig Dispense Refill  . amLODipine (NORVASC) 5 MG tablet Take 1 tablet (5 mg total) by mouth daily. 30 tablet 1  . aspirin EC 81 MG tablet Take 81 mg by mouth daily.    . carvedilol (COREG) 12.5 MG tablet Take 1 tablet (12.5 mg total) by mouth 2 (two) times daily. 180 tablet 3  . citalopram (CELEXA) 20 MG tablet Take 20 mg by mouth daily.    . clopidogrel (PLAVIX)  75 MG tablet Take 1 tablet (75 mg total) by mouth daily. 30 tablet 1  . Cyanocobalamin (B-12 PO) Take 1 tablet by mouth daily.    Marland Kitchen DM-Phenylephrine-Acetaminophen (ALKA-SELTZER PLS SINUS & COUGH PO) Take 1 tablet by mouth daily as needed.    . furosemide (LASIX) 40 MG tablet Take 1 tablet (40 mg total) by mouth daily. 30 tablet 1  . glimepiride (AMARYL) 4 MG tablet Take 4 mg by mouth daily with breakfast.    . metFORMIN (GLUCOPHAGE) 500 MG tablet Take by mouth 2 (two) times daily with a meal.    . rosuvastatin (CRESTOR) 5 MG tablet Take 1 tablet (5 mg total) by mouth at bedtime.    . tamsulosin (FLOMAX) 0.4 MG CAPS capsule Take 0.4 mg by mouth.    . triamcinolone ointment (KENALOG) 0.1 % Apply 1 application topically 2 (two) times daily.    . Zinc 50 MG TABS Take 1 tablet by mouth daily.    Marland Kitchen oxyCODONE (OXY IR/ROXICODONE) 5 MG immediate  release tablet Take 5 mg by mouth every 4-6 hours PRN severe pain. (Patient not taking: Reported on 05/02/2017) 30 tablet 0   No current facility-administered medications for this visit.        Physical Exam: BP (!) 180/90   Pulse 69   Resp 20   Ht 5\' 10"  (1.778 m)   Wt 180 lb (81.6 kg)   SpO2 99% Comment: RA  BMI 25.83 kg/m   General appearance: alert, cooperative and no distress Heart: regular rate and rhythm Lungs: clear to auscultation bilaterally Abdomen: soft, non-tender; bowel sounds normal; no masses,  no organomegaly Extremities: + LE edema Wound: incis well healed   Diagnostic Studies & Laboratory data:     Recent Radiology Findings:   Dg Chest 2 View  Result Date: 05/02/2017 CLINICAL DATA:  Follow-up CABG, history hypertension, diabetes mellitus, heart failure EXAM: CHEST  2 VIEW COMPARISON:  03/25/2017 FINDINGS: Upper normal heart size post CABG. Mediastinal contours and pulmonary vascularity normal. Improved perihilar infiltrates with mild residual in the RIGHT upper lobe. Remaining lungs clear. No pleural effusion or pneumothorax. Bones unremarkable. IMPRESSION: Postsurgical changes of CABG. Improved pulmonary infiltrates with mild residual noted in RIGHT upper lobe. Electronically Signed   By: Ulyses Southward M.D.   On: 05/02/2017 13:20      Recent Lab Findings: Lab Results  Component Value Date   WBC 10.0 04/07/2017   HGB 11.6 (L) 04/07/2017   HCT 34.5 (L) 04/07/2017   PLT 355 04/07/2017   GLUCOSE 96 04/07/2017   CHOL 103 03/12/2017   TRIG 114 03/12/2017   HDL 45 03/12/2017   LDLCALC 35 03/12/2017   ALT 10 (L) 03/18/2017   AST 16 03/18/2017   NA 139 04/07/2017   K 3.8 04/07/2017   CL 103 04/07/2017   CREATININE 1.64 (H) 04/07/2017   BUN 24 (H) 04/07/2017   CO2 28 04/07/2017   TSH 2.600 03/12/2017   INR 1.28 03/22/2017   HGBA1C 6.3 (H) 03/22/2017      Assessment / Plan:  Doing well from a surgical perspective. There are no current surgically  related issues. He will continue close and aggressive cardiology follow-up. We did have a long discussion about carbohydrate modification of his diet as it is playing a huge role in addition to his metabolic syndrome in his cardiovascular disease. I did not make any medication adjustments at this time as his home diary shows that his blood pressure has been Met[place] then today  in the office and he is again being seen fairly seen by the cardiologist. We will see again on a when necessary basis.          GOLD,WAYNE E, PA-C 05/02/2017 1:55 PM

## 2017-05-02 NOTE — Patient Instructions (Signed)
Discussed activity progression including lifting as well as driving.

## 2017-05-17 ENCOUNTER — Ambulatory Visit (INDEPENDENT_AMBULATORY_CARE_PROVIDER_SITE_OTHER): Payer: BLUE CROSS/BLUE SHIELD | Admitting: Cardiology

## 2017-05-17 ENCOUNTER — Encounter: Payer: Self-pay | Admitting: Cardiology

## 2017-05-17 VITALS — BP 182/100 | HR 77 | Ht 70.0 in | Wt 180.0 lb

## 2017-05-17 DIAGNOSIS — I2510 Atherosclerotic heart disease of native coronary artery without angina pectoris: Secondary | ICD-10-CM | POA: Diagnosis not present

## 2017-05-17 DIAGNOSIS — I5022 Chronic systolic (congestive) heart failure: Secondary | ICD-10-CM

## 2017-05-17 DIAGNOSIS — N179 Acute kidney failure, unspecified: Secondary | ICD-10-CM

## 2017-05-17 MED ORDER — CARVEDILOL 25 MG PO TABS
25.0000 mg | ORAL_TABLET | Freq: Two times a day (BID) | ORAL | 3 refills | Status: DC
Start: 2017-05-17 — End: 2017-08-01

## 2017-05-17 NOTE — Patient Instructions (Signed)
Your physician recommends that you schedule a follow-up appointment in:  3 weeks with Dr Wyline MoodBranch    INCREASE Coreg to 25 mg twice a day     No labs or testing today.      Thank you for choosing [facility] !

## 2017-05-17 NOTE — Progress Notes (Signed)
Clinical Summary Mr. Dufrane is a 61 y.o.male seen today for follow up of the following medical problems.    1. CAD - admit with NSTEMI, 03/2017 cath showed multivessel CAD and referred for CABG - echo 02/2017 LVEF 30-35%, abnormal diastolic function, mild AI, mild MR - 03/22/17 4 vessel CABG (LIMA-LAD, SVG-Diag, SVG to OM1, SVG-OM2) - started on plavix due to use of cryo veins during surgery and presenting with NSTEMI   - no recent chest pain. No SOB or DOE - walks every morning 15-62minutes without troubles - compliant with meds.   2. Chronic systolic HF/ICM - 02/2017 LVEF 78-46% - no recent LE edema, SOB/DOE  - occasional LE edema that comes and goes - compliant with meds. Coreg increased to 12.5mg  bid.  - weight stable 178-180 lbs - starting cardiac rehab next week - L>R swelling for many years.   3. AKI on CKD - not on ACE-I, lisionpril previously stopped.  - has not gone to see renal yet, awaiting results of f/u labs  4. HTN - home cuff off by 15, adjusted 130s-140s/80s.   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   Past Medical History:  Diagnosis Date  . Anemia   . Anxiety   . CKD (chronic kidney disease) stage 3, GFR 30-59 ml/min   . Diastolic heart failure [facility])    Episode December 2017 in the setting of pneumonia  . Essential hypertension   . History of pneumonia   . Hyperlipemia   . Osteomyelitis (HCC)   . Type 2 diabetes mellitus (HCC)      Allergies  Allergen Reactions  . No Known Allergies      Current Outpatient Prescriptions  Medication Sig Dispense Refill  . amLODipine (NORVASC) 5 MG tablet Take 1 tablet (5 mg total) by mouth daily. 30 tablet 1  . aspirin EC 81 MG tablet Take 81 mg by mouth daily.    . carvedilol (COREG) 12.5 MG tablet Take 1 tablet (12.5 mg total) by mouth 2 (two) times daily. 180 tablet 3  . citalopram (CELEXA) 20 MG tablet Take 20 mg by mouth daily.    . clopidogrel (PLAVIX) 75 MG tablet Take 1 tablet (75 mg total)  by mouth daily. 30 tablet 1  . Cyanocobalamin (B-12 PO) Take 1 tablet by mouth daily.    Marland Kitchen DM-Phenylephrine-Acetaminophen (ALKA-SELTZER PLS SINUS & COUGH PO) Take 1 tablet by mouth daily as needed.    . furosemide (LASIX) 40 MG tablet Take 1 tablet (40 mg total) by mouth daily. 30 tablet 1  . glimepiride (AMARYL) 4 MG tablet Take 4 mg by mouth daily with breakfast.    . metFORMIN (GLUCOPHAGE) 500 MG tablet Take by mouth 2 (two) times daily with a meal.    . oxyCODONE (OXY IR/ROXICODONE) 5 MG immediate release tablet Take 5 mg by mouth every 4-6 hours PRN severe pain. (Patient not taking: Reported on 05/02/2017) 30 tablet 0  . rosuvastatin (CRESTOR) 5 MG tablet Take 1 tablet (5 mg total) by mouth at bedtime.    . tamsulosin (FLOMAX) 0.4 MG CAPS capsule Take 0.4 mg by mouth.    . triamcinolone ointment (KENALOG) 0.1 % Apply 1 application topically 2 (two) times daily.    . Zinc 50 MG TABS Take 1 tablet by mouth daily.     No current facility-administered medications for this visit.      Past Surgical History:  Procedure Laterality Date  . AMPUTATION Right 12/24/2015   Procedure:  PARTIAL THIRD RAY AMPUTATION OF THE RIGHT FOOT (AMPUTATION OF THE THIRD TOE AND A PORTION OF THE THIRD METATARSAL);  Surgeon: Ferman Hamming, DPM;  Location: [facility]S;  Service: Podiatry;  Laterality: Right;  . CORONARY ARTERY BYPASS GRAFT N/A 03/22/2017   Procedure: CORONARY ARTERY BYPASS GRAFTING (CABG)x4 using left internal mammary artery, cryo shapenous vein and left greater saphenous vein harvested endoscopically.  LIMA-LAD SVG-DIAD CRYOVEIN -OM2 CRYOVEIN -OM1 EVH LEFT THIGH, RIGHT GSV EXPLORED -TOO SMALL TO USE;  Surgeon: Kerin Perna, MD;  Location: [facility] OR;  Service: Open Heart Surgery;  Laterality: N/A;  . LEFT HEART CATH AND CORONARY ANGIOGRAPHY N/A 03/15/2017   Procedure: Left Heart Cath and Coronary Angiography;  Surgeon: Lennette Bihari, MD;  Location: [facility] INVASIVE CV LAB;  Service: Cardiovascular;  Laterality:  N/A;  . RIGHT HEART CATH N/A 03/18/2017   Procedure: Right Heart Cath;  Surgeon: Laurey Morale, MD;  Location: [facility] INVASIVE CV LAB;  Service: Cardiovascular;  Laterality: N/A;  . TEE WITHOUT CARDIOVERSION N/A 03/22/2017   Procedure: TRANSESOPHAGEAL ECHOCARDIOGRAM (TEE);  Surgeon: Donata Clay, Theron Arista, MD;  Location: [facility] OR;  Service: Open Heart Surgery;  Laterality: N/A;     Allergies  Allergen Reactions  . No Known Allergies       Family History  Problem Relation Age of Onset  . Adopted: Yes     Social History Mr. Lambertson reports that he has never smoked. He has never used smokeless tobacco. Mr. Hershey reports that he does not drink alcohol.   Review of Systems CONSTITUTIONAL: No weight loss, fever, chills, weakness or fatigue.  HEENT: Eyes: No visual loss, blurred vision, double vision or yellow sclerae.No hearing loss, sneezing, congestion, runny nose or sore throat.  SKIN: No rash or itching.  CARDIOVASCULAR: per hpi RESPIRATORY: No shortness of breath, cough or sputum.  GASTROINTESTINAL: No anorexia, nausea, vomiting or diarrhea. No abdominal pain or blood.  GENITOURINARY: No burning on urination, no polyuria NEUROLOGICAL: No headache, dizziness, syncope, paralysis, ataxia, numbness or tingling in the extremities. No change in bowel or bladder control.  MUSCULOSKELETAL: No muscle, back pain, joint pain or stiffness.  LYMPHATICS: No enlarged nodes. No history of splenectomy.  PSYCHIATRIC: No history of depression or anxiety.  ENDOCRINOLOGIC: No reports of sweating, cold or heat intolerance. No polyuria or polydipsia.  Marland Kitchen   Physical Examination Vitals:   05/17/17 1424  BP: (!) 182/100  Pulse: 77   Vitals:   05/17/17 1424  Weight: 180 lb (81.6 kg)  Height: 5\' 10"  (1.778 m)    Gen: resting comfortably, no acute distress HEENT: no scleral icterus, pupils equal round and reactive, no palptable cervical adenopathy,  CV: RRR, no m/r/g, no jvd Resp: Clear to auscultation  bilaterally GI: abdomen is soft, non-tender, non-distended, normal bowel sounds, no hepatosplenomegaly MSK: extremities are warm, no edema.  Skin: warm, no rash Neuro:  no focal deficits Psych: appropriate affect   Diagnostic Studies 03/2017 cath  LM lesion, 90 %stenosed.  Ost LAD to Prox LAD lesion, 95 %stenosed.  Ost 1st Diag to 1st Diag lesion, 90 %stenosed.  Ost 3rd Mrg to 3rd Mrg lesion, 95 %stenosed.  Mid Cx to Dist Cx lesion, 90 %stenosed.  RPDA lesion, 85 %stenosed.  Mid RCA lesion, 20 %stenosed.  Acute Mrg lesion, 95 %stenosed.  Findings are compatible with a significant ischemic cardiomyopathy in this patient with echo Doppler documentation of an EF of 30-35%.  Severe multivessel CAD with coronary calcification and diffuse 90-95% near ostial to mid  LAD stenoses with diffuse 90% stenosis in the first diagonal vessel; 95 and 90% diffuse stenoses in segmental obtuse marginal branches of the left circumflex coronary artery with good distal targets, and diffuse 80% to 90% PDA stenosis with 20% mid RCA stenosis and 95% proximal stenosis and a small proximal Monetta Lick of the RCA.  LVEDP 34 mm Hg.  RECOMMENDATION: Surgical consultation will be obtained for CABG revascularization surgery.       Assessment and Plan  1. CAD - s/p CABG, overall doing well - continue current meds   2. Chronic systolic HF - no current symptoms - we will increase coreg to 25mg  bid. No ACE/ARB/aldactone due to renal function - will need repeat echo after trial of medical therapy to assess possible need for icd  3. AKI on CKD - repeat labs  F/u 3 weeks       Antoine PocheJonathan F. Enjoli Tidd, M.D..

## 2017-05-19 ENCOUNTER — Encounter (HOSPITAL_COMMUNITY)
Admission: RE | Admit: 2017-05-19 | Discharge: 2017-05-19 | Disposition: A | Discharge status: Home / Self Care | Payer: BLUE CROSS/BLUE SHIELD | Source: Ambulatory Visit | Attending: Cardiology | Admitting: Cardiology

## 2017-05-19 ENCOUNTER — Encounter (HOSPITAL_COMMUNITY): Payer: Self-pay

## 2017-05-19 VITALS — BP 160/80 | HR 61 | Ht 70.0 in | Wt 176.6 lb

## 2017-05-19 DIAGNOSIS — Z79899 Other long term (current) drug therapy: Secondary | ICD-10-CM | POA: Insufficient documentation

## 2017-05-19 DIAGNOSIS — Z7902 Long term (current) use of antithrombotics/antiplatelets: Secondary | ICD-10-CM | POA: Insufficient documentation

## 2017-05-19 DIAGNOSIS — E785 Hyperlipidemia, unspecified: Secondary | ICD-10-CM | POA: Diagnosis not present

## 2017-05-19 DIAGNOSIS — I5032 Chronic diastolic (congestive) heart failure: Secondary | ICD-10-CM | POA: Insufficient documentation

## 2017-05-19 DIAGNOSIS — Z8701 Personal history of pneumonia (recurrent): Secondary | ICD-10-CM | POA: Diagnosis not present

## 2017-05-19 DIAGNOSIS — F419 Anxiety disorder, unspecified: Secondary | ICD-10-CM | POA: Insufficient documentation

## 2017-05-19 DIAGNOSIS — Z8739 Personal history of other diseases of the musculoskeletal system and connective tissue: Secondary | ICD-10-CM | POA: Insufficient documentation

## 2017-05-19 DIAGNOSIS — Z7984 Long term (current) use of oral hypoglycemic drugs: Secondary | ICD-10-CM | POA: Diagnosis not present

## 2017-05-19 DIAGNOSIS — N183 Chronic kidney disease, stage 3 (moderate): Secondary | ICD-10-CM | POA: Insufficient documentation

## 2017-05-19 DIAGNOSIS — E1122 Type 2 diabetes mellitus with diabetic chronic kidney disease: Secondary | ICD-10-CM | POA: Diagnosis not present

## 2017-05-19 DIAGNOSIS — I130 Hypertensive heart and chronic kidney disease with heart failure and stage 1 through stage 4 chronic kidney disease, or unspecified chronic kidney disease: Secondary | ICD-10-CM | POA: Diagnosis not present

## 2017-05-19 DIAGNOSIS — Z951 Presence of aortocoronary bypass graft: Secondary | ICD-10-CM | POA: Diagnosis present

## 2017-05-19 DIAGNOSIS — Z7982 Long term (current) use of aspirin: Secondary | ICD-10-CM | POA: Insufficient documentation

## 2017-05-19 NOTE — Progress Notes (Signed)
Daily Session Note  Patient Details  Name: Douglas Cannon MRN: [id number] Date of Birth: May 15, 1956 Referring Provider:     CARDIAC REHAB PHASE II ORIENTATION from 05/19/2017 in [place]  Referring Provider  Dr. Harl Bowie      Encounter Date: 05/19/2017  Check In:     Session Check In - 05/19/17 1230      Check-In   Location [facility]   Staff Present Diane Angelina Pih, MS, EP, [facility], Exercise Physiologist;Gregory Luther Parody, BS, EP, Exercise Physiologist;Kunio Cummiskey Wynetta Emery, RN, BSN   Supervising physician immediately available to respond to emergencies See telemetry face sheet for immediately available MD   Medication changes reported     No   Fall or balance concerns reported    No   Tobacco Cessation --  Never smoked   Warm-up and Cool-down Performed as group-led instruction   Resistance Training Performed Yes   VAD Patient? No     Pain Assessment   Currently in Pain? No/denies   Pain Score 0-No pain   Multiple Pain Sites No      Capillary Blood Glucose: No results found for this or any previous visit (from the past 24 hour(s)).    History  Smoking Status  . Never Smoker  Smokeless Tobacco  . Never Used    Goals Met:  Proper associated with RPD/PD & O2 Sat Exercise tolerated well Personal goals reviewed No report of cardiac concerns or symptoms Strength training completed today  Goals Unmet:  Not Applicable  Comments: Patient's orientation visit. Check out 1500.   Dr. Kate Sable is Medical Director for [facility] Cardiac and Pulmonary Rehab.

## 2017-05-19 NOTE — Progress Notes (Signed)
Cardiac/Pulmonary Rehab Medication Review by a Pharmacist  Does the patient  feel that his/her medications are working for him/her?  yes  Has the patient been experiencing any side effects to the medications prescribed?  no  Does the patient measure his/her own blood pressure or blood glucose at home?  yes   Does the patient have any problems obtaining medications due to transportation or finances?   no  Understanding of regimen: excellent Understanding of indications: excellent Potential of compliance: excellent  Questions asked to Determine Patient Understanding of Medication Regimen:  1. What is the name of the medication?  2. What is the medication used for?  3. When should it be taken?  4. How much should be taken?  5. How will you take it?  6. What side effects should you report?  Understanding Defined as: Excellent: All questions above are correct Good: Questions 1-4 are correct Fair: Questions 1-2 are correct  Poor: 1 or none of the above questions are correct   Pharmacist comments: Med list is up to date.  No adverse effects reported.  Pt does check BP and blood sugar at home.    Valrie HartHall, Mylan Lengyel A 05/19/2017 2:05 PM

## 2017-05-19 NOTE — Progress Notes (Signed)
Cardiac Individual Treatment Plan  Patient Details  Name: Douglas Cannon MRN: [id number] Date of Birth: 02/08/1956 Referring Provider:     CARDIAC REHAB PHASE II [facility]  Referring Provider  Dr. Wyline MoodBranch      Initial Encounter Date:    CARDIAC REHAB PHASE II ORIENTATION [facility] 05[facility]19/2017 [facility]  Date  05/19/17  Referring Provider  Dr. Wyline MoodBranch      Visit Diagnosis: S/P CABG x 4  Patient's Home Medications on Admission:  Current Outpatient Prescriptions:  .  amLODipine (NORVASC) 5 MG tablet, Take 1 tablet (5 mg total) by mouth daily., Disp: 30 tablet, Rfl: 1 .  aspirin EC 81 MG tablet, Take 81 mg by mouth daily., Disp: , Rfl:  .  carvedilol (COREG) 25 MG tablet, Take 1 tablet (25 mg total) by mouth 2 (two) times daily., Disp: 180 tablet, Rfl: 3 .  citalopram (CELEXA) 20 MG tablet, Take 20 mg by mouth daily., Disp: , Rfl:  .  clopidogrel (PLAVIX) 75 MG tablet, Take 1 tablet (75 mg total) by mouth daily., Disp: 30 tablet, Rfl: 1 .  Cyanocobalamin (B-12 PO), Take 1 tablet by mouth daily., Disp: , Rfl:  .  DM-Phenylephrine-Acetaminophen (ALKA-SELTZER PLS SINUS & COUGH PO), Take 1 tablet by mouth daily as needed., Disp: , Rfl:  .  furosemide (LASIX) 40 MG tablet, Take 1 tablet (40 mg total) by mouth daily., Disp: 30 tablet, Rfl: 1 .  glimepiride (AMARYL) 4 MG tablet, Take 4 mg by mouth daily with breakfast., Disp: , Rfl:  .  metFORMIN (GLUCOPHAGE) 500 MG tablet, Take by mouth 2 (two) times daily with a meal., Disp: , Rfl:  .  rosuvastatin (CRESTOR) 5 MG tablet, Take 1 tablet (5 mg total) by mouth at bedtime., Disp: , Rfl:  .  triamcinolone ointment (KENALOG) 0.1 %, Apply 1 application topically 2 (two) times daily., Disp: , Rfl:  .  Zinc 50 MG TABS, Take 1 tablet by mouth daily., Disp: , Rfl:   Past Medical History: Past Medical History:  Diagnosis Date  . Anemia   . Anxiety   . CKD (chronic kidney  disease) stage 3, GFR 30-59 ml/min   . Diastolic heart failure [facility](HCC)    Episode December 2017 in the setting of pneumonia  . Essential hypertension   . History of pneumonia   . Hyperlipemia   . Osteomyelitis (HCC)   . Type 2 diabetes mellitus (HCC)     Tobacco Use: History  Smoking Status  . Never Smoker  Smokeless Tobacco  . Never Used    Labs: Recent Review Flowsheet Data    Labs for ITP Cardiac and Pulmonary Rehab Latest Ref Rng & Units 03/23/2017 03/23/2017 03/23/2017 03/23/2017 03/23/2017   Cholestrol 0 - 200 mg/dL - - - - -   LDLCALC 0 - 99 mg/dL - - - - -   HDL >40>40 mg/dL - - - - -   Trlycerides <150 mg/dL - - - - -   Hemoglobin A1c 4.8 - 5.6 % - - - - -   PHART 7.350 - 7.450 7.388 7.381 - 7.423 -   PCO2ART 32.0 - 48.0 mmHg 39.1 38.5 - 34.8 -   HCO3 20.0 - 28.0 mmol/L 23.4 22.7 - 22.7 -   TCO2 0 - 100 mmol/L 25 24 - 24 23   ACIDBASEDEF 0.0 - 2.0 mmol/L 1.0 2.0 - 1.0 -   O2SAT % 95.0 95.0 71.6 93.0 -  Capillary Blood Glucose: Lab Results  Component Value Date   GLUCAP 170 (H) 03/27/2017   GLUCAP 127 (H) 03/27/2017   GLUCAP 147 (H) 03/26/2017   GLUCAP 67 03/26/2017   GLUCAP 170 (H) 03/26/2017     Exercise Target Goals: Date: 05/19/17  Exercise Program Goal: Individual exercise prescription set with THRR, safety & activity barriers. Participant demonstrates ability to understand and report RPE using BORG scale, to self-measure pulse accurately, and to acknowledge the importance of the exercise prescription.  Exercise Prescription Goal: Starting with aerobic activity 30 plus minutes a day, 3 days per week for initial exercise prescription. Provide home exercise prescription and guidelines that participant acknowledges understanding prior to discharge.  Activity Barriers & Risk Stratification:     Activity Barriers & Cardiac Risk Stratification - 05/19/17 1444      Activity Barriers & Cardiac Risk Stratification   Activity Barriers None   Cardiac Risk  Stratification High      6 Minute Walk:     6 Minute Walk    Row Name 05/19/17 1454         6 Minute Walk   Phase Initial     Distance 1250 feet     Distance % Change 0 %     Walk Time 6 minutes     # of Rest Breaks 0     MPH 2.27     METS 2.74     RPE 7     Perceived Dyspnea  9     VO2 Peak 11.73     Symptoms No     Resting HR 61 bpm     Resting BP 160/82     Max Ex. HR 71 bpm     Max Ex. BP 162/94     2 Minute Post BP 150/66        Oxygen Initial Assessment:   Oxygen Re-Evaluation:   Oxygen Discharge (Final Oxygen Re-Evaluation):   Initial Exercise Prescription:     Initial Exercise Prescription - 05/19/17 1400      Date of Initial Exercise RX and Referring Provider   Date 05/19/17   Referring Provider Dr. Wyline MoodBranch     Treadmill   MPH 2   Grade 0   Minutes 15   METs 2.5     Recumbant Elliptical   Level 1   RPM 33   Watts 25   Minutes 20   METs 1.8     Prescription Details   Frequency (times per week) 3   Duration Progress to 30 minutes of continuous aerobic without signs/symptoms of physical distress     Intensity   THRR 40-80% of Max Heartrate [phone]   Ratings of Perceived Exertion 11-13   Perceived Dyspnea 0-4     Progression   Progression Continue progressive overload as per policy without signs/symptoms or physical distress.     Resistance Training   Training Prescription Yes   Weight 1   Reps 10-15      Perform Capillary Blood Glucose checks as needed.  Exercise Prescription Changes:   Exercise Comments:   Exercise Goals and Review:      Exercise Goals    Row Name 05/19/17 1445             Exercise Goals   Increase Physical Activity Yes       Intervention Provide advice, education, support and counseling about physical activity/exercise needs.;Develop an individualized exercise prescription for aerobic and resistive training based on initial evaluation findings, risk  stratification, comorbidities and  participant's personal goals.       Expected Outcomes Achievement of increased cardiorespiratory fitness and enhanced flexibility, muscular endurance and strength shown through measurements of functional capacity and personal statement of participant.       Increase Strength and Stamina Yes       Intervention Provide advice, education, support and counseling about physical activity/exercise needs.;Develop an individualized exercise prescription for aerobic and resistive training based on initial evaluation findings, risk stratification, comorbidities and participant's personal goals.       Expected Outcomes Achievement of increased cardiorespiratory fitness and enhanced flexibility, muscular endurance and strength shown through measurements of functional capacity and personal statement of participant.          Exercise Goals Re-Evaluation :    Discharge Exercise Prescription (Final Exercise Prescription Changes):   Nutrition:  Target Goals: Understanding of nutrition guidelines, daily intake of sodium 1500mg , cholesterol 200mg , calories 30% from fat and 7% or less from saturated fats, daily to have 5 or more servings of fruits and vegetables.  Biometrics:     Pre Biometrics - 05/19/17 1457      Pre Biometrics   Height 5\' 10"  (1.778 m)   Weight 176 lb 9.4 oz (80.1 kg)   Waist Circumference 38.5 inches   Hip Circumference 35.5 inches   Waist to Hip Ratio 1.08 %   BMI (Calculated) 25.4   Triceps Skinfold 5 mm   % Body Fat 21.2 %   Grip Strength 53.4 kg   Flexibility 10.75 in   Single Leg Stand 4 seconds       Nutrition Therapy Plan and Nutrition Goals:   Nutrition Discharge: Rate Your Plate Scores:     Nutrition Assessments - 05/19/17 1457      MEDFICTS Scores   Pre Score 65      Nutrition Goals Re-Evaluation:   Nutrition Goals Discharge (Final Nutrition Goals Re-Evaluation):   Psychosocial: Target Goals: Acknowledge presence or absence of significant  depression and/or stress, maximize coping skills, provide positive support system. Participant is able to verbalize types and ability to use techniques and skills needed for reducing stress and depression.  Initial Review & Psychosocial Screening:     Initial Psych Review & Screening - 05/19/17 1517      Initial Review   Current issues with None Identified     Family Dynamics   Good Support System? Yes     Barriers   Psychosocial barriers to participate in program There are no identifiable barriers or psychosocial needs.     Screening Interventions   Interventions Encouraged to exercise      Quality of Life Scores:     Quality of Life - 05/19/17 1458      Quality of Life Scores   Health/Function Pre 27.2 %   Socioeconomic Pre 24.75 %   Psych/Spiritual Pre 25.71 %   Family Pre 26.4 %   GLOBAL Pre 26.23 %      PHQ-9: Recent Review Flowsheet Data    Depression screen [facility] 2/9 05/19/2017   Decreased Interest 0   Down, Depressed, Hopeless 1   PHQ - 2 Score 1   Altered sleeping 0   Tired, decreased energy 0   Change in appetite 0   Feeling bad or failure about yourself  0   Trouble concentrating 0   Moving slowly or fidgety/restless 0   Suicidal thoughts 0   PHQ-9 Score 1   Difficult doing work/chores Not difficult at all  Interpretation of Total Score  Total Score Depression Severity:  1-4 = Minimal depression, 5-9 = Mild depression, 10-14 = Moderate depression, 15-19 = Moderately severe depression, 20-27 = Severe depression   Psychosocial Evaluation and Intervention:     Psychosocial Evaluation - 05/19/17 1517      Psychosocial Evaluation & Interventions   Interventions Encouraged to exercise with the program and follow exercise prescription;Relaxation education;Stress management education   Comments Patient has no psychosocial issues identified. His PHQ-9 score was 1 and his QOL score was 26.23.    Expected Outcomes Patient will have no psychosocial issues  identified at discharge.    Continue Psychosocial Services  No Follow up required      Psychosocial Re-Evaluation:   Psychosocial Discharge (Final Psychosocial Re-Evaluation):   Vocational Rehabilitation: Provide vocational rehab assistance to qualifying candidates.   Vocational Rehab Evaluation & Intervention:     Vocational Rehab - 05/19/17 1443      Initial Vocational Rehab Evaluation & Intervention   Assessment shows need for Vocational Rehabilitation No      Education: Education Goals: Education classes will be provided on a weekly basis, covering required topics. Participant will state understanding/return demonstration of topics presented.  Learning Barriers/Preferences:     Learning Barriers/Preferences - 05/19/17 1442      Learning Barriers/Preferences   Learning Barriers None   Learning Preferences Video;Pictoral;Computer/Internet;Written Material      Education Topics: Hypertension, Hypertension Reduction -Define heart disease and high blood pressure. Discus how high blood pressure affects the body and ways to reduce high blood pressure.   Exercise and Your Heart -Discuss why it is important to exercise, the FITT principles of exercise, normal and abnormal responses to exercise, and how to exercise safely.   Angina -Discuss definition of angina, causes of angina, treatment of angina, and how to decrease risk of having angina.   Cardiac Medications -Review what the following cardiac medications are used for, how they affect the body, and side effects that may occur when taking the medications.  Medications include Aspirin, Beta blockers, calcium channel blockers, ACE Inhibitors, angiotensin receptor blockers, diuretics, digoxin, and antihyperlipidemics.   Congestive Heart Failure -Discuss the definition of CHF, how to live with CHF, the signs and symptoms of CHF, and how keep track of weight and sodium intake.   Heart Disease and Intimacy -Discus  the effect sexual activity has on the heart, how changes occur during intimacy as we age, and safety during sexual activity.   Smoking Cessation / COPD -Discuss different methods to quit smoking, the health benefits of quitting smoking, and the definition of COPD.   Nutrition I: Fats -Discuss the types of cholesterol, what cholesterol does to the heart, and how cholesterol levels can be controlled.   Nutrition II: Labels -Discuss the different components of food labels and how to read food label   Heart Parts and Heart Disease -Discuss the anatomy of the heart, the pathway of blood circulation through the heart, and these are affected by heart disease.   Stress I: Signs and Symptoms -Discuss the causes of stress, how stress may lead to anxiety and depression, and ways to limit stress.   Stress II: Relaxation -Discuss different types of relaxation techniques to limit stress.   Warning Signs of Stroke / TIA -Discuss definition of a stroke, what the signs and symptoms are of a stroke, and how to identify when someone is having stroke.   Knowledge Questionnaire Score:     Knowledge Questionnaire Score - 05/19/17 1443  Knowledge Questionnaire Score   Pre Score 22/24      Core Components/Risk Factors/Patient Goals at Admission:     Personal Goals and Risk Factors at Admission - 05/19/17 1459      Core Components/Risk Factors/Patient Goals on Admission    Weight Management Yes   Intervention Weight Management: Develop a combined nutrition and exercise program designed to reach desired caloric intake, while maintaining appropriate intake of nutrient and fiber, sodium and fats, and appropriate energy expenditure required for the weight goal.;Obesity: Provide education and appropriate resources to help participant work on and attain dietary goals.;Weight Management/Obesity: Establish reasonable short term and long term weight goals.;Weight Management: Provide education and  appropriate resources to help participant work on and attain dietary goals.   Admit Weight 176 lb 11.2 oz (80.2 kg)   Goal Weight: Short Term 171 lb 11.2 oz (77.9 kg)   Goal Weight: Long Term 166 lb 11.2 oz (75.6 kg)   Expected Outcomes Short Term: Continue to assess and modify interventions until short term weight is achieved;Long Term: Adherence to nutrition and physical activity/exercise program aimed toward attainment of established weight goal;Weight Gain: Understanding of general recommendations for a high calorie, high protein meal plan that promotes weight gain by distributing calorie intake throughout the day with the consumption for 4-5 meals, snacks, and/or supplements   Diabetes Yes   Intervention Provide education about signs/symptoms and action to take for hypo/hyperglycemia.;Provide education about proper nutrition, including hydration, and aerobic/resistive exercise prescription along with prescribed medications to achieve blood glucose in normal ranges: Fasting glucose 65-99 mg/dL   Expected Outcomes Long Term: Attainment of HbA1C < 7%.   Hypertension Yes   Intervention Provide education on lifestyle modifcations including regular physical activity/exercise, weight management, moderate sodium restriction and increased consumption of fresh fruit, vegetables, and low fat dairy, alcohol moderation, and smoking cessation.   Expected Outcomes Short Term: Continued assessment and intervention until BP is < 140/30mm HG in hypertensive participants. < 130/57mm HG in hypertensive participants with diabetes, heart failure or chronic kidney disease.;Long Term: Maintenance of blood pressure at goal levels.   Personal Goal Other Yes   Personal Goal Lose 5-10 lbs. Gain muscle tone; increaase strength.   Intervention Patient will attend CR 3 days/week and supplement with exersice at home 2 days/week.   Expected Outcomes Patient will meet his personal goals.       Core Components/Risk  Factors/Patient Goals Review:    Core Components/Risk Factors/Patient Goals at Discharge (Final Review):    ITP Comments:     ITP Comments    Row Name 05/19/17 1444           ITP Comments Douglas Cannon is a pleasant 60 year old male. He has returned to work.           Comments: Patient arrived for 1st visit/orientation/education at 1230. Patient was referred to CR by Dr. Royann Shivers due to S/P CABGx4 (Z95.1). During orientation advised patient on arrival and appointment times what to wear, what to do before, during and after exercise. Reviewed attendance and class policy. Talked about inclement weather and class consultation policy. Pt is scheduled to return Cardiac Rehab on 05/23/17 at 3:45. Pt was advised to come to class 15 minutes before class starts. Patient was also given instructions on meeting with the dietician and attending the Family Structure classes. Pt is eager to get started. Patient participated in warm up stretches followed by light resistance bands. Patient was able to complete 6 minute walk test.  He had no complaints during the test. Patient was measured for the equipment. Discussed equipment safety with patient. Took patient's pre-anthropometric measurements. Patient finished visit at 1500.

## 2017-05-23 ENCOUNTER — Encounter (HOSPITAL_COMMUNITY)
Admission: RE | Admit: 2017-05-23 | Discharge: 2017-05-23 | Disposition: A | Discharge status: Home / Self Care | Payer: BLUE CROSS/BLUE SHIELD | Source: Ambulatory Visit | Attending: Cardiology | Admitting: Cardiology

## 2017-05-23 DIAGNOSIS — Z951 Presence of aortocoronary bypass graft: Secondary | ICD-10-CM

## 2017-05-23 NOTE — Progress Notes (Signed)
Daily Session Note  Patient Details  Name: Douglas Cannon MRN: [id number] Date of Birth: Aug 20, 1956 Referring Provider:     CARDIAC REHAB PHASE II ORIENTATION from 05/19/2017 in [place]  Referring Provider  Dr. Harl Bowie      Encounter Date: 05/23/2017  Check In:     Session Check In - 05/23/17 1545      Check-In   Location [facility]   Staff Present Diane Angelina Pih, MS, EP, [facility], Exercise Physiologist;Natane Heward Wynetta Emery, RN, BSN   Supervising physician immediately available to respond to emergencies See telemetry face sheet for immediately available MD   Medication changes reported     No   Fall or balance concerns reported    No   Tobacco Cessation No Change   Warm-up and Cool-down Performed as group-led instruction   Resistance Training Performed Yes   VAD Patient? No     Pain Assessment   Currently in Pain? No/denies   Pain Score 0-No pain   Multiple Pain Sites No      Capillary Blood Glucose: No results found for this or any previous visit (from the past 24 hour(s)).    History  Smoking Status  . Never Smoker  Smokeless Tobacco  . Never Used    Goals Met:  Independence with exercise equipment Exercise tolerated well No report of cardiac concerns or symptoms Strength training completed today  Goals Unmet:  Not Applicable  Comments: Check out 1645.   Dr. Kate Sable is Medical Director for [facility] Cardiac and [facility]monary Re[facility].

## 2017-05-23 NOTE — Progress Notes (Signed)
Cardiac Individual Treatment Plan  Patient Details  Name: Douglas Cannon MRN: [id number] Date of Birth: 02/08/1956 Referring Provider:     CARDIAC REHAB PHASE II [facility]ATION [facility]  Referring Provider  Dr. Wyline MoodBranch      Initial Encounter Date:    CARDIAC REHAB PHASE II ORIENTATION from 05[facility]19/2017 [facility]  Date  05/19/17  Referring Provider  Dr. Wyline MoodBranch      Visit Diagnosis: S/P CABG x 4  Patient's Home Medications on Admission:  Current Outpatient Prescriptions:  .  amLODipine (NORVASC) 5 MG tablet, Take 1 tablet (5 mg total) by mouth daily., Disp: 30 tablet, Rfl: 1 .  aspirin EC 81 MG tablet, Take 81 mg by mouth daily., Disp: , Rfl:  .  carvedilol (COREG) 25 MG tablet, Take 1 tablet (25 mg total) by mouth 2 (two) times daily., Disp: 180 tablet, Rfl: 3 .  citalopram (CELEXA) 20 MG tablet, Take 20 mg by mouth daily., Disp: , Rfl:  .  clopidogrel (PLAVIX) 75 MG tablet, Take 1 tablet (75 mg total) by mouth daily., Disp: 30 tablet, Rfl: 1 .  Cyanocobalamin (B-12 PO), Take 1 tablet by mouth daily., Disp: , Rfl:  .  DM-Phenylephrine-Acetaminophen (ALKA-SELTZER PLS SINUS & COUGH PO), Take 1 tablet by mouth daily as needed., Disp: , Rfl:  .  furosemide (LASIX) 40 MG tablet, Take 1 tablet (40 mg total) by mouth daily., Disp: 30 tablet, Rfl: 1 .  glimepiride (AMARYL) 4 MG tablet, Take 4 mg by mouth daily with breakfast., Disp: , Rfl:  .  metFORMIN (GLUCOPHAGE) 500 MG tablet, Take by mouth 2 (two) times daily with a meal., Disp: , Rfl:  .  rosuvastatin (CRESTOR) 5 MG tablet, Take 1 tablet (5 mg total) by mouth at bedtime., Disp: , Rfl:  .  triamcinolone ointment (KENALOG) 0.1 %, Apply 1 application topically 2 (two) times daily., Disp: , Rfl:  .  Zinc 50 MG TABS, Take 1 tablet by mouth daily., Disp: , Rfl:   Past Medical History: Past Medical History:  Diagnosis Date  . Anemia   . Anxiety   . CKD (chronic kidney  disease) stage 3, GFR 30-59 ml/min   . Diastolic heart failure [facility](HCC)    Episode December 2017 in the setting of pneumonia  . Essential hypertension   . History of pneumonia   . Hyperlipemia   . Osteomyelitis (HCC)   . Type 2 diabetes mellitus (HCC)     Tobacco Use: History  Smoking Status  . Never Smoker  Smokeless Tobacco  . Never Used    Labs: Recent Review Flowsheet Data    Labs for ITP Cardiac and Pulmonary Rehab Latest Ref Rng & Units 03/23/2017 03/23/2017 03/23/2017 03/23/2017 03/23/2017   Cholestrol 0 - 200 mg/dL - - - - -   LDLCALC 0 - 99 mg/dL - - - - -   HDL >40>40 mg/dL - - - - -   Trlycerides <150 mg/dL - - - - -   Hemoglobin A1c 4.8 - 5.6 % - - - - -   PHART 7.350 - 7.450 7.388 7.381 - 7.423 -   PCO2ART 32.0 - 48.0 mmHg 39.1 38.5 - 34.8 -   HCO3 20.0 - 28.0 mmol/L 23.4 22.7 - 22.7 -   TCO2 0 - 100 mmol/L 25 24 - 24 23   ACIDBASEDEF 0.0 - 2.0 mmol/L 1.0 2.0 - 1.0 -   O2SAT % 95.0 95.0 71.6 93.0 -  Capillary Blood Glucose: Lab Results  Component Value Date   GLUCAP 170 (H) 03/27/2017   GLUCAP 127 (H) 03/27/2017   GLUCAP 147 (H) 03/26/2017   GLUCAP 67 03/26/2017   GLUCAP 170 (H) 03/26/2017     Exercise Target Goals:    Exercise Program Goal: Individual exercise prescription set with THRR, safety & activity barriers. Participant demonstrates ability to understand and report RPE using BORG scale, to self-measure pulse accurately, and to acknowledge the importance of the exercise prescription.  Exercise Prescription Goal: Starting with aerobic activity 30 plus minutes a day, 3 days per week for initial exercise prescription. Provide home exercise prescription and guidelines that participant acknowledges understanding prior to discharge.  Activity Barriers & Risk Stratification:     Activity Barriers & Cardiac Risk Stratification - 05/19/17 1444      Activity Barriers & Cardiac Risk Stratification   Activity Barriers None   Cardiac Risk Stratification High       6 Minute Walk:     6 Minute Walk    Row Name 05/19/17 1454         6 Minute Walk   Phase Initial     Distance 1250 feet     Distance % Change 0 %     Walk Time 6 minutes     # of Rest Breaks 0     MPH 2.27     METS 2.74     RPE 7     Perceived Dyspnea  9     VO2 Peak 11.73     Symptoms No     Resting HR 61 bpm     Resting BP 160/82     Max Ex. HR 71 bpm     Max Ex. BP 162/94     2 Minute Post BP 150/66        Oxygen Initial Assessment:   Oxygen Re-Evaluation:   Oxygen Discharge (Final Oxygen Re-Evaluation):   Initial Exercise Prescription:     Initial Exercise Prescription - 05/19/17 1400      Date of Initial Exercise RX and Referring Provider   Date 05/19/17   Referring Provider Dr. Wyline Mood     Treadmill   MPH 2   Grade 0   Minutes 15   METs 2.5     Recumbant Elliptical   Level 1   RPM 33   Watts 25   Minutes 20   METs 1.8     Prescription Details   Frequency (times per week) 3   Duration Progress to 30 minutes of continuous aerobic without signs/symptoms of physical distress     Intensity   THRR 40-80% of Max Heartrate 502 028 1008   Ratings of Perceived Exertion 11-13   Perceived Dyspnea 0-4     Progression   Progression Continue progressive overload as per policy without signs/symptoms or physical distress.     Resistance Training   Training Prescription Yes   Weight 1   Reps 10-15      Perform Capillary Blood Glucose checks as needed.  Exercise Prescription Changes:   Exercise Comments:   Exercise Goals and Review:      Exercise Goals    Row Name 05/19/17 1445             Exercise Goals   Increase Physical Activity Yes       Intervention Provide advice, education, support and counseling about physical activity/exercise needs.;Develop an individualized exercise prescription for aerobic and resistive training based on initial evaluation findings, risk  stratification, comorbidities and participant's personal  goals.       Expected Outcomes Achievement of increased cardiorespiratory fitness and enhanced flexibility, muscular endurance and strength shown through measurements of functional capacity and personal statement of participant.       Increase Strength and Stamina Yes       Intervention Provide advice, education, support and counseling about physical activity/exercise needs.;Develop an individualized exercise prescription for aerobic and resistive training based on initial evaluation findings, risk stratification, comorbidities and participant's personal goals.       Expected Outcomes Achievement of increased cardiorespiratory fitness and enhanced flexibility, muscular endurance and strength shown through measurements of functional capacity and personal statement of participant.          Exercise Goals Re-Evaluation :    Discharge Exercise Prescription (Final Exercise Prescription Changes):   Nutrition:  Target Goals: Understanding of nutrition guidelines, daily intake of sodium 1500mg , cholesterol 200mg , calories 30% from fat and 7% or less from saturated fats, daily to have 5 or more servings of fruits and vegetables.  Biometrics:     Pre Biometrics - 05/19/17 1457      Pre Biometrics   Height 5\' 10"  (1.778 m)   Weight 176 lb 9.4 oz (80.1 kg)   Waist Circumference 38.5 inches   Hip Circumference 35.5 inches   Waist to Hip Ratio 1.08 %   BMI (Calculated) 25.4   Triceps Skinfold 5 mm   % Body Fat 21.2 %   Grip Strength 53.4 kg   Flexibility 10.75 in   Single Leg Stand 4 seconds       Nutrition Therapy Plan and Nutrition Goals:   Nutrition Discharge: Rate Your Plate Scores:     Nutrition Assessments - 05/19/17 1457      MEDFICTS Scores   Pre Score 65      Nutrition Goals Re-Evaluation:   Nutrition Goals Discharge (Final Nutrition Goals Re-Evaluation):   Psychosocial: Target Goals: Acknowledge presence or absence of significant depression and/or stress,  maximize coping skills, provide positive support system. Participant is able to verbalize types and ability to use techniques and skills needed for reducing stress and depression.  Initial Review & Psychosocial Screening:     Initial Psych Review & Screening - 05/19/17 1517      Initial Review   Current issues with None Identified     Family Dynamics   Good Support System? Yes     Barriers   Psychosocial barriers to participate in program There are no identifiable barriers or psychosocial needs.     Screening Interventions   Interventions Encouraged to exercise      Quality of Life Scores:     Quality of Life - 05/19/17 1458      Quality of Life Scores   Health/Function Pre 27.2 %   Socioeconomic Pre 24.75 %   Psych/Spiritual Pre 25.71 %   Family Pre 26.4 %   GLOBAL Pre 26.23 %      PHQ-9: Recent Review Flowsheet Data    Depression screen [facility] 2/9 05/19/2017   Decreased Interest 0   Down, Depressed, Hopeless 1   PHQ - 2 Score 1   Altered sleeping 0   Tired, decreased energy 0   Change in appetite 0   Feeling bad or failure about yourself  0   Trouble concentrating 0   Moving slowly or fidgety/restless 0   Suicidal thoughts 0   PHQ-9 Score 1   Difficult doing work/chores Not difficult at all  Interpretation of Total Score  Total Score Depression Severity:  1-4 = Minimal depression, 5-9 = Mild depression, 10-14 = Moderate depression, 15-19 = Moderately severe depression, 20-27 = Severe depression   Psychosocial Evaluation and Intervention:     Psychosocial Evaluation - 05/19/17 1517      Psychosocial Evaluation & Interventions   Interventions Encouraged to exercise with the program and follow exercise prescription;Relaxation education;Stress management education   Comments Patient has no psychosocial issues identified. His PHQ-9 score was 1 and his QOL score was 26.23.    Expected Outcomes Patient will have no psychosocial issues identified at discharge.     Continue Psychosocial Services  No Follow up required      Psychosocial Re-Evaluation:   Psychosocial Discharge (Final Psychosocial Re-Evaluation):   Vocational Rehabilitation: Provide vocational rehab assistance to qualifying candidates.   Vocational Rehab Evaluation & Intervention:     Vocational Rehab - 05/19/17 1443      Initial Vocational Rehab Evaluation & Intervention   Assessment shows need for Vocational Rehabilitation No      Education: Education Goals: Education classes will be provided on a weekly basis, covering required topics. Participant will state understanding/return demonstration of topics presented.  Learning Barriers/Preferences:     Learning Barriers/Preferences - 05/19/17 1442      Learning Barriers/Preferences   Learning Barriers None   Learning Preferences Video;Pictoral;Computer/Internet;Written Material      Education Topics: Hypertension, Hypertension Reduction -Define heart disease and high blood pressure. Discus how high blood pressure affects the body and ways to reduce high blood pressure.   Exercise and Your Heart -Discuss why it is important to exercise, the FITT principles of exercise, normal and abnormal responses to exercise, and how to exercise safely.   Angina -Discuss definition of angina, causes of angina, treatment of angina, and how to decrease risk of having angina.   Cardiac Medications -Review what the following cardiac medications are used for, how they affect the body, and side effects that may occur when taking the medications.  Medications include Aspirin, Beta blockers, calcium channel blockers, ACE Inhibitors, angiotensin receptor blockers, diuretics, digoxin, and antihyperlipidemics.   Congestive Heart Failure -Discuss the definition of CHF, how to live with CHF, the signs and symptoms of CHF, and how keep track of weight and sodium intake.   Heart Disease and Intimacy -Discus the effect sexual activity  has on the heart, how changes occur during intimacy as we age, and safety during sexual activity.   Smoking Cessation / COPD -Discuss different methods to quit smoking, the health benefits of quitting smoking, and the definition of COPD.   Nutrition I: Fats -Discuss the types of cholesterol, what cholesterol does to the heart, and how cholesterol levels can be controlled.   Nutrition II: Labels -Discuss the different components of food labels and how to read food label   Heart Parts and Heart Disease -Discuss the anatomy of the heart, the pathway of blood circulation through the heart, and these are affected by heart disease.   Stress I: Signs and Symptoms -Discuss the causes of stress, how stress may lead to anxiety and depression, and ways to limit stress.   Stress II: Relaxation -Discuss different types of relaxation techniques to limit stress.   Warning Signs of Stroke / TIA -Discuss definition of a stroke, what the signs and symptoms are of a stroke, and how to identify when someone is having stroke.   Knowledge Questionnaire Score:     Knowledge Questionnaire Score - 05/19/17 1443  Knowledge Questionnaire Score   Pre Score 22/24      Core Components/Risk Factors/Patient Goals at Admission:     Personal Goals and Risk Factors at Admission - 05/19/17 1459      Core Components/Risk Factors/Patient Goals on Admission    Weight Management Yes   Intervention Weight Management: Develop a combined nutrition and exercise program designed to reach desired caloric intake, while maintaining appropriate intake of nutrient and fiber, sodium and fats, and appropriate energy expenditure required for the weight goal.;Obesity: Provide education and appropriate resources to help participant work on and attain dietary goals.;Weight Management/Obesity: Establish reasonable short term and long term weight goals.;Weight Management: Provide education and appropriate resources to  help participant work on and attain dietary goals.   Admit Weight 176 lb 11.2 oz (80.2 kg)   Goal Weight: Short Term 171 lb 11.2 oz (77.9 kg)   Goal Weight: Long Term 166 lb 11.2 oz (75.6 kg)   Expected Outcomes Short Term: Continue to assess and modify interventions until short term weight is achieved;Long Term: Adherence to nutrition and physical activity/exercise program aimed toward attainment of established weight goal;Weight Gain: Understanding of general recommendations for a high calorie, high protein meal plan that promotes weight gain by distributing calorie intake throughout the day with the consumption for 4-5 meals, snacks, and/or supplements   Diabetes Yes   Intervention Provide education about signs/symptoms and action to take for hypo/hyperglycemia.;Provide education about proper nutrition, including hydration, and aerobic/resistive exercise prescription along with prescribed medications to achieve blood glucose in normal ranges: Fasting glucose 65-99 mg/dL   Expected Outcomes Long Term: Attainment of HbA1C < 7%.   Hypertension Yes   Intervention Provide education on lifestyle modifcations including regular physical activity/exercise, weight management, moderate sodium restriction and increased consumption of fresh fruit, vegetables, and low fat dairy, alcohol moderation, and smoking cessation.   Expected Outcomes Short Term: Continued assessment and intervention until BP is < 140/15mm HG in hypertensive participants. < 130/4mm HG in hypertensive participants with diabetes, heart failure or chronic kidney disease.;Long Term: Maintenance of blood pressure at goal levels.   Personal Goal Other Yes   Personal Goal Lose 5-10 lbs. Gain muscle tone; increaase strength.   Intervention Patient will attend CR 3 days/week and supplement with exersice at home 2 days/week.   Expected Outcomes Patient will meet his personal goals.       Core Components/Risk Factors/Patient Goals Review:     Core Components/Risk Factors/Patient Goals at Discharge (Final Review):    ITP Comments:     ITP Comments    Row Name 05/19/17 1444 05/23/17 0745         ITP Comments Mr. Cocozza is a pleasant 61 year old male. He has returned to work.  Patient new to program. Plans to start today 05/23/17.         Comments: ITP 30 Day REVIEW Patient new to program. Plans to start today 05/23/17.

## 2017-05-25 ENCOUNTER — Encounter (HOSPITAL_COMMUNITY)
Admission: RE | Admit: 2017-05-25 | Discharge: 2017-05-25 | Disposition: A | Discharge status: Home / Self Care | Payer: BLUE CROSS/BLUE SHIELD | Source: Ambulatory Visit | Attending: Cardiology | Admitting: Cardiology

## 2017-05-25 DIAGNOSIS — Z951 Presence of aortocoronary bypass graft: Secondary | ICD-10-CM

## 2017-05-25 NOTE — Progress Notes (Signed)
Daily Session Note  Patient Details  Name: Douglas Cannon MRN: [id number] Date of Birth: 01/16/1956 Referring Provider:     CARDIAC REHAB PHASE II ORIENTATION from 05/19/2017 in [place] [facility]  Referring Provider  Dr. Branch      Encounter Date: 05/25/2017  Check In:     Session Check In - 05/25/17 1545      Check-In   Location [facility]   Staff Present Debra Johnson, RN, BSN;Gregory Cowan, BS, EP, Exercise Physiologist   Supervising physician immediately available to respond to emergencies See telemetry face sheet for immediately available MD   Medication changes reported     No   Fall or balance concerns reported    No   Tobacco Cessation No Change   Warm-up and Cool-down Performed as group-led instruction   Resistance Training Performed Yes   VAD Patient? No     Pain Assessment   Currently in Pain? No/denies   Pain Score 0-No pain   Multiple Pain Sites No      Capillary Blood Glucose: No results found for this or any previous visit (from the past 24 hour(s)).    History  Smoking Status  . Never Smoker  Smokeless Tobacco  . Never Used    Goals Met:  Independence with exercise equipment Exercise tolerated well No report of cardiac concerns or symptoms Strength training completed today  Goals Unmet:  Not Applicable  Comments: Check out 1645.   Dr. Suresh Koneswaran is Medical Director for [facility]. 

## 2017-05-27 ENCOUNTER — Encounter (HOSPITAL_COMMUNITY)
Admission: RE | Admit: 2017-05-27 | Discharge: 2017-05-27 | Disposition: A | Discharge status: Home / Self Care | Payer: BLUE CROSS/BLUE SHIELD | Source: Ambulatory Visit | Attending: Cardiology | Admitting: Cardiology

## 2017-05-27 DIAGNOSIS — Z951 Presence of aortocoronary bypass graft: Secondary | ICD-10-CM

## 2017-05-27 NOTE — Progress Notes (Signed)
Daily Session Note  Patient Details  Name: Douglas Cannon MRN: [id number] Date of Birth: 01-10-1956 Referring Provider:     CARDIAC REHAB PHASE II ORIENTATION from 05/19/2017 in [place]  Referring Provider  Dr. Harl Bowie      Encounter Date: 05/27/2017  Check In:     Session Check In - 05/27/17 1545      Check-In   Location [facility]   Staff Present Diane Angelina Pih, MS, EP, [facility], Exercise Physiologist;Shayne Diguglielmo Wynetta Emery, RN, BSN   Supervising physician immediately available to respond to emergencies See telemetry face sheet for immediately available MD   Medication changes reported     No   Fall or balance concerns reported    No   Tobacco Cessation No Change   Warm-up and Cool-down Performed as group-led instruction   Resistance Training Performed Yes   VAD Patient? No     Pain Assessment   Currently in Pain? No/denies   Pain Score 0-No pain   Multiple Pain Sites No      Capillary Blood Glucose: No results found for this or any previous visit (from the past 24 hour(s)).    History  Smoking Status  . Never Smoker  Smokeless Tobacco  . Never Used    Goals Met:  Independence with exercise equipment Exercise tolerated well No report of cardiac concerns or symptoms Strength training completed today  Goals Unmet:  Not Applicable  Comments: Check out 1645.   Dr. Kate Sable is Medical Director for [facility] and [facility] Re[facility].

## 2017-05-30 ENCOUNTER — Encounter (HOSPITAL_COMMUNITY)
Admission: RE | Admit: 2017-05-30 | Discharge: 2017-05-30 | Disposition: A | Discharge status: Home / Self Care | Payer: BLUE CROSS/BLUE SHIELD | Source: Ambulatory Visit | Attending: Cardiology | Admitting: Cardiology

## 2017-05-30 DIAGNOSIS — Z951 Presence of aortocoronary bypass graft: Secondary | ICD-10-CM

## 2017-05-30 NOTE — Progress Notes (Signed)
Daily Session Note  Patient Details  Name: Douglas Cannon MRN: [id number] Date of Birth: 05/10/1956 Referring Provider:     CARDIAC REHAB PHASE II ORIENTATION from 05/19/2017 in [place] [facility]  Referring Provider  Dr. Branch      Encounter Date: 05/30/2017  Check In:     Session Check In - 05/30/17 1543      Check-In   Location [facility]   Staff Present Diane Coad, MS, EP, [facility], Exercise Physiologist;Debra Johnson, RN, BSN;Pennye Beeghly, BS, EP, Exercise Physiologist   Supervising physician immediately available to respond to emergencies See telemetry face sheet for immediately available MD   Medication changes reported     No   Fall or balance concerns reported    No   Warm-up and Cool-down Performed as group-led instruction   Resistance Training Performed Yes   VAD Patient? No     Pain Assessment   Currently in Pain? No/denies   Pain Score 0-No pain   Multiple Pain Sites No      Capillary Blood Glucose: No results found for this or any previous visit (from the past 24 hour(s)).    History  Smoking Status  . Never Smoker  Smokeless Tobacco  . Never Used    Goals Met:  Independence with exercise equipment Exercise tolerated well No report of cardiac concerns or symptoms Strength training completed today  Goals Unmet:  Not Applicable  Comments: Check out 445   Dr. Suresh Koneswaran is Medical Director for  Cardiac and Pulmonary Rehab. 

## 2017-06-01 ENCOUNTER — Encounter (HOSPITAL_COMMUNITY)
Admission: RE | Admit: 2017-06-01 | Discharge: 2017-06-01 | Disposition: A | Discharge status: Home / Self Care | Payer: BLUE CROSS/BLUE SHIELD | Source: Ambulatory Visit | Attending: Cardiology | Admitting: Cardiology

## 2017-06-01 DIAGNOSIS — Z951 Presence of aortocoronary bypass graft: Secondary | ICD-10-CM | POA: Diagnosis not present

## 2017-06-01 NOTE — Progress Notes (Signed)
Daily Session Note  Patient Details  Name: Douglas Cannon MRN: [id number] Date of Birth: 05-Mar-1956 Referring Provider:     CARDIAC REHAB PHASE II ORIENTATION from 05/19/2017 in [place]  Referring Provider  Dr. Harl Bowie      Encounter Date: 06/01/2017  Check In:     Session Check In - 06/01/17 1545      Check-In   Location [facility]   Staff Present Diane Angelina Pih, MS, EP, [facility], Exercise Physiologist;Medina Degraffenreid Wynetta Emery, RN, BSN;Gregory Cowan, BS, EP, Exercise Physiologist   Supervising physician immediately available to respond to emergencies See telemetry face sheet for immediately available MD   Medication changes reported     No   Fall or balance concerns reported    No   Tobacco Cessation No Change   Warm-up and Cool-down Performed as group-led instruction   Resistance Training Performed Yes   VAD Patient? No     Pain Assessment   Currently in Pain? No/denies   Pain Score 0-No pain   Multiple Pain Sites No      Capillary Blood Glucose: No results found for this or any previous visit (from the past 24 hour(s)).    History  Smoking Status  . Never Smoker  Smokeless Tobacco  . Never Used    Goals Met:  Independence with exercise equipment Exercise tolerated well No report of cardiac concerns or symptoms Strength training completed today  Goals Unmet:  Not Applicable  Comments: Check out 1645.   Dr. Kate Sable is Medical Director for [facility] Card[facility] and Pulmonary Rehab.

## 2017-06-03 ENCOUNTER — Ambulatory Visit (INDEPENDENT_AMBULATORY_CARE_PROVIDER_SITE_OTHER): Payer: BLUE CROSS/BLUE SHIELD | Admitting: Cardiology

## 2017-06-03 ENCOUNTER — Encounter: Payer: Self-pay | Admitting: Cardiology

## 2017-06-03 ENCOUNTER — Encounter (HOSPITAL_COMMUNITY)
Admission: RE | Admit: 2017-06-03 | Discharge: 2017-06-03 | Disposition: A | Discharge status: Home / Self Care | Payer: BLUE CROSS/BLUE SHIELD | Source: Ambulatory Visit | Attending: Cardiology | Admitting: Cardiology

## 2017-06-03 VITALS — BP 154/80 | HR 62 | Ht 70.0 in | Wt 174.0 lb

## 2017-06-03 DIAGNOSIS — I10 Essential (primary) hypertension: Secondary | ICD-10-CM

## 2017-06-03 DIAGNOSIS — N183 Chronic kidney disease, stage 3 unspecified: Secondary | ICD-10-CM

## 2017-06-03 DIAGNOSIS — Z951 Presence of aortocoronary bypass graft: Secondary | ICD-10-CM | POA: Diagnosis not present

## 2017-06-03 DIAGNOSIS — I5022 Chronic systolic (congestive) heart failure: Secondary | ICD-10-CM

## 2017-06-03 DIAGNOSIS — I2510 Atherosclerotic heart disease of native coronary artery without angina pectoris: Secondary | ICD-10-CM

## 2017-06-03 MED ORDER — AMLODIPINE BESYLATE 10 MG PO TABS: 10.0000 mg | ORAL_TABLET | Freq: Every day | ORAL | 1 refills | Status: DC

## 2017-06-03 MED ORDER — CLOPIDOGREL BISULFATE 75 MG PO TABS: 75.0000 mg | ORAL_TABLET | Freq: Every day | ORAL | 1 refills | Status: DC

## 2017-06-03 NOTE — Progress Notes (Signed)
Clinical Summary [name] is a 61 y.o.male seen today for follow up of the following medical problems.    1. CAD - admit with NSTEMI, 03/2017 cath showed multivessel CAD and referred for CABG - echo 02/2017 LVEF 30-35%, abnormal diastolic function, mild AI, mild MR - 03/22/17 4 vessel CABG (LIMA-LAD, SVG-Diag, SVG to OM1, SVG-OM2) - started on plavix due to use of cryo veins during surgery and presenting with NSTEMI   - participating in cardiac rehab - no recent SOB/DOE. No LE edema. No chest pain.   2. Chronic systolic HF/ICM - 02/2017 LVEF 40-98%30-35% - no recent LE edema, SOB/DOE - L>R leg swelling for many years.   - last visit we increased coreg to 25mg  bid. Has not been on ACE/ARB/ARNI due to poor renal function - tolerating dose increase.  - home weights 169-174 lbs. Limiting sodium intake.    3.  CKD 3 - not on ACE-I, lisionpril previously stopped.  - has not gone to see renal yet, awaiting results of f/u labs    4. HTN - home cuff off by 15, adjusted 130s-140s/80s.  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   Past Medical History:  Diagnosis Date  . Anemia   . Anxiety   . CKD (chronic kidney disease) stage 3, GFR 30-59 ml/min   . Diastolic heart failure [facility](HCC)    Episode December 2017 in the setting of pneumonia  . Essential hypertension   . History of pneumonia   . Hyperlipemia   . Osteomyelitis (HCC)   . Type 2 diabetes mellitus (HCC)      Allergies  Allergen Reactions  . No Known Allergies      Current Outpatient Prescriptions  Medication Sig Dispense Refill  . amLODipine (NORVASC) 5 MG tablet Take 1 tablet (5 mg total) by mouth daily. 30 tablet 1  . aspirin EC 81 MG tablet Take 81 mg by mouth daily.    . carvedilol (COREG) 25 MG tablet Take 1 tablet (25 mg total) by mouth 2 (two) times daily. 180 tablet 3  . citalopram (CELEXA) 20 MG tablet Take 20 mg by mouth daily.    . clopidogrel (PLAVIX) 75 MG tablet Take 1  tablet (75 mg total) by mouth daily. 30 tablet 1  . Cyanocobalamin (B-12 PO) Take 1 tablet by mouth daily.    Marland Kitchen. DM-Phenylephrine-Acetaminophen (ALKA-SELTZER PLS SINUS & COUGH PO) Take 1 tablet by mouth daily as needed.    . furosemide (LASIX) 40 MG tablet Take 1 tablet (40 mg total) by mouth daily. 30 tablet 1  . glimepiride (AMARYL) 4 MG tablet Take 4 mg by mouth daily with breakfast.    . metFORMIN (GLUCOPHAGE) 500 MG tablet Take by mouth 2 (two) times daily with a meal.    . rosuvastatin (CRESTOR) 5 MG tablet Take 1 tablet (5 mg total) by mouth at bedtime.    . triamcinolone ointment (KENALOG) 0.1 % Apply 1 application topically 2 (two) times daily.    . Zinc 50 MG TABS Take 1 tablet by mouth daily.     No current facility-administered medications for this visit.      Past Surgical History:  Procedure Laterality Date  . AMPUTATION Right 12/24/2015   Procedure: PARTIAL THIRD RAY AMPUTATION OF THE RIGHT FOOT (AMPUTATION OF THE THIRD TOE AND A PORTION OF THE THIRD METATARSAL);  Surgeon: Ferman HammingBenjamin McKinney, DPM;  Location: [facility] ORS;  Service: Podiatry;  Laterality: Right;  .  CORONARY ARTERY BYPASS GRAFT N/A 03/22/2017   Procedure: CORONARY ARTERY BYPASS GRAFTING (CABG)x4 using left internal mammary artery, cryo shapenous vein and left greater saphenous vein harvested endoscopically.  LIMA-LAD SVG-DIAD CRYOVEIN -OM2 CRYOVEIN -OM1 EVH LEFT THIGH, RIGHT GSV EXPLORED -TOO SMALL TO USE;  Surgeon: Kerin Perna, MD;  Location: [facility] OR;  Service: Open Heart Surgery;  Laterality: N/A;  . LEFT HEART CATH AND CORONARY ANGIOGRAPHY N/A 03/15/2017   Procedure: Left Heart Cath and Coronary Angiography;  Surgeon: Lennette Bihari, MD;  Location: [facility] INVASIVE CV LAB;  Service: Cardiovascular;  Laterality: N/A;  . RIGHT HEART CATH N/A 03/18/2017   Procedure: Right Heart Cath;  Surgeon: Laurey Morale, MD;  Location: [facility] INVASIVE CV LAB;  Service: Cardiovascular;  Laterality: N/A;  . TEE WITHOUT CARDIOVERSION N/A 03/22/2017     Procedure: TRANSESOPHAGEAL ECHOCARDIOGRAM (TEE);  Surgeon: Donata Clay, Theron Arista, MD;  Location: [facility] OR;  Service: Open Heart Surgery;  Laterality: N/A;     Allergies  Allergen Reactions  . No Known Allergies       Family History  Problem Relation Age of Onset  . Adopted: Yes     Social History [name] reports that he has never smoked. He has never used smokeless tobacco. [name] reports that he does not drink alcohol.   Review of Systems CONSTITUTIONAL: No weight loss, fever, chills, weakness or fatigue.  HEENT: Eyes: No visual loss, blurred vision, double vision or yellow sclerae.No hearing loss, sneezing, congestion, runny nose or sore throat.  SKIN: No rash or itching.  CARDIOVASCULAR: per hpi RESPIRATORY: No shortness of breath, cough or sputum.  GASTROINTESTINAL: No anorexia, nausea, vomiting or diarrhea. No abdominal pain or blood.  GENITOURINARY: No burning on urination, no polyuria NEUROLOGICAL: No headache, dizziness, syncope, paralysis, ataxia, numbness or tingling in the extremities. No change in bowel or bladder control.  MUSCULOSKELETAL: No muscle, back pain, joint pain or stiffness.  LYMPHATICS: No enlarged nodes. No history of splenectomy.  PSYCHIATRIC: No history of depression or anxiety.  ENDOCRINOLOGIC: No reports of sweating, cold or heat intolerance. No polyuria or polydipsia.  Marland Kitchen   Physical Examination Vitals:   06/03/17 0914  BP: (!) 154/80  Pulse: 62   Vitals:   06/03/17 0914  Weight: 174 lb (78.9 kg)  Height: 5\' 10"  (1.778 m)    Gen: resting comfortably, no acute distress HEENT: no scleral icterus, pupils equal round and reactive, no palptable cervical adenopathy,  CV: RRR, no m/r/g, no jvd Resp: Clear to auscultation bilaterally GI: abdomen is soft, non-tender, non-distended, normal bowel sounds, no hepatosplenomegaly MSK: extremities are warm, no edema.  Skin: warm, no rash Neuro:  no focal deficits Psych: appropriate  affect   Diagnostic Studies 03/2017 cath  LM lesion, 90 %stenosed.  Ost LAD to Prox LAD lesion, 95 %stenosed.  Ost 1st Diag to 1st Diag lesion, 90 %stenosed.  Ost 3rd Mrg to 3rd Mrg lesion, 95 %stenosed.  Mid Cx to Dist Cx lesion, 90 %stenosed.  RPDA lesion, 85 %stenosed.  Mid RCA lesion, 20 %stenosed.  Acute Mrg lesion, 95 %stenosed.  Findings are compatible with a significant ischemic cardiomyopathy in this patient with echo Doppler documentation of an EF of 30-35%.  Severe multivessel CAD with coronary calcification and diffuse 90-95% near ostial to mid LAD stenoses with diffuse 90% stenosis in the first diagonal vessel; 95 and 90% diffuse stenoses in segmental obtuse marginal branches of the left circumflex coronary artery with good distal targets, and diffuse 80% to 90%  PDA stenosis with 20% mid RCA stenosis and 95% proximal stenosis and a small proximal Plumer Mittelstaedt of the RCA.  LVEDP 34 mm Hg.  RECOMMENDATION: Surgical consultation will be obtained for CABG revascularization surgery.    Assessment and Plan  1. CAD - s/p CABG, no recent symptoms - he will continue current meds   2. Chronic systolic HF -  No ACE/ARB/aldactone due to renal function - will need repeat echo after trial of medical therapy to assess possible need for icd - continue current meds  3. CKD 3 - has labs coming up with pcp - pending results may consider low dose ACE-I  4. HTN - above goal, increase norvasc to 10mg  daily   F/u 40month      Antoine Poche, M.D

## 2017-06-03 NOTE — Progress Notes (Signed)
Daily Session Note  Patient Details  Name: Douglas Cannon MRN: [id number] Date of Birth: 1956/06/05 Referring Provider:     CARDIAC REHAB PHASE II ORIENTATION from 05/19/2017 in [place]  Referring Provider  Dr. Harl Bowie      Encounter Date: 06/03/2017  Check In:     Session Check In - 06/03/17 1545      Check-In   Location [facility]   Staff Present Aundra Dubin, RN, BSN;Gregory Luther Parody, BS, EP, Exercise Physiologist   Supervising physician immediately available to respond to emergencies See telemetry face sheet for immediately available MD   Medication changes reported     Yes   Comments Cardiologist increased Amlopidine from 5 mg to 10 mg daily today.   Fall or balance concerns reported    No   Tobacco Cessation No Change   Warm-up and Cool-down Performed as group-led instruction   Resistance Training Performed Yes   VAD Patient? No     Pain Assessment   Currently in Pain? No/denies   Pain Score 0-No pain   Multiple Pain Sites No      Capillary Blood Glucose: No results found for this or any previous visit (from the past 24 hour(s)).    History  Smoking Status  . Never Smoker  Smokeless Tobacco  . Never Used    Goals Met:  Independence with exercise equipment Exercise tolerated well No report of cardiac concerns or symptoms Strength training completed today  Goals Unmet:  Not Applicable  Comments: Check out 1645.   Dr. Kate Sable is Medical Director for [facility] Cardiac and Pulmonary Rehab.

## 2017-06-03 NOTE — Patient Instructions (Signed)
Your physician recommends that you schedule a follow-up appointment in: [facility]  Your physician has recommended you make the following change in your medication:   INCREASE NORVASC (AMLODIPINE) 10 MG DAILY  WE HAVE REFILLED PLAVIX   Thank you for choosing [facility]!!

## 2017-06-06 ENCOUNTER — Encounter (HOSPITAL_COMMUNITY)
Admission: RE | Admit: 2017-06-06 | Discharge: 2017-06-06 | Disposition: A | Discharge status: Home / Self Care | Payer: BLUE CROSS/BLUE SHIELD | Source: Ambulatory Visit | Attending: Cardiology | Admitting: Cardiology

## 2017-06-06 DIAGNOSIS — Z951 Presence of aortocoronary bypass graft: Secondary | ICD-10-CM | POA: Diagnosis not present

## 2017-06-08 ENCOUNTER — Encounter (HOSPITAL_COMMUNITY)
Admission: RE | Admit: 2017-06-08 | Discharge: 2017-06-08 | Disposition: A | Discharge status: Home / Self Care | Payer: BLUE CROSS/BLUE SHIELD | Source: Ambulatory Visit | Attending: Cardiology | Admitting: Cardiology

## 2017-06-08 DIAGNOSIS — Z951 Presence of aortocoronary bypass graft: Secondary | ICD-10-CM | POA: Diagnosis not present

## 2017-06-08 NOTE — Progress Notes (Signed)
Daily Session Note  Patient Details  Name: Douglas Cannon MRN: [id number] Date of Birth: [birth year] Referring Provider:     CARDIAC REHAB PHASE II ORIENTATION from 05/19/2017 in [place]  Referring Provider  Dr. Harl Bowie      Encounter Date: 06/08/2017  Check In:     Session Check In - 06/08/17 1556      Check-In   Location [facility]   Staff Present Diane Angelina Pih, MS, EP, [facility], Exercise Physiologist;Nara Paternoster Luther Parody, BS, EP, Exercise Physiologist   Supervising physician immediately available to respond to emergencies See telemetry face sheet for immediately available MD   Medication changes reported     No   Fall or balance concerns reported    No   Warm-up and Cool-down Performed as group-led instruction   Resistance Training Performed Yes   VAD Patient? No     Pain Assessment   Currently in Pain? No/denies   Pain Score 0-No pain   Multiple Pain Sites No      Capillary Blood Glucose: No results found for this or any previous visit (from the past 24 hour(s)).    History  Smoking Status  . Never Smoker  Smokeless Tobacco  . Never Used    Goals Met:  Independence with exercise equipment Exercise tolerated well No report of cardiac concerns or symptoms Strength training completed today  Goals Unmet:  Not Applicable  Comments: Check out 445   Dr. Kate Sable is Medical Director for [place] and Pulmonary Rehab.

## 2017-06-10 ENCOUNTER — Encounter (HOSPITAL_COMMUNITY)
Admission: RE | Admit: 2017-06-10 | Discharge: 2017-06-10 | Disposition: A | Discharge status: Home / Self Care | Payer: BLUE CROSS/BLUE SHIELD | Source: Ambulatory Visit | Attending: Cardiology | Admitting: Cardiology

## 2017-06-10 DIAGNOSIS — Z951 Presence of aortocoronary bypass graft: Secondary | ICD-10-CM | POA: Diagnosis not present

## 2017-06-10 NOTE — Progress Notes (Signed)
Daily Session Note  Patient Details  Name: Douglas Cannon MRN: [id number] Date of Birth: 1956-06-01 Referring Provider:     CARDIAC REHAB PHASE II ORIENTATION from 05/19/2017 in [place]  Referring Provider  Dr. Harl Bowie      Encounter Date: 06/10/2017  Check In:     Session Check In - 06/10/17 1545      Check-In   Location [facility]   Staff Present Layth Cerezo Angelina Pih, MS, EP, [facility], Exercise Physiologist;Gregory Luther Parody, BS, EP, Exercise Physiologist   Supervising physician immediately available to respond to emergencies See telemetry face sheet for immediately available MD   Medication changes reported     No   Fall or balance concerns reported    No   Tobacco Cessation No Change   Warm-up and Cool-down Performed as group-led instruction   Resistance Training Performed Yes   VAD Patient? No     Pain Assessment   Currently in Pain? No/denies   Pain Score 0-No pain   Multiple Pain Sites No      Capillary Blood Glucose: No results found for this or any previous visit (from the past 24 hour(s)).    History  Smoking Status  . Never Smoker  Smokeless Tobacco  . Never Used    Goals Met:  Independence with exercise equipment Exercise tolerated well No report of cardiac concerns or symptoms Strength training completed today  Goals Unmet:  Not Applicable  Comments: Check out: [place]   Dr. Kate Sable is Medical Director for [place] and Pulmonary Rehab.

## 2017-06-13 ENCOUNTER — Encounter (HOSPITAL_COMMUNITY)
Admission: RE | Admit: 2017-06-13 | Discharge: 2017-06-13 | Disposition: A | Discharge status: Home / Self Care | Payer: BLUE CROSS/BLUE SHIELD | Source: Ambulatory Visit | Attending: Cardiology | Admitting: Cardiology

## 2017-06-13 DIAGNOSIS — Z951 Presence of aortocoronary bypass graft: Secondary | ICD-10-CM

## 2017-06-13 NOTE — Progress Notes (Signed)
Daily Session Note  Patient Details  Name: Douglas Cannon MRN: [id number] Date of Birth: Oct 27, 1956 Referring Provider:     CARDIAC REHAB PHASE II ORIENTATION from 05/19/2017 in [place]  Referring Provider  Dr. Harl Bowie      Encounter Date: 06/13/2017  Check In:     Session Check In - 06/13/17 1545      Check-In   Location [facility]   Staff Present Aundra Dubin, RN, BSN;Diane Coad, MS, EP, [facility], Exercise Physiologist   Supervising physician immediately available to respond to emergencies See telemetry face sheet for immediately available MD   Medication changes reported     No   Fall or balance concerns reported    No   Tobacco Cessation No Change   Warm-up and Cool-down Performed as group-led instruction   Resistance Training Performed Yes   VAD Patient? No     Pain Assessment   Currently in Pain? No/denies   Pain Score 0-No pain   Multiple Pain Sites No      Capillary Blood Glucose: No results found for this or any previous visit (from the past 24 hour(s)).    History  Smoking Status  . Never Smoker  Smokeless Tobacco  . Never Used    Goals Met:  Independence with exercise equipment Exercise tolerated well No report of cardiac concerns or symptoms Strength training completed today  Goals Unmet:  Not Applicable  Comments: Check out 1645.   Dr. Kate Sable is Medical Director for [facility] Cardiac and Pulmonary Rehab.

## 2017-06-15 ENCOUNTER — Encounter (HOSPITAL_COMMUNITY)
Admission: RE | Admit: 2017-06-15 | Discharge: 2017-06-15 | Disposition: A | Discharge status: Home / Self Care | Payer: BLUE CROSS/BLUE SHIELD | Source: Ambulatory Visit | Attending: Cardiology | Admitting: Cardiology

## 2017-06-15 DIAGNOSIS — Z8701 Personal history of pneumonia (recurrent): Secondary | ICD-10-CM | POA: Diagnosis not present

## 2017-06-15 DIAGNOSIS — Z7982 Long term (current) use of aspirin: Secondary | ICD-10-CM | POA: Diagnosis not present

## 2017-06-15 DIAGNOSIS — Z951 Presence of aortocoronary bypass graft: Secondary | ICD-10-CM | POA: Diagnosis present

## 2017-06-15 DIAGNOSIS — E785 Hyperlipidemia, unspecified: Secondary | ICD-10-CM | POA: Insufficient documentation

## 2017-06-15 DIAGNOSIS — Z79899 Other long term (current) drug therapy: Secondary | ICD-10-CM | POA: Insufficient documentation

## 2017-06-15 DIAGNOSIS — I130 Hypertensive heart and chronic kidney disease with heart failure and stage 1 through stage 4 chronic kidney disease, or unspecified chronic kidney disease: Secondary | ICD-10-CM | POA: Insufficient documentation

## 2017-06-15 DIAGNOSIS — Z8739 Personal history of other diseases of the musculoskeletal system and connective tissue: Secondary | ICD-10-CM | POA: Diagnosis not present

## 2017-06-15 DIAGNOSIS — Z7984 Long term (current) use of oral hypoglycemic drugs: Secondary | ICD-10-CM | POA: Insufficient documentation

## 2017-06-15 DIAGNOSIS — N183 Chronic kidney disease, stage 3 (moderate): Secondary | ICD-10-CM | POA: Diagnosis not present

## 2017-06-15 DIAGNOSIS — F419 Anxiety disorder, unspecified: Secondary | ICD-10-CM | POA: Insufficient documentation

## 2017-06-15 DIAGNOSIS — E1122 Type 2 diabetes mellitus with diabetic chronic kidney disease: Secondary | ICD-10-CM | POA: Insufficient documentation

## 2017-06-15 DIAGNOSIS — I5032 Chronic diastolic (congestive) heart failure: Secondary | ICD-10-CM | POA: Insufficient documentation

## 2017-06-15 DIAGNOSIS — Z7902 Long term (current) use of antithrombotics/antiplatelets: Secondary | ICD-10-CM | POA: Diagnosis not present

## 2017-06-15 NOTE — Progress Notes (Signed)
Daily Session Note  Patient Details  Name: Douglas Cannon MRN: [id number] Date of Birth: 10-04-1956 Referring Provider:     CARDIAC REHAB PHASE II ORIENTATION from 05/19/2017 in [place]  Referring Provider  Dr. Harl Bowie      Encounter Date: 06/15/2017  Check In:     Session Check In - 06/15/17 1545      Check-In   Location [facility]   Staff Present Diane Angelina Pih, MS, EP, [facility], Exercise Physiologist;Jireh Vinas Wynetta Emery, RN, BSN   Supervising physician immediately available to respond to emergencies See telemetry face sheet for immediately available MD   Medication changes reported     No   Fall or balance concerns reported    No   Tobacco Cessation No Change   Warm-up and Cool-down Performed as group-led instruction   Resistance Training Performed Yes   VAD Patient? No     Pain Assessment   Currently in Pain? No/denies   Pain Score 0-No pain   Multiple Pain Sites No      Capillary Blood Glucose: No results found for this or any previous visit (from the past 24 hour(s)).    History  Smoking Status  . Never Smoker  Smokeless Tobacco  . Never Used    Goals Met:  Independence with exercise equipment Exercise tolerated well No report of cardiac concerns or symptoms Strength training completed today  Goals Unmet:  Not Applicable  Comments: Check out 1645.   Dr. Kate Sable is Medical Director for [facility] Cardiac and Pulmonary Rehab.

## 2017-06-17 ENCOUNTER — Encounter (HOSPITAL_COMMUNITY)
Admission: RE | Admit: 2017-06-17 | Discharge: 2017-06-17 | Disposition: A | Discharge status: Home / Self Care | Payer: BLUE CROSS/BLUE SHIELD | Source: Ambulatory Visit | Attending: Cardiology | Admitting: Cardiology

## 2017-06-17 DIAGNOSIS — Z951 Presence of aortocoronary bypass graft: Secondary | ICD-10-CM | POA: Diagnosis not present

## 2017-06-17 NOTE — Progress Notes (Signed)
Daily Session Note  Patient Details  Name: [name] MRN: [id number] Date of Birth: 1956-06-10 Referring Provider:     CARDIAC REHAB PHASE II ORIENTATION from 05/19/2017 in [place]  Referring Provider  Dr. Harl Bowie      Encounter Date: 06/17/2017  Check In:     Session Check In - 06/17/17 1545      Check-In   Location [facility]   Staff Present Diane Angelina Pih, MS, EP, [facility], Exercise Physiologist;Merlin Golden Wynetta Emery, RN, BSN   Supervising physician immediately available to respond to emergencies See telemetry face sheet for immediately available MD   Medication changes reported     No   Fall or balance concerns reported    No   Tobacco Cessation No Change   Warm-up and Cool-down Performed as group-led instruction   Resistance Training Performed Yes   VAD Patient? No     Pain Assessment   Currently in Pain? No/denies   Pain Score 0-No pain   Multiple Pain Sites No      Capillary Blood Glucose: No results found for this or any previous visit (from the past 24 hour(s)).    History  Smoking Status  . Never Smoker  Smokeless Tobacco  . Never Used    Goals Met:  Independence with exercise equipment Exercise tolerated well No report of cardiac concerns or symptoms Strength training completed today  Goals Unmet:  Not Applicable  Comments: Check out 1645.   Dr. Kate Sable is Medical Director for [facility] Cardiac and Pulmonary Rehab.

## 2017-06-20 ENCOUNTER — Encounter (HOSPITAL_COMMUNITY)
Admission: RE | Admit: 2017-06-20 | Discharge: 2017-06-20 | Disposition: A | Discharge status: Home / Self Care | Payer: BLUE CROSS/BLUE SHIELD | Source: Ambulatory Visit | Attending: Cardiology | Admitting: Cardiology

## 2017-06-20 DIAGNOSIS — Z951 Presence of aortocoronary bypass graft: Secondary | ICD-10-CM | POA: Diagnosis not present

## 2017-06-20 NOTE — Progress Notes (Signed)
Cardiac Individual Treatment Plan  Patient Details  Name: Douglas Cannon MRN: [id number] Date of Birth: 05-19-1956 Referring Provider:     CARDIAC REHAB PHASE II ORIENTATION from 05/19/2017 in [facility] CARDIAC REHABILITATION  Referring Provider  Dr. Wyline Mood      Initial Encounter Date:    CARDIAC REHAB PHASE II ORIENTATION from 05/19/2017 in [place] CARDIAC REHABILITATION  Date  05/19/17  Referring Provider  Dr. Wyline Mood      Visit Diagnosis: S/P CABG x 4  Patient's Home Medications on Admission:  Current Outpatient Prescriptions:  .  amLODipine (NORVASC) 10 MG tablet, Take 1 tablet (10 mg total) by mouth daily., Disp: 90 tablet, Rfl: 1 .  aspirin EC 81 MG tablet, Take 81 mg by mouth daily., Disp: , Rfl:  .  carvedilol (COREG) 25 MG tablet, Take 1 tablet (25 mg total) by mouth 2 (two) times daily., Disp: 180 tablet, Rfl: 3 .  citalopram (CELEXA) 20 MG tablet, Take 20 mg by mouth daily., Disp: , Rfl:  .  clopidogrel (PLAVIX) 75 MG tablet, Take 1 tablet (75 mg total) by mouth daily., Disp: 90 tablet, Rfl: 1 .  Cyanocobalamin (B-12 PO), Take 1 tablet by mouth daily., Disp: , Rfl:  .  DM-Phenylephrine-Acetaminophen (ALKA-SELTZER PLS SINUS & COUGH PO), Take 1 tablet by mouth daily as needed., Disp: , Rfl:  .  furosemide (LASIX) 40 MG tablet, Take 1 tablet (40 mg total) by mouth daily., Disp: 30 tablet, Rfl: 1 .  glimepiride (AMARYL) 4 MG tablet, Take 4 mg by mouth daily with breakfast., Disp: , Rfl:  .  metFORMIN (GLUCOPHAGE) 500 MG tablet, Take by mouth 2 (two) times daily with a meal., Disp: , Rfl:  .  rosuvastatin (CRESTOR) 5 MG tablet, Take 1 tablet (5 mg total) by mouth at bedtime., Disp: , Rfl:  .  triamcinolone ointment (KENALOG) 0.1 %, Apply 1 application topically 2 (two) times daily., Disp: , Rfl:  .  Zinc 50 MG TABS, Take 1 tablet by mouth daily., Disp: , Rfl:   Past Medical History: Past Medical History:  Diagnosis Date  . Anemia   . Anxiety   . CKD (chronic kidney  disease) stage 3, GFR 30-59 ml/min   . Diastolic heart failure [facility])    Episode December 2017 in the setting of pneumonia  . Essential hypertension   . History of pneumonia   . Hyperlipemia   . Osteomyelitis (HCC)   . Type 2 diabetes mellitus (HCC)     Tobacco Use: History  Smoking Status  . Never Smoker  Smokeless Tobacco  . Never Used    Labs: Recent Review Flowsheet Data    Labs for ITP Cardiac and Pulmonary Rehab Latest Ref Rng & Units 03/23/2017 03/23/2017 03/23/2017 03/23/2017 03/23/2017   Cholestrol 0 - 200 mg/dL - - - - -   LDLCALC 0 - 99 mg/dL - - - - -   HDL >40 mg/dL - - - - -   Trlycerides <150 mg/dL - - - - -   Hemoglobin A1c 4.8 - 5.6 % - - - - -   PHART 7.350 - 7.450 7.388 7.381 - 7.423 -   PCO2ART 32.0 - 48.0 mmHg 39.1 38.5 - 34.8 -   HCO3 20.0 - 28.0 mmol/L 23.4 22.7 - 22.7 -   TCO2 0 - 100 mmol/L 25 24 - 24 23   ACIDBASEDEF 0.0 - 2.0 mmol/L 1.0 2.0 - 1.0 -   O2SAT % 95.0 95.0 71.6 93.0 -  Capillary Blood Glucose: Lab Results  Component Value Date   GLUCAP 170 (H) 03/27/2017   GLUCAP 127 (H) 03/27/2017   GLUCAP 147 (H) 03/26/2017   GLUCAP 67 03/26/2017   GLUCAP 170 (H) 03/26/2017     Exercise Target Goals:    Exercise Program Goal: Individual exercise prescription set with THRR, safety & activity barriers. Participant demonstrates ability to understand and report RPE using BORG scale, to self-measure pulse accurately, and to acknowledge the importance of the exercise prescription.  Exercise Prescription Goal: Starting with aerobic activity 30 plus minutes a day, 3 days per week for initial exercise prescription. Provide home exercise prescription and guidelines that participant acknowledges understanding prior to discharge.  Activity Barriers & Risk Stratification:     Activity Barriers & Cardiac Risk Stratification - 05/19/17 1444      Activity Barriers & Cardiac Risk Stratification   Activity Barriers None   Cardiac Risk Stratification High       6 Minute Walk:     6 Minute Walk    Row Name 05/19/17 1454         6 Minute Walk   Phase Initial     Distance 1250 feet     Distance % Change 0 %     Walk Time 6 minutes     # of Rest Breaks 0     MPH 2.27     METS 2.74     RPE 7     Perceived Dyspnea  9     VO2 Peak 11.73     Symptoms No     Resting HR 61 bpm     Resting BP 160/82     Max Ex. HR 71 bpm     Max Ex. BP 162/94     2 Minute Post BP 150/66        Oxygen Initial Assessment:   Oxygen Re-Evaluation:   Oxygen Discharge (Final Oxygen Re-Evaluation):   Initial Exercise Prescription:     Initial Exercise Prescription - 05/19/17 1400      Date of Initial Exercise RX and Referring Provider   Date 05/19/17   Referring Provider Dr. Wyline Mood     Treadmill   MPH 2   Grade 0   Minutes 15   METs 2.5     Recumbant Elliptical   Level 1   RPM 33   Watts 25   Minutes 20   METs 1.8     Prescription Details   Frequency (times per week) 3   Duration Progress to 30 minutes of continuous aerobic without signs/symptoms of physical distress     Intensity   THRR 40-80% of Max Heartrate [phone]   Ratings of Perceived Exertion 11-13   Perceived Dyspnea 0-4     Progression   Progression Continue progressive overload as per policy without signs/symptoms or physical distress.     Resistance Training   Training Prescription Yes   Weight 1   Reps 10-15      Perform Capillary Blood Glucose checks as needed.  Exercise Prescription Changes:      Exercise Prescription Changes    Row Name 06/06/17 1400             Response to Exercise   Blood Pressure (Admit) 140/70       Blood Pressure (Exercise) 120/60       Blood Pressure (Exit) 150/80       Heart Rate (Admit) 61 bpm       Heart Rate (Exercise)  94 bpm       Heart Rate (Exit) 70 bpm       Rating of Perceived Exertion (Exercise) 8       Duration Progress to 30 minutes of  aerobic without signs/symptoms of physical distress        Intensity THRR unchanged         Progression   Progression Continue to progress workloads to maintain intensity without signs/symptoms of physical distress.         Resistance Training   Training Prescription Yes       Weight 2       Reps 10-15         Treadmill   MPH 2.5       Grade 0       Minutes 15       METs 2.9         Recumbant Elliptical   Level 1       RPM 68       Watts 90       Minutes 20       METs 5.1         Home Exercise Plan   Plans to continue exercise at Home (comment)       Frequency Add 2 additional days to program exercise sessions.          Exercise Comments:      Exercise Comments    Row Name 06/06/17 1454           Exercise Comments Patient is doing well in CR.           Exercise Goals and Review:      Exercise Goals    Row Name 05/19/17 1445             Exercise Goals   Increase Physical Activity Yes       Intervention Provide advice, education, support and counseling about physical activity/exercise needs.;Develop an individualized exercise prescription for aerobic and resistive training based on initial evaluation findings, risk stratification, comorbidities and participant's personal goals.       Expected Outcomes Achievement of increased cardiorespiratory fitness and enhanced flexibility, muscular endurance and strength shown through measurements of functional capacity and personal statement of participant.       Increase Strength and Stamina Yes       Intervention Provide advice, education, support and counseling about physical activity/exercise needs.;Develop an individualized exercise prescription for aerobic and resistive training based on initial evaluation findings, risk stratification, comorbidities and participant's personal goals.       Expected Outcomes Achievement of increased cardiorespiratory fitness and enhanced flexibility, muscular endurance and strength shown through measurements of functional capacity and  personal statement of participant.          Exercise Goals Re-Evaluation :     Exercise Goals Re-Evaluation    Row Name 06/20/17 1442             Exercise Goal Re-Evaluation   Exercise Goals Review Increase Physical Activity;Increase Strenth and Stamina       Comments Patient has completed 13 sessions. He is progressing well in the program with increased speed on treadmill and increased weights and resistance bands. He says he is feeling stronger. Will continue to monitor.        Expected Outcomes Patient will complete the program with continued icreased strength, stamina, and activity.           Discharge Exercise Prescription (Final Exercise Prescription Changes):  Exercise Prescription Changes - 06/06/17 1400      Response to Exercise   Blood Pressure (Admit) 140/70   Blood Pressure (Exercise) 120/60   Blood Pressure (Exit) 150/80   Heart Rate (Admit) 61 bpm   Heart Rate (Exercise) 94 bpm   Heart Rate (Exit) 70 bpm   Rating of Perceived Exertion (Exercise) 8   Duration Progress to 30 minutes of  aerobic without signs/symptoms of physical distress   Intensity THRR unchanged     Progression   Progression Continue to progress workloads to maintain intensity without signs/symptoms of physical distress.     Resistance Training   Training Prescription Yes   Weight 2   Reps 10-15     Treadmill   MPH 2.5   Grade 0   Minutes 15   METs 2.9     Recumbant Elliptical   Level 1   RPM 68   Watts 90   Minutes 20   METs 5.1     Home Exercise Plan   Plans to continue exercise at Home (comment)   Frequency Add 2 additional days to program exercise sessions.      Nutrition:  Target Goals: Understanding of nutrition guidelines, daily intake of sodium 1500mg , cholesterol 200mg , calories 30% from fat and 7% or less from saturated fats, daily to have 5 or more servings of fruits and vegetables.  Biometrics:     Pre Biometrics - 05/19/17 1457      Pre  Biometrics   Height 5\' 10"  (1.778 m)   Weight 176 lb 9.4 oz (80.1 kg)   Waist Circumference 38.5 inches   Hip Circumference 35.5 inches   Waist to Hip Ratio 1.08 %   BMI (Calculated) 25.4   Triceps Skinfold 5 mm   % Body Fat 21.2 %   Grip Strength 53.4 kg   Flexibility 10.75 in   Single Leg Stand 4 seconds       Nutrition Therapy Plan and Nutrition Goals:   Nutrition Discharge: Rate Your Plate Scores:     Nutrition Assessments - 05/19/17 1457      MEDFICTS Scores   Pre Score 65      Nutrition Goals Re-Evaluation:   Nutrition Goals Discharge (Final Nutrition Goals Re-Evaluation):   Psychosocial: Target Goals: Acknowledge presence or absence of significant depression and/or stress, maximize coping skills, provide positive support system. Participant is able to verbalize types and ability to use techniques and skills needed for reducing stress and depression.  Initial Review & Psychosocial Screening:     Initial Psych Review & Screening - 05/19/17 1517      Initial Review   Current issues with None Identified     Family Dynamics   Good Support System? Yes     Barriers   Psychosocial barriers to participate in program There are no identifiable barriers or psychosocial needs.     Screening Interventions   Interventions Encouraged to exercise      Quality of Life Scores:     Quality of Life - 05/19/17 1458      Quality of Life Scores   Health/Function Pre 27.2 %   Socioeconomic Pre 24.75 %   Psych/Spiritual Pre 25.71 %   Family Pre 26.4 %   GLOBAL Pre 26.23 %      PHQ-9: Recent Review Flowsheet Data    Depression screen [facility] 2/9 05/19/2017   Decreased Interest 0   Down, Depressed, Hopeless 1   PHQ - 2 Score 1   Altered sleeping  0   Tired, decreased energy 0   Change in appetite 0   Feeling bad or failure about yourself  0   Trouble concentrating 0   Moving slowly or fidgety/restless 0   Suicidal thoughts 0   PHQ-9 Score 1   Difficult doing  work/chores Not difficult at all     Interpretation of Total Score  Total Score Depression Severity:  1-4 = Minimal depression, 5-9 = Mild depression, 10-14 = Moderate depression, 15-19 = Moderately severe depression, 20-27 = Severe depression   Psychosocial Evaluation and Intervention:     Psychosocial Evaluation - 05/19/17 1517      Psychosocial Evaluation & Interventions   Interventions Encouraged to exercise with the program and follow exercise prescription;Relaxation education;Stress management education   Comments Patient has no psychosocial issues identified. His PHQ-9 score was 1 and his QOL score was 26.23.    Expected Outcomes Patient will have no psychosocial issues identified at discharge.    Continue Psychosocial Services  No Follow up required      Psychosocial Re-Evaluation:     Psychosocial Re-Evaluation    Row Name 06/20/17 1443             Psychosocial Re-Evaluation   Current issues with None Identified       Expected Outcomes Patient will have no psychosocial barriers identified at discharge.        Interventions Encouraged to attend Cardiac Rehabilitation for the exercise;Stress management education;Relaxation education       Continue Psychosocial Services  No Follow up required          Psychosocial Discharge (Final Psychosocial Re-Evaluation):     Psychosocial Re-Evaluation - 06/20/17 1443      Psychosocial Re-Evaluation   Current issues with None Identified   Expected Outcomes Patient will have no psychosocial barriers identified at discharge.    Interventions Encouraged to attend Cardiac Rehabilitation for the exercise;Stress management education;Relaxation education   Continue Psychosocial Services  No Follow up required      Vocational Rehabilitation: Provide vocational rehab assistance to qualifying candidates.   Vocational Rehab Evaluation & Intervention:     Vocational Rehab - 05/19/17 1443      Initial Vocational Rehab  Evaluation & Intervention   Assessment shows need for Vocational Rehabilitation No      Education: Education Goals: Education classes will be provided on a weekly basis, covering required topics. Participant will state understanding/return demonstration of topics presented.  Learning Barriers/Preferences:     Learning Barriers/Preferences - 05/19/17 1442      Learning Barriers/Preferences   Learning Barriers None   Learning Preferences Video;Pictoral;Computer/Internet;Written Material      Education Topics: Hypertension, Hypertension Reduction -Define heart disease and high blood pressure. Discus how high blood pressure affects the body and ways to reduce high blood pressure.   CARDIAC REHAB PHASE II EXERCISE from 06/15/2017 in [place] [facility]  Date  06/15/17  Educator  DC  Instruction Review Code  2- meets goals/outcomes      Exercise and Your Heart -Discuss why it is important to exercise, the FITT principles of exercise, normal and abnormal responses to exercise, and how to exercise safely.   Angina -Discuss definition of angina, causes of angina, treatment of angina, and how to decrease risk of having angina.   Cardiac Medications -Review what the following cardiac medications are used for, how they affect the body, and side effects that may occur when taking the medications.  Medications include Aspirin, Beta blockers, calcium  channel blockers, ACE Inhibitors, angiotensin receptor blockers, diuretics, digoxin, and antihyperlipidemics.   Congestive Heart Failure -Discuss the definition of CHF, how to live with CHF, the signs and symptoms of CHF, and how keep track of weight and sodium intake.   Heart Disease and Intimacy -Discus the effect sexual activity has on the heart, how changes occur during intimacy as we age, and safety during sexual activity.   Smoking Cessation / COPD -Discuss different methods to quit smoking, the health benefits of  quitting smoking, and the definition of COPD.   Nutrition I: Fats -Discuss the types of cholesterol, what cholesterol does to the heart, and how cholesterol levels can be controlled.   Nutrition II: Labels -Discuss the different components of food labels and how to read food label   Heart Parts and Heart Disease -Discuss the anatomy of the heart, the pathway of blood circulation through the heart, and these are affected by heart disease.   Stress I: Signs and Symptoms -Discuss the causes of stress, how stress may lead to anxiety and depression, and ways to limit stress.   CARDIAC REHAB PHASE II EXERCISE from 06/15/2017 in [place] [facility]  Date  05/25/17  Educator  GC  Instruction Review Code  2- meets goals/outcomes      Stress II: Relaxation -Discuss different types of relaxation techniques to limit stress.   CARDIAC REHAB PHASE II EXERCISE from 06/15/2017 in [place] [facility]  Date  06/01/17  Educator  GC  Instruction Review Code  2- meets goals/outcomes      Warning Signs of Stroke / TIA -Discuss definition of a stroke, what the signs and symptoms are of a stroke, and how to identify when someone is having stroke.   CARDIAC REHAB PHASE II EXERCISE from 06/15/2017 in [place] [facility]  Date  06/08/17  Educator  GC  Instruction Review Code  2- meets goals/outcomes      Knowledge Questionnaire Score:     Knowledge Questionnaire Score - 05/19/17 1443      Knowledge Questionnaire Score   Pre Score 22/24      Core Components/Risk Factors/Patient Goals at Admission:     Personal Goals and Risk Factors at Admission - 05/19/17 1459      Core Components/Risk Factors/Patient Goals on Admission    Weight Management Yes   Intervention Weight Management: Develop a combined nutrition and exercise program designed to reach desired caloric intake, while maintaining appropriate intake of nutrient and fiber, sodium and fats, and  appropriate energy expenditure required for the weight goal.;Obesity: Provide education and appropriate resources to help participant work on and attain dietary goals.;Weight Management/Obesity: Establish reasonable short term and long term weight goals.;Weight Management: Provide education and appropriate resources to help participant work on and attain dietary goals.   Admit Weight 176 lb 11.2 oz (80.2 kg)   Goal Weight: Short Term 171 lb 11.2 oz (77.9 kg)   Goal Weight: Long Term 166 lb 11.2 oz (75.6 kg)   Expected Outcomes Short Term: Continue to assess and modify interventions until short term weight is achieved;Long Term: Adherence to nutrition and physical activity/exercise program aimed toward attainment of established weight goal;Weight Gain: Understanding of general recommendations for a high calorie, high protein meal plan that promotes weight gain by distributing calorie intake throughout the day with the consumption for 4-5 meals, snacks, and/or supplements   Diabetes Yes   Intervention Provide education about signs/symptoms and action to take for hypo/hyperglycemia.;Provide education about proper nutrition,  including hydration, and aerobic/resistive exercise prescription along with prescribed medications to achieve blood glucose in normal ranges: Fasting glucose 65-99 mg/dL   Expected Outcomes Long Term: Attainment of HbA1C < 7%.   Hypertension Yes   Intervention Provide education on lifestyle modifcations including regular physical activity/exercise, weight management, moderate sodium restriction and increased consumption of fresh fruit, vegetables, and low fat dairy, alcohol moderation, and smoking cessation.   Expected Outcomes Short Term: Continued assessment and intervention until BP is < 140/6090mm HG in hypertensive participants. < 130/1380mm HG in hypertensive participants with diabetes, heart failure or chronic kidney disease.;Long Term: Maintenance of blood pressure at goal levels.    Personal Goal Other Yes   Personal Goal Lose 5-10 lbs. Gain muscle tone; increaase strength.   Intervention Patient will attend CR 3 days/week and supplement with exersice at home 2 days/week.   Expected Outcomes Patient will meet his personal goals.       Core Components/Risk Factors/Patient Goals Review:      Goals and Risk Factor Review    Row Name 06/20/17 1438             Core Components/Risk Factors/Patient Goals Review   Personal Goals Review Weight Management/Obesity;Diabetes  Lose 5-10 lbs; gain muscle tone and strength.       Review Patient has completed 13 sessions gaining 6.6 lbs. He is doing well in the program. His last A1C was last week was 6.0. He is working full time without difficulty. He says he feels stronger and better. Will continue to monitor.        Expected Outcomes Patient will complete the program and meet his personal goals.           Core Components/Risk Factors/Patient Goals at Discharge (Final Review):      Goals and Risk Factor Review - 06/20/17 1438      Core Components/Risk Factors/Patient Goals Review   Personal Goals Review Weight Management/Obesity;Diabetes  Lose 5-10 lbs; gain muscle tone and strength.   Review Patient has completed 13 sessions gaining 6.6 lbs. He is doing well in the program. His last A1C was last week was 6.0. He is working full time without difficulty. He says he feels stronger and better. Will continue to monitor.    Expected Outcomes Patient will complete the program and meet his personal goals.       ITP Comments:     ITP Comments    Row Name 05/19/17 1444 05/23/17 0745         ITP Comments [name] is a pleasant 61 year old male. He has returned to work.  Patient new to program. Plans to start today 05/23/17.         Comments: ITP 30 Day REVIEW Patient doing well in the program. Will continue to monitor for progress.

## 2017-06-20 NOTE — Progress Notes (Signed)
Daily Session Note  Patient Details  Name: Douglas Cannon MRN: [id number] Date of Birth: [birth year] Referring Provider:     CARDIAC REHAB PHASE II ORIENTATION from 05/19/2017 in [place]  Referring Provider  Dr. Harl Bowie      Encounter Date: 06/20/2017  Check In:     Session Check In - 06/20/17 1545      Check-In   Location [facility]   Staff Present Diane Angelina Pih, MS, EP, [facility], Exercise Physiologist;Leveon Pelzer Wynetta Emery, RN, BSN   Supervising physician immediately available to respond to emergencies See telemetry face sheet for immediately available MD   Medication changes reported     No   Fall or balance concerns reported    No   Tobacco Cessation No Change   Warm-up and Cool-down Performed as group-led instruction   Resistance Training Performed Yes   VAD Patient? No     Pain Assessment   Currently in Pain? No/denies   Pain Score 0-No pain   Multiple Pain Sites No      Capillary Blood Glucose: No results found for this or any previous visit (from the past 24 hour(s)).    History  Smoking Status  . Never Smoker  Smokeless Tobacco  . Never Used    Goals Met:  Independence with exercise equipment Exercise tolerated well No report of cardiac concerns or symptoms Strength training completed today  Goals Unmet:  Not Applicable  Comments: Check out 1645.   Dr. Kate Sable is Medical Director for [facility] Cardiac and Pulmonary Rehab.

## 2017-06-22 ENCOUNTER — Encounter (HOSPITAL_COMMUNITY)
Admission: RE | Admit: 2017-06-22 | Discharge: 2017-06-22 | Disposition: A | Discharge status: Home / Self Care | Payer: BLUE CROSS/BLUE SHIELD | Source: Ambulatory Visit | Attending: Cardiology | Admitting: Cardiology

## 2017-06-22 DIAGNOSIS — Z951 Presence of aortocoronary bypass graft: Secondary | ICD-10-CM | POA: Diagnosis not present

## 2017-06-23 NOTE — Progress Notes (Signed)
Daily Session Note  Patient Details  Name: Douglas Cannon MRN: [id number] Date of Birth: [birth year] Referring Provider:     CARDIAC REHAB PHASE II ORIENTATION from 05/19/2017 in [place]  Referring Provider  Dr. Harl Bowie      Encounter Date: 06/22/2017  Check In:     Session Check In - 06/22/17 1545      Check-In   Location [facility]   Staff Present Willowdean Luhmann Angelina Pih, MS, EP, [facility], Exercise Physiologist;Debra Wynetta Emery, RN, BSN   Supervising physician immediately available to respond to emergencies See telemetry face sheet for immediately available MD   Medication changes reported     No   Fall or balance concerns reported    No   Tobacco Cessation No Change   Warm-up and Cool-down Performed as group-led instruction   Resistance Training Performed Yes   VAD Patient? No     Pain Assessment   Currently in Pain? No/denies   Multiple Pain Sites No      Capillary Blood Glucose: No results found for this or any previous visit (from the past 24 hour(s)).    History  Smoking Status  . Never Smoker  Smokeless Tobacco  . Never Used    Goals Met:  Independence with exercise equipment Exercise tolerated well No report of cardiac concerns or symptoms Strength training completed today  Goals Unmet:  Not Applicable  Comments: Check out: 1645. Had to enter day after class to to [facility] outage.    Dr. Kate Sable is Medical Director for [facility] Cardiac and Pulmonary Rehab.

## 2017-06-24 ENCOUNTER — Encounter (HOSPITAL_COMMUNITY)
Admission: RE | Admit: 2017-06-24 | Discharge: 2017-06-24 | Disposition: A | Discharge status: Home / Self Care | Payer: BLUE CROSS/BLUE SHIELD | Source: Ambulatory Visit | Attending: Cardiology | Admitting: Cardiology

## 2017-06-24 DIAGNOSIS — Z951 Presence of aortocoronary bypass graft: Secondary | ICD-10-CM | POA: Diagnosis not present

## 2017-06-24 NOTE — Progress Notes (Signed)
Daily Session Note  Patient Details  Name: Douglas Cannon MRN: [id number] Date of Birth: 1956/08/31 Referring Provider:     CARDIAC REHAB PHASE II ORIENTATION from 05/19/2017 in [place]  Referring Provider  Dr. Harl Bowie      Encounter Date: 06/24/2017  Check In:     Session Check In - 06/24/17 1545      Check-In   Location [facility]   Staff Present Diane Angelina Pih, MS, EP, [facility], Exercise Physiologist;Elani Delph Wynetta Emery, RN, BSN   Supervising physician immediately available to respond to emergencies See telemetry face sheet for immediately available MD   Medication changes reported     No   Fall or balance concerns reported    No   Tobacco Cessation No Change   Warm-up and Cool-down Performed as group-led instruction   Resistance Training Performed Yes   VAD Patient? No     Pain Assessment   Currently in Pain? No/denies   Pain Score 0-No pain   Multiple Pain Sites No      Capillary Blood Glucose: No results found for this or any previous visit (from the past 24 hour(s)).    History  Smoking Status  . Never Smoker  Smokeless Tobacco  . Never Used    Goals Met:  Independence with exercise equipment Exercise tolerated well No report of cardiac concerns or symptoms Strength training completed today  Goals Unmet:  Not Applicable  Comments: Check out 1645.   Dr. Kate Sable is Medical Director for [facility] Cardiac and Pulmonary Rehab.

## 2017-06-27 ENCOUNTER — Encounter (HOSPITAL_COMMUNITY)
Admission: RE | Admit: 2017-06-27 | Discharge: 2017-06-27 | Disposition: A | Discharge status: Home / Self Care | Payer: BLUE CROSS/BLUE SHIELD | Source: Ambulatory Visit | Attending: Cardiology | Admitting: Cardiology

## 2017-06-27 DIAGNOSIS — Z951 Presence of aortocoronary bypass graft: Secondary | ICD-10-CM

## 2017-06-27 NOTE — Progress Notes (Signed)
Daily Session Note  Patient Details  Name: Douglas Cannon MRN: [id number] Date of Birth: [birth year] Referring Provider:     CARDIAC REHAB PHASE II ORIENTATION from 05/19/2017 in [place]  Referring Provider  Dr. Harl Bowie      Encounter Date: 06/27/2017  Check In:     Session Check In - 06/27/17 1545      Check-In   Location [facility]   Staff Present Diane Angelina Pih, MS, EP, [facility], Exercise Physiologist;Evalyse Stroope Wynetta Emery, RN, BSN   Supervising physician immediately available to respond to emergencies See telemetry face sheet for immediately available MD   Medication changes reported     No   Fall or balance concerns reported    No   Tobacco Cessation No Change   Warm-up and Cool-down Performed as group-led instruction   Resistance Training Performed Yes   VAD Patient? No     Pain Assessment   Currently in Pain? No/denies   Pain Score 0-No pain   Multiple Pain Sites No      Capillary Blood Glucose: No results found for this or any previous visit (from the past 24 hour(s)).    History  Smoking Status  . Never Smoker  Smokeless Tobacco  . Never Used    Goals Met:  Independence with exercise equipment Exercise tolerated well No report of cardiac concerns or symptoms Strength training completed today  Goals Unmet:  Not Applicable  Comments: Check out 1645.   Dr. Kate Sable is Medical Director for [facility] Cardiac and Pulmonary Rehab.

## 2017-06-29 ENCOUNTER — Encounter (HOSPITAL_COMMUNITY)
Admission: RE | Admit: 2017-06-29 | Discharge: 2017-06-29 | Disposition: A | Discharge status: Home / Self Care | Payer: BLUE CROSS/BLUE SHIELD | Source: Ambulatory Visit | Attending: Cardiology | Admitting: Cardiology

## 2017-06-29 DIAGNOSIS — Z951 Presence of aortocoronary bypass graft: Secondary | ICD-10-CM

## 2017-06-29 NOTE — Progress Notes (Signed)
Daily Session Note  Patient Details  Name: Douglas Cannon MRN: [id number] Date of Birth: 01-Dec-1955 Referring Provider:     CARDIAC REHAB PHASE II ORIENTATION from 05/19/2017 in [place]  Referring Provider  Dr. Harl Bowie      Encounter Date: 06/29/2017  Check In:     Session Check In - 06/29/17 1545      Check-In   Location [facility]   Staff Present Diane Angelina Pih, MS, EP, [facility], Exercise Physiologist;Aury Scollard Wynetta Emery, RN, BSN   Supervising physician immediately available to respond to emergencies See telemetry face sheet for immediately available MD   Medication changes reported     No   Fall or balance concerns reported    No   Tobacco Cessation No Change   Warm-up and Cool-down Performed as group-led instruction   Resistance Training Performed Yes   VAD Patient? No     Pain Assessment   Currently in Pain? No/denies   Pain Score 0-No pain   Multiple Pain Sites No      Capillary Blood Glucose: No results found for this or any previous visit (from the past 24 hour(s)).    History  Smoking Status  . Never Smoker  Smokeless Tobacco  . Never Used    Goals Met:  Independence with exercise equipment Exercise tolerated well No report of cardiac concerns or symptoms Strength training completed today  Goals Unmet:  Not Applicable  Comments: Check out 1645.   Dr. Kate Sable is Medical Director for [facility] Cardiac and Pulmonary Rehab.

## 2017-07-01 ENCOUNTER — Encounter (HOSPITAL_COMMUNITY)
Admission: RE | Admit: 2017-07-01 | Discharge: 2017-07-01 | Disposition: A | Discharge status: Home / Self Care | Payer: BLUE CROSS/BLUE SHIELD | Source: Ambulatory Visit | Attending: Cardiology | Admitting: Cardiology

## 2017-07-01 DIAGNOSIS — Z951 Presence of aortocoronary bypass graft: Secondary | ICD-10-CM | POA: Diagnosis not present

## 2017-07-01 NOTE — Progress Notes (Signed)
Daily Session Note  Patient Details  Name: Douglas Cannon MRN: [id number] Date of Birth: June 02, 1956 Referring Provider:     CARDIAC REHAB PHASE II ORIENTATION from 05/19/2017 in [place]  Referring Provider  Dr. Harl Bowie      Encounter Date: 07/01/2017  Check In:     Session Check In - 07/01/17 1545      Check-In   Location [facility]   Staff Present Aundra Dubin, RN, BSN;Gregory Luther Parody, BS, EP, Exercise Physiologist   Supervising physician immediately available to respond to emergencies See telemetry face sheet for immediately available MD   Medication changes reported     No   Fall or balance concerns reported    No   Warm-up and Cool-down Performed as group-led instruction   Resistance Training Performed Yes   VAD Patient? No     Pain Assessment   Currently in Pain? No/denies   Pain Score 0-No pain   Multiple Pain Sites No      Capillary Blood Glucose: No results found for this or any previous visit (from the past 24 hour(s)).    History  Smoking Status  . Never Smoker  Smokeless Tobacco  . Never Used    Goals Met:  Independence with exercise equipment Exercise tolerated well No report of cardiac concerns or symptoms Strength training completed today  Goals Unmet:  Not Applicable  Comments: Check out 1645.   Dr. Kate Sable is Medical Director for [facility] Cardiac and Pulmonary Rehab.

## 2017-07-04 ENCOUNTER — Encounter (HOSPITAL_COMMUNITY)
Admission: RE | Admit: 2017-07-04 | Discharge: 2017-07-04 | Disposition: A | Discharge status: Home / Self Care | Payer: BLUE CROSS/BLUE SHIELD | Source: Ambulatory Visit | Attending: Cardiology | Admitting: Cardiology

## 2017-07-04 DIAGNOSIS — Z951 Presence of aortocoronary bypass graft: Secondary | ICD-10-CM | POA: Diagnosis not present

## 2017-07-04 NOTE — Progress Notes (Signed)
Daily Session Note  Patient Details  Name: KYZEN HORN MRN: [id number] Date of Birth: [birth year] Referring Provider:     CARDIAC REHAB PHASE II ORIENTATION from 05/19/2017 in [place]  Referring Provider  Dr. Harl Bowie      Encounter Date: 07/04/2017  Check In:     Session Check In - 07/04/17 1545      Check-In   Location [facility]   Staff Present Aundra Dubin, RN, BSN;Gregory Luther Parody, BS, EP, Exercise Physiologist   Supervising physician immediately available to respond to emergencies See telemetry face sheet for immediately available MD   Medication changes reported     No   Fall or balance concerns reported    No   Warm-up and Cool-down Performed as group-led instruction   Resistance Training Performed Yes   VAD Patient? No     Pain Assessment   Currently in Pain? No/denies   Pain Score 0-No pain   Multiple Pain Sites No      Capillary Blood Glucose: No results found for this or any previous visit (from the past 24 hour(s)).    History  Smoking Status  . Never Smoker  Smokeless Tobacco  . Never Used    Goals Met:  Independence with exercise equipment Exercise tolerated well No report of cardiac concerns or symptoms Strength training completed today  Goals Unmet:  Not Applicable  Comments: Check out 1645.   Dr. Kate Sable is Medical Director for [facility] Cardiac and Pulmonary Rehab.

## 2017-07-06 ENCOUNTER — Encounter (HOSPITAL_COMMUNITY)
Admission: RE | Admit: 2017-07-06 | Discharge: 2017-07-06 | Disposition: A | Discharge status: Home / Self Care | Payer: BLUE CROSS/BLUE SHIELD | Source: Ambulatory Visit | Attending: Cardiology | Admitting: Cardiology

## 2017-07-06 DIAGNOSIS — Z951 Presence of aortocoronary bypass graft: Secondary | ICD-10-CM | POA: Diagnosis not present

## 2017-07-06 NOTE — Progress Notes (Signed)
Daily Session Note  Patient Details  Name: Douglas Cannon MRN: [id number] Date of Birth: 10-22-1956 Referring Provider:     CARDIAC REHAB PHASE II ORIENTATION from 05/19/2017 in [place]  Referring Provider  Dr. Harl Bowie      Encounter Date: 07/06/2017  Check In:     Session Check In - 07/06/17 1545      Check-In   Location [facility]   Staff Present Aundra Dubin, RN, BSN;Gregory Luther Parody, BS, EP, Exercise Physiologist   Supervising physician immediately available to respond to emergencies See telemetry face sheet for immediately available MD   Medication changes reported     No   Fall or balance concerns reported    No   Warm-up and Cool-down Performed as group-led instruction   Resistance Training Performed Yes   VAD Patient? No     Pain Assessment   Currently in Pain? No/denies   Pain Score 0-No pain   Multiple Pain Sites No      Capillary Blood Glucose: No results found for this or any previous visit (from the past 24 hour(s)).    History  Smoking Status  . Never Smoker  Smokeless Tobacco  . Never Used    Goals Met:  Independence with exercise equipment Exercise tolerated well No report of cardiac concerns or symptoms Strength training completed today  Goals Unmet:  Not Applicable  Comments: Check out 1645.   Dr. Kate Sable is Medical Director for [facility] Cardiac and Pulmonary Rehab.

## 2017-07-07 NOTE — Addendum Note (Signed)
Addendum  created 07/07/17 1414 by Evelisse Szalkowski, [place], MD   Sign clinical note    

## 2017-07-08 ENCOUNTER — Encounter (HOSPITAL_COMMUNITY)
Admission: RE | Admit: 2017-07-08 | Discharge: 2017-07-08 | Disposition: A | Discharge status: Home / Self Care | Payer: BLUE CROSS/BLUE SHIELD | Source: Ambulatory Visit | Attending: Cardiology | Admitting: Cardiology

## 2017-07-08 DIAGNOSIS — Z951 Presence of aortocoronary bypass graft: Secondary | ICD-10-CM | POA: Diagnosis not present

## 2017-07-08 NOTE — Progress Notes (Signed)
Daily Session Note  Patient Details  Name: Douglas Cannon MRN: [id number] Date of Birth: 02-20-1956 Referring Provider:     CARDIAC REHAB PHASE II ORIENTATION from 05/19/2017 in [place]  Referring Provider  Dr. Harl Bowie      Encounter Date: 07/08/2017  Check In:     Session Check In - 07/08/17 1545      Check-In   Location [facility]   Staff Present Aundra Dubin, RN, BSN;Gregory Luther Parody, BS, EP, Exercise Physiologist   Supervising physician immediately available to respond to emergencies See telemetry face sheet for immediately available MD   Medication changes reported     No   Fall or balance concerns reported    No   Warm-up and Cool-down Performed as group-led instruction   Resistance Training Performed Yes   VAD Patient? No     Pain Assessment   Currently in Pain? No/denies   Pain Score 0-No pain   Multiple Pain Sites No      Capillary Blood Glucose: No results found for this or any previous visit (from the past 24 hour(s)).    History  Smoking Status  . Never Smoker  Smokeless Tobacco  . Never Used    Goals Met:  Independence with exercise equipment Exercise tolerated well No report of cardiac concerns or symptoms Strength training completed today  Goals Unmet:  Not Applicable  Comments: Check out 1645.   Dr. Kate Sable is Medical Director for [facility] Cardiac and Pulmonary Rehab.

## 2017-07-11 ENCOUNTER — Ambulatory Visit: Payer: BLUE CROSS/BLUE SHIELD | Admitting: Cardiology

## 2017-07-11 ENCOUNTER — Encounter (HOSPITAL_COMMUNITY)
Admission: RE | Admit: 2017-07-11 | Discharge: 2017-07-11 | Disposition: A | Discharge status: Home / Self Care | Payer: BLUE CROSS/BLUE SHIELD | Source: Ambulatory Visit | Attending: Cardiology | Admitting: Cardiology

## 2017-07-11 DIAGNOSIS — Z951 Presence of aortocoronary bypass graft: Secondary | ICD-10-CM

## 2017-07-11 NOTE — Progress Notes (Signed)
Daily Session Note  Patient Details  Name: Douglas Cannon MRN: [id number] Date of Birth: 10-18-1956 Referring Provider:     CARDIAC REHAB PHASE II ORIENTATION from 05/19/2017 in [place]  Referring Provider  Dr. Harl Bowie      Encounter Date: 07/11/2017  Check In:     Session Check In - 07/11/17 1545      Check-In   Location [facility]   Staff Present Diane Angelina Pih, MS, EP, [facility], Exercise Physiologist;Ilisha Blust Wynetta Emery, RN, BSN   Supervising physician immediately available to respond to emergencies See telemetry face sheet for immediately available MD   Medication changes reported     No   Fall or balance concerns reported    No   Warm-up and Cool-down Performed as group-led instruction   Resistance Training Performed Yes   VAD Patient? No     Pain Assessment   Currently in Pain? No/denies   Pain Score 0-No pain   Multiple Pain Sites No      Capillary Blood Glucose: No results found for this or any previous visit (from the past 24 hour(s)).    History  Smoking Status  . Never Smoker  Smokeless Tobacco  . Never Used    Goals Met:  Independence with exercise equipment Exercise tolerated well No report of cardiac concerns or symptoms Strength training completed today  Goals Unmet:  Not Applicable  Comments: Check out 1645.   Dr. Kate Sable is Medical Director for [facility] Cardiac and Pulmonary Rehab.

## 2017-07-13 ENCOUNTER — Encounter (HOSPITAL_COMMUNITY)
Admission: RE | Admit: 2017-07-13 | Discharge: 2017-07-13 | Disposition: A | Discharge status: Home / Self Care | Payer: BLUE CROSS/BLUE SHIELD | Source: Ambulatory Visit | Attending: Cardiology | Admitting: Cardiology

## 2017-07-13 ENCOUNTER — Ambulatory Visit: Payer: BLUE CROSS/BLUE SHIELD | Admitting: Cardiology

## 2017-07-13 DIAGNOSIS — Z951 Presence of aortocoronary bypass graft: Secondary | ICD-10-CM | POA: Diagnosis not present

## 2017-07-13 NOTE — Progress Notes (Signed)
Daily Session Note  Patient Details  Name: Douglas Cannon MRN: [id number] Date of Birth: May 26, 1956 Referring Provider:     CARDIAC REHAB PHASE II ORIENTATION from 05/19/2017 in [place]  Referring Provider  Dr. Harl Bowie      Encounter Date: 07/13/2017  Check In:     Session Check In - 07/13/17 1545      Check-In   Location [facility]   Staff Present Diane Angelina Pih, MS, EP, [facility], Exercise Physiologist;Micheal Murad Wynetta Emery, RN, BSN   Supervising physician immediately available to respond to emergencies See telemetry face sheet for immediately available MD   Medication changes reported     No   Fall or balance concerns reported    No   Warm-up and Cool-down Performed as group-led instruction   Resistance Training Performed Yes   VAD Patient? No     Pain Assessment   Currently in Pain? No/denies   Pain Score 0-No pain   Multiple Pain Sites No      Capillary Blood Glucose: No results found for this or any previous visit (from the past 24 hour(s)).      Exercise Prescription Changes - 07/13/17 1400      Response to Exercise   Blood Pressure (Admit) 130/68   Blood Pressure (Exercise) 140/70   Blood Pressure (Exit) 130/60   Heart Rate (Admit) 63 bpm   Heart Rate (Exercise) 85 bpm   Heart Rate (Exit) 72 bpm   Rating of Perceived Exertion (Exercise) 8   Duration Progress to 30 minutes of  aerobic without signs/symptoms of physical distress   Intensity THRR unchanged     Progression   Progression Continue to progress workloads to maintain intensity without signs/symptoms of physical distress.     Resistance Training   Training Prescription Yes   Weight 2   Reps 10-15     Treadmill   MPH 2.9   Grade 0   Minutes 15   METs 3.22     Recumbant Elliptical   Level 2   RPM 64   Watts 93   Minutes 20   METs 5.1     Home Exercise Plan   Plans to continue exercise at Home (comment)   Frequency Add 2 additional days to program exercise  sessions.      History  Smoking Status  . Never Smoker  Smokeless Tobacco  . Never Used    Goals Met:  Independence with exercise equipment Exercise tolerated well No report of cardiac concerns or symptoms Strength training completed today  Goals Unmet:  Not Applicable  Comments: Check out 1645.   Dr. Kate Sable is Medical Director for [facility] Cardiac and Pulmonary Rehab.

## 2017-07-15 ENCOUNTER — Encounter (HOSPITAL_COMMUNITY)
Admission: RE | Admit: 2017-07-15 | Discharge: 2017-07-15 | Disposition: A | Discharge status: Home / Self Care | Payer: BLUE CROSS/BLUE SHIELD | Source: Ambulatory Visit | Attending: Cardiology | Admitting: Cardiology

## 2017-07-15 DIAGNOSIS — Z951 Presence of aortocoronary bypass graft: Secondary | ICD-10-CM

## 2017-07-15 NOTE — Progress Notes (Signed)
Daily Session Note  Patient Details  Name: Douglas Cannon MRN: [id number] Date of Birth: May 15, 1956 Referring Provider:     CARDIAC REHAB PHASE II ORIENTATION from 05/19/2017 in [place]  Referring Provider  Dr. Harl Bowie      Encounter Date: 07/15/2017  Check In:     Session Check In - 07/15/17 1545      Check-In   Location [facility]   Staff Present Diane Angelina Pih, MS, EP, [facility], Exercise Physiologist;Zera Markwardt Wynetta Emery, RN, BSN   Supervising physician immediately available to respond to emergencies See telemetry face sheet for immediately available MD   Medication changes reported     No   Fall or balance concerns reported    No   Warm-up and Cool-down Performed as group-led instruction   Resistance Training Performed Yes   VAD Patient? No     Pain Assessment   Currently in Pain? No/denies   Pain Score 0-No pain   Multiple Pain Sites No      Capillary Blood Glucose: No results found for this or any previous visit (from the past 24 hour(s)).    History  Smoking Status  . Never Smoker  Smokeless Tobacco  . Never Used    Goals Met:  Independence with exercise equipment Exercise tolerated well No report of cardiac concerns or symptoms Strength training completed today  Goals Unmet:  Not Applicable  Comments: Check out 1645.   Dr. Kate Sable is Medical Director for [facility] Cardiac and Pulmonary Rehab.

## 2017-07-18 ENCOUNTER — Encounter (HOSPITAL_COMMUNITY): Payer: BLUE CROSS/BLUE SHIELD

## 2017-07-20 ENCOUNTER — Encounter (HOSPITAL_COMMUNITY)
Admission: RE | Admit: 2017-07-20 | Discharge: 2017-07-20 | Disposition: A | Discharge status: Home / Self Care | Payer: BLUE CROSS/BLUE SHIELD | Source: Ambulatory Visit | Attending: Cardiology | Admitting: Cardiology

## 2017-07-20 DIAGNOSIS — Z7902 Long term (current) use of antithrombotics/antiplatelets: Secondary | ICD-10-CM | POA: Insufficient documentation

## 2017-07-20 DIAGNOSIS — I130 Hypertensive heart and chronic kidney disease with heart failure and stage 1 through stage 4 chronic kidney disease, or unspecified chronic kidney disease: Secondary | ICD-10-CM | POA: Insufficient documentation

## 2017-07-20 DIAGNOSIS — Z7984 Long term (current) use of oral hypoglycemic drugs: Secondary | ICD-10-CM | POA: Insufficient documentation

## 2017-07-20 DIAGNOSIS — Z951 Presence of aortocoronary bypass graft: Secondary | ICD-10-CM | POA: Diagnosis present

## 2017-07-20 DIAGNOSIS — N183 Chronic kidney disease, stage 3 (moderate): Secondary | ICD-10-CM | POA: Diagnosis not present

## 2017-07-20 DIAGNOSIS — I5032 Chronic diastolic (congestive) heart failure: Secondary | ICD-10-CM | POA: Diagnosis not present

## 2017-07-20 DIAGNOSIS — E1122 Type 2 diabetes mellitus with diabetic chronic kidney disease: Secondary | ICD-10-CM | POA: Diagnosis not present

## 2017-07-20 DIAGNOSIS — Z79899 Other long term (current) drug therapy: Secondary | ICD-10-CM | POA: Diagnosis not present

## 2017-07-20 DIAGNOSIS — Z8739 Personal history of other diseases of the musculoskeletal system and connective tissue: Secondary | ICD-10-CM | POA: Insufficient documentation

## 2017-07-20 DIAGNOSIS — Z8701 Personal history of pneumonia (recurrent): Secondary | ICD-10-CM | POA: Insufficient documentation

## 2017-07-20 DIAGNOSIS — E785 Hyperlipidemia, unspecified: Secondary | ICD-10-CM | POA: Insufficient documentation

## 2017-07-20 DIAGNOSIS — Z7982 Long term (current) use of aspirin: Secondary | ICD-10-CM | POA: Diagnosis not present

## 2017-07-20 DIAGNOSIS — F419 Anxiety disorder, unspecified: Secondary | ICD-10-CM | POA: Insufficient documentation

## 2017-07-20 NOTE — Progress Notes (Signed)
Daily Session Note  Patient Details  Name: Douglas Cannon MRN: [id number] Date of Birth: September 03, 1956 Referring Provider:     CARDIAC REHAB PHASE II ORIENTATION from 05/19/2017 in [place]  Referring Provider  Dr. Harl Bowie      Encounter Date: 07/20/2017  Check In:     Session Check In - 07/20/17 1545      Check-In   Location [facility]   Staff Present Diane Angelina Pih, MS, EP, [facility], Exercise Physiologist;Shariff Lasky Wynetta Emery, RN, BSN   Supervising physician immediately available to respond to emergencies See telemetry face sheet for immediately available MD   Medication changes reported     No   Fall or balance concerns reported    No   Warm-up and Cool-down Performed as group-led instruction   Resistance Training Performed Yes   VAD Patient? No     Pain Assessment   Currently in Pain? No/denies   Pain Score 0-No pain   Multiple Pain Sites No      Capillary Blood Glucose: No results found for this or any previous visit (from the past 24 hour(s)).    History  Smoking Status  . Never Smoker  Smokeless Tobacco  . Never Used    Goals Met:  Independence with exercise equipment Exercise tolerated well No report of cardiac concerns or symptoms Strength training completed today  Goals Unmet:  Not Applicable  Comments: Check out 1645.   Dr. Kate Sable is Medical Director for [facility] Cardiac and Pulmonary Rehab.

## 2017-07-20 NOTE — Progress Notes (Signed)
Cardiac Individual Treatment Plan  Patient Details  Name: Douglas Cannon MRN: [id number] Date of Birth: December 21, 1963 Referring Provider:     CARDIAC REHAB PHASE II ORIENTATION from 05/19/2017 in [facility] CARD[facility]AC RE[facility]ABILITATION  Referring Provider  Dr. Wyline Mood      Initial Encounter Date:    CARDIAC REHAB PHASE II ORIENTATION from 05/19/2017 in [place] CARDIAC REHABILITATION  Date  05/19/17  Referring Provider  Dr. Wyline Mood      Visit Diagnosis: S/P CABG x 4  Patient's Home Medications on Admission:  Current Outpatient Prescriptions:  .  amLODipine (NORVASC) 10 MG tablet, Take 1 tablet (10 mg total) by mouth daily., Disp: 90 tablet, Rfl: 1 .  aspirin EC 81 MG tablet, Take 81 mg by mouth daily., Disp: , Rfl:  .  carvedilol (COREG) 25 MG tablet, Take 1 tablet (25 mg total) by mouth 2 (two) times daily., Disp: 180 tablet, Rfl: 3 .  citalopram (CELEXA) 20 MG tablet, Take 20 mg by mouth daily., Disp: , Rfl:  .  clopidogrel (PLAVIX) 75 MG tablet, Take 1 tablet (75 mg total) by mouth daily., Disp: 90 tablet, Rfl: 1 .  Cyanocobalamin (B-12 PO), Take 1 tablet by mouth daily., Disp: , Rfl:  .  DM-Phenylephrine-Acetaminophen (ALKA-SELTZER PLS SINUS & COUGH PO), Take 1 tablet by mouth daily as needed., Disp: , Rfl:  .  furosemide (LASIX) 40 MG tablet, Take 1 tablet (40 mg total) by mouth daily., Disp: 30 tablet, Rfl: 1 .  glimepiride (AMARYL) 4 MG tablet, Take 4 mg by mouth daily with breakfast., Disp: , Rfl:  .  metFORMIN (GLUCOPHAGE) 500 MG tablet, Take by mouth 2 (two) times daily with a meal., Disp: , Rfl:  .  rosuvastatin (CRESTOR) 5 MG tablet, Take 1 tablet (5 mg total) by mouth at bedtime., Disp: , Rfl:  .  triamcinolone ointment (KENALOG) 0.1 %, Apply 1 application topically 2 (two) times daily., Disp: , Rfl:  .  Zinc 50 MG TABS, Take 1 tablet by mouth daily., Disp: , Rfl:   Past Medical History: Past Medical History:  Diagnosis Date  . Anemia   . Anxiety   . CKD (chronic kidney  disease) stage 3, GFR 30-59 ml/min   . Diastolic heart failure [facility])    Episode December 2017 in the setting of pneumonia  . Essential hypertension   . History of pneumonia   . Hyperlipemia   . Osteomyelitis (HCC)   . Type 2 diabetes mellitus (HCC)     Tobacco Use: History  Smoking Status  . Never Smoker  Smokeless Tobacco  . Never Used    Labs: Recent Review Flowsheet Data    Labs for ITP Cardiac and Pulmonary Rehab Latest Ref Rng & Units 03/23/2017 03/23/2017 03/23/2017 03/23/2017 03/23/2017   Cholestrol 0 - 200 mg/dL - - - - -   LDLCALC 0 - 99 mg/dL - - - - -   HDL >78 mg/dL - - - - -   Trlycerides <150 mg/dL - - - - -   Hemoglobin A1c 4.8 - 5.6 % - - - - -   PHART 7.350 - 7.450 7.388 7.381 - 7.423 -   PCO2ART 32.0 - 48.0 mmHg 39.1 38.5 - 34.8 -   HCO3 20.0 - 28.0 mmol/L 23.4 22.7 - 22.7 -   TCO2 0 - 100 mmol/L 25 24 - 24 23   ACIDBASEDEF 0.0 - 2.0 mmol/L 1.0 2.0 - 1.0 -   O2SAT % 95.0 95.0 71.6 93.0 -  Capillary Blood Glucose: Lab Results  Component Value Date   GLUCAP 170 (H) 03/27/2017   GLUCAP 127 (H) 03/27/2017   GLUCAP 147 (H) 03/26/2017   GLUCAP 67 03/26/2017   GLUCAP 170 (H) 03/26/2017     Exercise Target Goals:    Exercise Program Goal: Individual exercise prescription set with THRR, safety & activity barriers. Participant demonstrates ability to understand and report RPE using BORG scale, to self-measure pulse accurately, and to acknowledge the importance of the exercise prescription.  Exercise Prescription Goal: Starting with aerobic activity 30 plus minutes a day, 3 days per week for initial exercise prescription. Provide home exercise prescription and guidelines that participant acknowledges understanding prior to discharge.  Activity Barriers & Risk Stratification:     Activity Barriers & Cardiac Risk Stratification - 05/19/17 1444      Activity Barriers & Cardiac Risk Stratification   Activity Barriers None   Cardiac Risk Stratification High       6 Minute Walk:     6 Minute Walk    Row Name 05/19/17 1454         6 Minute Walk   Phase Initial     Distance 1250 feet     Distance % Change 0 %     Walk Time 6 minutes     # of Rest Breaks 0     MPH 2.27     METS 2.74     RPE 7     Perceived Dyspnea  9     VO2 Peak 11.73     Symptoms No     Resting HR 61 bpm     Resting BP 160/82     Max Ex. HR 71 bpm     Max Ex. BP 162/94     2 Minute Post BP 150/66        Oxygen Initial Assessment:   Oxygen Re-Evaluation:   Oxygen Discharge (Final Oxygen Re-Evaluation):   Initial Exercise Prescription:     Initial Exercise Prescription - 05/19/17 1400      Date of Initial Exercise RX and Referring Provider   Date 05/19/17   Referring Provider Dr. Wyline MoodBranch     Treadmill   MPH 2   Grade 0   Minutes 15   METs 2.5     Recumbant Elliptical   Level 1   RPM 33   Watts 25   Minutes 20   METs 1.8     Prescription Details   Frequency (times per week) 3   Duration Progress to 30 minutes of continuous aerobic without signs/symptoms of physical distress     Intensity   THRR 40-80% of Max Heartrate [phone]   Ratings of Perceived Exertion 11-13   Perceived Dyspnea 0-4     Progression   Progression Continue progressive overload as per policy without signs/symptoms or physical distress.     Resistance Training   Training Prescription Yes   Weight 1   Reps 10-15      Perform Capillary Blood Glucose checks as needed.  Exercise Prescription Changes:      Exercise Prescription Changes    Row Name 06/06/17 1400 06/28/17 1000 07/13/17 1400         Response to Exercise   Blood Pressure (Admit) 140/70 132/60 130/68     Blood Pressure (Exercise) 120/60 124/72 140/70     Blood Pressure (Exit) 150/80 120/64 130/60     Heart Rate (Admit) 61 bpm 65 bpm 63 bpm     Heart  Rate (Exercise) 94 bpm 93 bpm 85 bpm     Heart Rate (Exit) 70 bpm 77 bpm 72 bpm     Rating of Perceived Exertion (Exercise) 8 8 8       Duration Progress to 30 minutes of  aerobic without signs/symptoms of physical distress Progress to 30 minutes of  aerobic without signs/symptoms of physical distress Progress to 30 minutes of  aerobic without signs/symptoms of physical distress     Intensity THRR unchanged THRR unchanged THRR unchanged       Progression   Progression Continue to progress workloads to maintain intensity without signs/symptoms of physical distress. Continue to progress workloads to maintain intensity without signs/symptoms of physical distress. Continue to progress workloads to maintain intensity without signs/symptoms of physical distress.       Resistance Training   Training Prescription Yes Yes Yes     Weight 2 2 2      Reps 10-15 10-15 10-15       Treadmill   MPH 2.5 2.7 2.9     Grade 0 0 0     Minutes 15 15 15      METs 2.9 3 3.22       Recumbant Elliptical   Level 1 2 2      RPM 68 91 64     Watts 90 68 93     Minutes 20 20 20      METs 5.1 5.3 5.1       Home Exercise Plan   Plans to continue exercise at Home (comment) Home (comment) Home (comment)     Frequency Add 2 additional days to program exercise sessions. Add 2 additional days to program exercise sessions. Add 2 additional days to program exercise sessions.        Exercise Comments:      Exercise Comments    Row Name 06/06/17 1454 06/28/17 1014 07/13/17 1429       Exercise Comments Patient is doing well in CR.  Patient is doing well in CR.  Patient is doing well in CR.         Exercise Goals and Review:      Exercise Goals    Row Name 05/19/17 1445             Exercise Goals   Increase Physical Activity Yes       Intervention Provide advice, education, support and counseling about physical activity/exercise needs.;Develop an individualized exercise prescription for aerobic and resistive training based on initial evaluation findings, risk stratification, comorbidities and participant's personal goals.       Expected  Outcomes Achievement of increased cardiorespiratory fitness and enhanced flexibility, muscular endurance and strength shown through measurements of functional capacity and personal statement of participant.       Increase Strength and Stamina Yes       Intervention Provide advice, education, support and counseling about physical activity/exercise needs.;Develop an individualized exercise prescription for aerobic and resistive training based on initial evaluation findings, risk stratification, comorbidities and participant's personal goals.       Expected Outcomes Achievement of increased cardiorespiratory fitness and enhanced flexibility, muscular endurance and strength shown through measurements of functional capacity and personal statement of participant.          Exercise Goals Re-Evaluation :     Exercise Goals Re-Evaluation    Row Name 06/20/17 1442 07/20/17 1418           Exercise Goal Re-Evaluation   Exercise Goals Review Increase Physical Activity;Increase Strenth and  Stamina Increase Physical Activity;Increase Strength and Stamina      Comments Patient has completed 13 sessions. He is progressing well in the program with increased speed on treadmill and increased weights and resistance bands. He says he is feeling stronger. Will continue to monitor.  Patient's mets are increasing. He continues to progress in the program and is getting stronger. Will continue to monitor.       Expected Outcomes Patient will complete the program with continued icreased strength, stamina, and activity. Patient will complete the program with continued increased strength, stamina, and activity.          Discharge Exercise Prescription (Final Exercise Prescription Changes):     Exercise Prescription Changes - 07/13/17 1400      Response to Exercise   Blood Pressure (Admit) 130/68   Blood Pressure (Exercise) 140/70   Blood Pressure (Exit) 130/60   Heart Rate (Admit) 63 bpm   Heart Rate (Exercise)  85 bpm   Heart Rate (Exit) 72 bpm   Rating of Perceived Exertion (Exercise) 8   Duration Progress to 30 minutes of  aerobic without signs/symptoms of physical distress   Intensity THRR unchanged     Progression   Progression Continue to progress workloads to maintain intensity without signs/symptoms of physical distress.     Resistance Training   Training Prescription Yes   Weight 2   Reps 10-15     Treadmill   MPH 2.9   Grade 0   Minutes 15   METs 3.22     Recumbant Elliptical   Level 2   RPM 64   Watts 93   Minutes 20   METs 5.1     Home Exercise Plan   Plans to continue exercise at Home (comment)   Frequency Add 2 additional days to program exercise sessions.      Nutrition:  Target Goals: Understanding of nutrition guidelines, daily intake of sodium 1500mg , cholesterol 200mg , calories 30% from fat and 7% or less from saturated fats, daily to have 5 or more servings of fruits and vegetables.  Biometrics:     Pre Biometrics - 05/19/17 1457      Pre Biometrics   Height 5\' 10"  (1.778 m)   Weight 176 lb 9.4 oz (80.1 kg)   Waist Circumference 38.5 inches   Hip Circumference 35.5 inches   Waist to Hip Ratio 1.08 %   BMI (Calculated) 25.4   Triceps Skinfold 5 mm   % Body Fat 21.2 %   Grip Strength 53.4 kg   Flexibility 10.75 in   Single Leg Stand 4 seconds       Nutrition Therapy Plan and Nutrition Goals:     Nutrition Therapy & Goals - 07/20/17 1418      Nutrition Therapy   RD appointment defered Yes      Nutrition Discharge: Rate Your Plate Scores:     Nutrition Assessments - 05/19/17 1457      MEDFICTS Scores   Pre Score 65      Nutrition Goals Re-Evaluation:   Nutrition Goals Discharge (Final Nutrition Goals Re-Evaluation):   Psychosocial: Target Goals: Acknowledge presence or absence of significant depression and/or stress, maximize coping skills, provide positive support system. Participant is able to verbalize types and ability  to use techniques and skills needed for reducing stress and depression.  Initial Review & Psychosocial Screening:     Initial Psych Review & Screening - 05/19/17 1517      Initial Review   Current issues  with None Identified     Family Dynamics   Good Support System? Yes     Barriers   Psychosocial barriers to participate in program There are no identifiable barriers or psychosocial needs.     Screening Interventions   Interventions Encouraged to exercise      Quality of Life Scores:     Quality of Life - 05/19/17 1458      Quality of Life Scores   Health/Function Pre 27.2 %   Socioeconomic Pre 24.75 %   Psych/Spiritual Pre 25.71 %   Family Pre 26.4 %   GLOBAL Pre 26.23 %      PHQ-9: Recent Review Flowsheet Data    Depression screen [facility] 2/9 05/19/2017   Decreased Interest 0   Down, Depressed, Hopeless 1   PHQ - 2 Score 1   Altered sleeping 0   Tired, decreased energy 0   Change in appetite 0   Feeling bad or failure about yourself  0   Trouble concentrating 0   Moving slowly or fidgety/restless 0   Suicidal thoughts 0   PHQ-9 Score 1   Difficult doing work/chores Not difficult at all     Interpretation of Total Score  Total Score Depression Severity:  1-4 = Minimal depression, 5-9 = Mild depression, 10-14 = Moderate depression, 15-19 = Moderately severe depression, 20-27 = Severe depression   Psychosocial Evaluation and Intervention:     Psychosocial Evaluation - 05/19/17 1517      Psychosocial Evaluation & Interventions   Interventions Encouraged to exercise with the program and follow exercise prescription;Relaxation education;Stress management education   Comments Patient has no psychosocial issues identified. His PHQ-9 score was 1 and his QOL score was 26.23.    Expected Outcomes Patient will have no psychosocial issues identified at discharge.    Continue Psychosocial Services  No Follow up required      Psychosocial Re-Evaluation:      Psychosocial Re-Evaluation    Row Name 06/20/17 1443 07/20/17 1419           Psychosocial Re-Evaluation   Current issues with None Identified None Identified      Expected Outcomes Patient will have no psychosocial barriers identified at discharge.  Patient will have no psychosocial issues identified at discharge.       Interventions Encouraged to attend Cardiac Rehabilitation for the exercise;Stress management education;Relaxation education Encouraged to attend Cardiac Rehabilitation for the exercise      Continue Psychosocial Services  No Follow up required No Follow up required         Psychosocial Discharge (Final Psychosocial Re-Evaluation):     Psychosocial Re-Evaluation - 07/20/17 1419      Psychosocial Re-Evaluation   Current issues with None Identified   Expected Outcomes Patient will have no psychosocial issues identified at discharge.    Interventions Encouraged to attend Cardiac Rehabilitation for the exercise   Continue Psychosocial Services  No Follow up required      Vocational Rehabilitation: Provide vocational rehab assistance to qualifying candidates.   Vocational Rehab Evaluation & Intervention:     Vocational Rehab - 05/19/17 1443      Initial Vocational Rehab Evaluation & Intervention   Assessment shows need for Vocational Rehabilitation No      Education: Education Goals: Education classes will be provided on a weekly basis, covering required topics. Participant will state understanding/return demonstration of topics presented.  Learning Barriers/Preferences:     Learning Barriers/Preferences - 05/19/17 1442      Learning  Barriers/Preferences   Learning Barriers None   Learning Preferences Video;Pictoral;Computer/Internet;Written Material      Education Topics: Hypertension, Hypertension Reduction -Define heart disease and high blood pressure. Discus how high blood pressure affects the body and ways to reduce high blood pressure.    CARDIAC REHAB PHASE II EXERCISE from 07/15/2017 in [facility]ARDIAC RE[facility]IT[facility]  Date  06/15/17  Educator  DC      Exercise and Your Heart -Discuss why it is important to exercise, the FITT principles of exercise, normal and abnormal responses to exercise, and how to exercise safely.   CARDIAC REHAB PHASE II EXERCISE from 07/15/2017 in [place] [facility] CARDIAC REH[facility]ITATION  Date  06/22/17  Educator  DC      Angina -Discuss definition of angina, causes of angina, treatment of angina, and how to decrease risk of having angina.   CARDIAC REHAB PHASE II EXERCISE from 07/15/2017 in [place] [facility] CARDIAC RE[facility]  Date  06/29/17  Educator  DC      Cardiac Medications -Review what the following cardiac medications are used for, how they affect the body, and side effects that may occur when taking the medications.  Medications include Aspirin, Beta blockers, calcium channel blockers, ACE Inhibitors, angiotensin receptor blockers, diuretics, digoxin, and antihyperlipidemics.   CARDIAC REHAB PHASE II EXERCISE from 07/15/2017 in [place] CARDIAC REHABILITATION  Date  07/06/17  Educator  [employer] Ex[employer]  Instruction Review Code  2- meets goals/outcomes      Congestive Heart Failure -Discuss the definition of CHF, how to live with CHF, the signs and symptoms of CHF, and how keep track of weight and sodium intake.   CARDIAC REHAB PHASE II EXERCISE from 07/15/2017 in [place] [facility]  Date  07/13/17  Educator  DC  Instruction Review Code  2- Demonstrated Understanding      Heart Disease and Intimacy -Discus the effect sexual activity has on the heart, how changes occur during intimacy as we age, and safety during sexual activity.   Smoking Cessation / COPD -Discuss different methods to quit smoking, the health benefits of quitting smoking, and the definition of COPD.   Nutrition I: Fats -Discuss the types of cholesterol, what cholesterol does to the heart, and how  cholesterol levels can be controlled.   Nutrition II: Labels -Discuss the different components of food labels and how to read food label   Heart Parts and Heart Disease -Discuss the anatomy of the heart, the pathway of blood circulation through the heart, and these are affected by heart disease.   Stress I: Signs and Symptoms -Discuss the causes of stress, how stress may lead to anxiety and depression, and ways to limit stress.   CARDIAC REHAB PHASE II EXERCISE from 07/15/2017 in [place] [facility]  Date  05/25/17  Educator  GC      Stress II: Relaxation -Discuss different types of relaxation techniques to limit stress.   CARDIAC REHAB PHASE II EXERCISE from 07/15/2017 in [place] [facility]  Date  06/01/17  Educator  GC      Warning Signs of Stroke / TIA -Discuss definition of a stroke, what the signs and symptoms are of a stroke, and how to identify when someone is having stroke.   CARDIAC REHAB PHASE II EXERCISE from 07/15/2017 in [facility] CARDIAC REHABILITATION  Date  06/08/17  Educator  GC      Knowledge Questionnaire Score:     Knowledge Questionnaire Score - 05/19/17 1443      Knowledge  Questionnaire Score   Pre Score 22/24      Core Components/Risk Factors/Patient Goals at Admission:     Personal Goals and Risk Factors at Admission - 05/19/17 1459      Core Components/Risk Factors/Patient Goals on Admission    Weight Management Yes   Intervention Weight Management: Develop a combined nutrition and exercise program designed to reach desired caloric intake, while maintaining appropriate intake of nutrient and fiber, sodium and fats, and appropriate energy expenditure required for the weight goal.;Obesity: Provide education and appropriate resources to help participant work on and attain dietary goals.;Weight Management/Obesity: Establish reasonable short term and long term weight goals.;Weight Management: Provide education and  appropriate resources to help participant work on and attain dietary goals.   Admit Weight 176 lb 11.2 oz (80.2 kg)   Goal Weight: Short Term 171 lb 11.2 oz (77.9 kg)   Goal Weight: Long Term 166 lb 11.2 oz (75.6 kg)   Expected Outcomes Short Term: Continue to assess and modify interventions until short term weight is achieved;Long Term: Adherence to nutrition and physical activity/exercise program aimed toward attainment of established weight goal;Weight Gain: Understanding of general recommendations for a high calorie, high protein meal plan that promotes weight gain by distributing calorie intake throughout the day with the consumption for 4-5 meals, snacks, and/or supplements   Diabetes Yes   Intervention Provide education about signs/symptoms and action to take for hypo/hyperglycemia.;Provide education about proper nutrition, including hydration, and aerobic/resistive exercise prescription along with prescribed medications to achieve blood glucose in normal ranges: Fasting glucose 65-99 mg/dL   Expected Outcomes Long Term: Attainment of HbA1C < 7%.   Hypertension Yes   Intervention Provide education on lifestyle modifcations including regular physical activity/exercise, weight management, moderate sodium restriction and increased consumption of fresh fruit, vegetables, and low fat dairy, alcohol moderation, and smoking cessation.   Expected Outcomes Short Term: Continued assessment and intervention until BP is < 140/80mm HG in hypertensive participants. < 130/83mm HG in hypertensive participants with diabetes, heart failure or chronic kidney disease.;Long Term: Maintenance of blood pressure at goal levels.   Personal Goal Other Yes   Personal Goal Lose 5-10 lbs. Gain muscle tone; increaase strength.   Intervention Patient will attend CR 3 days/week and supplement with exersice at home 2 days/week.   Expected Outcomes Patient will meet his personal goals.       Core Components/Risk  Factors/Patient Goals Review:      Goals and Risk Factor Review    Row Name 06/20/17 1438 07/20/17 1413           Core Components/Risk Factors/Patient Goals Review   Personal Goals Review Weight Management/Obesity;Diabetes  Lose 5-10 lbs; gain muscle tone and strength. Weight Management/Obesity  Lose 5-10 lbs; gain muscle tone; increase strength.      Review Patient has completed 13 sessions gaining 6.6 lbs. He is doing well in the program. His last A1C was last week was 6.0. He is working full time without difficulty. He says he feels stronger and better. Will continue to monitor.  Patient has completed 25 sessions gaining 8.3 lbs. He continues to do well in the program. He says he feels better overall and says the program is helping him meet his goals. He feels like he is gaining strength. Will continue to monitor.       Expected Outcomes Patient will complete the program and meet his personal goals.  Patient will complete the program meeting his personal goals.  Core Components/Risk Factors/Patient Goals at Discharge (Final Review):      Goals and Risk Factor Review - 07/20/17 1413      Core Components/Risk Factors/Patient Goals Review   Personal Goals Review Weight Management/Obesity  Lose 5-10 lbs; gain muscle tone; increase strength.   Review Patient has completed 25 sessions gaining 8.3 lbs. He continues to do well in the program. He says he feels better overall and says the program is helping him meet his goals. He feels like he is gaining strength. Will continue to monitor.    Expected Outcomes Patient will complete the program meeting his personal goals.       ITP Comments:     ITP Comments    Row Name 05/19/17 1444 05/23/17 0745         ITP Comments Mr. Siefring is a pleasant 61 year old male. He has returned to work.  Patient new to program. Plans to start today 05/23/17.         Comments: ITP 30 Day REVIEW Patient doing well in the program. Will continue  to monitor for progress.

## 2017-07-22 ENCOUNTER — Encounter (HOSPITAL_COMMUNITY)
Admission: RE | Admit: 2017-07-22 | Discharge: 2017-07-22 | Disposition: A | Discharge status: Home / Self Care | Payer: BLUE CROSS/BLUE SHIELD | Source: Ambulatory Visit | Attending: Cardiology | Admitting: Cardiology

## 2017-07-22 DIAGNOSIS — Z951 Presence of aortocoronary bypass graft: Secondary | ICD-10-CM

## 2017-07-22 NOTE — Progress Notes (Signed)
Daily Session Note  Patient Details  Name: Douglas Cannon MRN: [id number] Date of Birth: [birth year] Referring Provider:     CARDIAC REHAB PHASE II ORIENTATION from 05/19/2017 in [place]  Referring Provider  Dr. Harl Bowie      Encounter Date: 07/22/2017  Check In:     Session Check In - 07/22/17 1545      Check-In   Location [facility]   Staff Present Aundra Dubin, RN, BSN;Gregory Luther Parody, BS, EP, Exercise Physiologist   Supervising physician immediately available to respond to emergencies See telemetry face sheet for immediately available MD   Medication changes reported     No   Fall or balance concerns reported    No   Warm-up and Cool-down Performed as group-led instruction   Resistance Training Performed Yes   VAD Patient? No     Pain Assessment   Currently in Pain? No/denies   Pain Score 0-No pain   Multiple Pain Sites No      Capillary Blood Glucose: No results found for this or any previous visit (from the past 24 hour(s)).    History  Smoking Status  . Never Smoker  Smokeless Tobacco  . Never Used    Goals Met:  Independence with exercise equipment Exercise tolerated well No report of cardiac concerns or symptoms Strength training completed today  Goals Unmet:  Not Applicable  Comments: Check out 1645.   Dr. Kate Sable is Medical Director for [facility] Cardiac and Pulmonary Rehab.

## 2017-07-25 ENCOUNTER — Encounter (HOSPITAL_COMMUNITY)
Admission: RE | Admit: 2017-07-25 | Discharge: 2017-07-25 | Disposition: A | Discharge status: Home / Self Care | Payer: BLUE CROSS/BLUE SHIELD | Source: Ambulatory Visit | Attending: Cardiology | Admitting: Cardiology

## 2017-07-25 DIAGNOSIS — Z951 Presence of aortocoronary bypass graft: Secondary | ICD-10-CM | POA: Diagnosis not present

## 2017-07-25 NOTE — Progress Notes (Signed)
Daily Session Note  Patient Details  Name: Douglas Cannon MRN: [id number] Date of Birth: [birth year] Referring Provider:     CARDIAC REHAB PHASE II ORIENTATION from 05/19/2017 in [place]  Referring Provider  Dr. Harl Bowie      Encounter Date: 07/25/2017  Check In:     Session Check In - 07/25/17 1545      Check-In   Location [facility]   Staff Present Aundra Dubin, RN, BSN;Gregory Luther Parody, BS, EP, Exercise Physiologist   Supervising physician immediately available to respond to emergencies See telemetry face sheet for immediately available MD   Medication changes reported     No   Fall or balance concerns reported    No   Warm-up and Cool-down Performed as group-led instruction   Resistance Training Performed Yes   VAD Patient? No     Pain Assessment   Currently in Pain? No/denies   Pain Score 0-No pain   Multiple Pain Sites No      Capillary Blood Glucose: No results found for this or any previous visit (from the past 24 hour(s)).    History  Smoking Status  . Never Smoker  Smokeless Tobacco  . Never Used    Goals Met:  Independence with exercise equipment Exercise tolerated well No report of cardiac concerns or symptoms Strength training completed today  Goals Unmet:  Not Applicable  Comments: Check out 1645.   Dr. Kate Sable is Medical Director for [facility] Cardiac and Pulmonary Rehab.

## 2017-07-27 ENCOUNTER — Encounter (HOSPITAL_COMMUNITY)
Admission: RE | Admit: 2017-07-27 | Discharge: 2017-07-27 | Disposition: A | Discharge status: Home / Self Care | Payer: BLUE CROSS/BLUE SHIELD | Source: Ambulatory Visit | Attending: Cardiology | Admitting: Cardiology

## 2017-07-27 DIAGNOSIS — Z951 Presence of aortocoronary bypass graft: Secondary | ICD-10-CM | POA: Diagnosis not present

## 2017-07-27 NOTE — Progress Notes (Signed)
Daily Session Note  Patient Details  Name: ZAILEN ALBARRAN MRN: [id number] Date of Birth: [birth year] Referring Provider:     CARDIAC REHAB PHASE II ORIENTATION from 05/19/2017 in [place]  Referring Provider  Dr. Harl Bowie      Encounter Date: 07/27/2017  Check In:     Session Check In - 07/27/17 1545      Check-In   Location [facility]   Staff Present Aundra Dubin, RN, BSN;Gregory Luther Parody, BS, EP, Exercise Physiologist   Supervising physician immediately available to respond to emergencies See telemetry face sheet for immediately available MD   Medication changes reported     No   Fall or balance concerns reported    No   Warm-up and Cool-down Performed as group-led instruction   Resistance Training Performed Yes   VAD Patient? No     Pain Assessment   Currently in Pain? No/denies   Pain Score 0-No pain   Multiple Pain Sites No      Capillary Blood Glucose: No results found for this or any previous visit (from the past 24 hour(s)).    History  Smoking Status  . Never Smoker  Smokeless Tobacco  . Never Used    Goals Met:  Independence with exercise equipment Exercise tolerated well No report of cardiac concerns or symptoms Strength training completed today  Goals Unmet:  Not Applicable  Comments: Check out 1645.   Dr. Kate Sable is Medical Director for [facility] Cardiac and Pulmonary Rehab.

## 2017-07-29 ENCOUNTER — Encounter (HOSPITAL_COMMUNITY)
Admission: RE | Admit: 2017-07-29 | Discharge: 2017-07-29 | Disposition: A | Discharge status: Home / Self Care | Payer: BLUE CROSS/BLUE SHIELD | Source: Ambulatory Visit | Attending: Cardiology | Admitting: Cardiology

## 2017-07-29 DIAGNOSIS — Z951 Presence of aortocoronary bypass graft: Secondary | ICD-10-CM | POA: Diagnosis not present

## 2017-07-29 NOTE — Progress Notes (Signed)
Daily Session Note  Patient Details  Name: Douglas Cannon MRN: [id number] Date of Birth: [birth year] Referring Provider:     CARDIAC REHAB PHASE II ORIENTATION from 05/19/2017 in [place]  Referring Provider  Dr. Harl Bowie      Encounter Date: 07/29/2017  Check In:     Session Check In - 07/29/17 0811      Check-In   Location [facility]   Staff Present Aundra Dubin, RN, BSN;Euretha Najarro Luther Parody, BS, EP, Exercise Physiologist   Supervising physician immediately available to respond to emergencies See telemetry face sheet for immediately available MD   Medication changes reported     No   Fall or balance concerns reported    No   Warm-up and Cool-down Performed as group-led instruction   Resistance Training Performed Yes   VAD Patient? No     Pain Assessment   Currently in Pain? No/denies   Pain Score 0-No pain   Multiple Pain Sites No      Capillary Blood Glucose: No results found for this or any previous visit (from the past 24 hour(s)).    History  Smoking Status  . Never Smoker  Smokeless Tobacco  . Never Used    Goals Met:  Independence with exercise equipment Exercise tolerated well No report of cardiac concerns or symptoms Strength training completed today  Goals Unmet:  Not Applicable  Comments: Check out 915   Dr. Kate Sable is Medical Director for [place] and Pulmonary Rehab.

## 2017-08-01 ENCOUNTER — Encounter (HOSPITAL_COMMUNITY)
Admission: RE | Admit: 2017-08-01 | Discharge: 2017-08-01 | Disposition: A | Discharge status: Home / Self Care | Payer: BLUE CROSS/BLUE SHIELD | Source: Ambulatory Visit | Attending: Cardiology | Admitting: Cardiology

## 2017-08-01 ENCOUNTER — Encounter: Payer: Self-pay | Admitting: Cardiology

## 2017-08-01 ENCOUNTER — Ambulatory Visit (INDEPENDENT_AMBULATORY_CARE_PROVIDER_SITE_OTHER): Payer: BLUE CROSS/BLUE SHIELD | Admitting: Cardiology

## 2017-08-01 VITALS — BP 124/70 | HR 66 | Ht 70.0 in | Wt 188.0 lb

## 2017-08-01 DIAGNOSIS — I2510 Atherosclerotic heart disease of native coronary artery without angina pectoris: Secondary | ICD-10-CM

## 2017-08-01 DIAGNOSIS — I5022 Chronic systolic (congestive) heart failure: Secondary | ICD-10-CM | POA: Diagnosis not present

## 2017-08-01 DIAGNOSIS — N183 Chronic kidney disease, stage 3 unspecified: Secondary | ICD-10-CM

## 2017-08-01 DIAGNOSIS — Z951 Presence of aortocoronary bypass graft: Secondary | ICD-10-CM

## 2017-08-01 DIAGNOSIS — I10 Essential (primary) hypertension: Secondary | ICD-10-CM | POA: Diagnosis not present

## 2017-08-01 MED ORDER — FUROSEMIDE 40 MG PO TABS: 40.0000 mg | ORAL_TABLET | Freq: Every day | ORAL | 3 refills | Status: DC

## 2017-08-01 MED ORDER — CLOPIDOGREL BISULFATE 75 MG PO TABS: 75.0000 mg | ORAL_TABLET | Freq: Every day | ORAL | 3 refills | Status: DC

## 2017-08-01 MED ORDER — CARVEDILOL 25 MG PO TABS: 25.0000 mg | ORAL_TABLET | Freq: Two times a day (BID) | ORAL | 3 refills | Status: DC

## 2017-08-01 MED ORDER — ROSUVASTATIN CALCIUM 5 MG PO TABS: 5.0000 mg | ORAL_TABLET | Freq: Every day | ORAL | 3 refills | Status: DC

## 2017-08-01 MED ORDER — AMLODIPINE BESYLATE 10 MG PO TABS: 10.0000 mg | ORAL_TABLET | Freq: Every day | ORAL | 3 refills | Status: DC

## 2017-08-01 NOTE — Progress Notes (Signed)
Daily Session Note  Patient Details  Name: Douglas Cannon MRN: [id number] Date of Birth: [birth year] Referring Provider:     CARDIAC REHAB PHASE II ORIENTATION from 05/19/2017 in [place]  Referring Provider  Dr. Harl Bowie      Encounter Date: 08/01/2017  Check In:     Session Check In - 08/01/17 1542      Check-In   Location [facility]   Staff Present Aundra Dubin, RN, BSN;Gregory Luther Parody, BS, EP, Exercise Physiologist;Diane Coad, MS, EP, [facility], Exercise Physiologist   Supervising physician immediately available to respond to emergencies See telemetry face sheet for immediately available MD   Medication changes reported     No   Fall or balance concerns reported    No   Warm-up and Cool-down Performed as group-led instruction   Resistance Training Performed Yes   VAD Patient? No     Pain Assessment   Currently in Pain? No/denies   Pain Score 0-No pain   Multiple Pain Sites No      Capillary Blood Glucose: No results found for this or any previous visit (from the past 24 hour(s)).      Exercise Prescription Changes - 08/01/17 1500      Response to Exercise   Blood Pressure (Admit) 124/60   Blood Pressure (Exercise) 120/64   Blood Pressure (Exit) 124/64   Heart Rate (Admit) 58 bpm   Heart Rate (Exercise) 67 bpm   Heart Rate (Exit) 67 bpm   Rating of Perceived Exertion (Exercise) 11   Duration Progress to 30 minutes of  aerobic without signs/symptoms of physical distress   Intensity THRR New  [phone]     Progression   Progression Continue to progress workloads to maintain intensity without signs/symptoms of physical distress.     Resistance Training   Training Prescription Yes   Weight 2   Reps 10-15     Treadmill   MPH 3   Grade 0   Minutes 15   METs 3.4     Recumbant Elliptical   Level 3   RPM 66   Watts 86   Minutes 20   METs 4.8     Home Exercise Plan   Plans to continue exercise at Home (comment)   Frequency Add 2 additional days to program exercise sessions.      History  Smoking Status  . Never Smoker  Smokeless Tobacco  . Never Used    Goals Met:  Independence with exercise equipment Exercise tolerated well No report of cardiac concerns or symptoms Strength training completed today  Goals Unmet:  Not Applicable  Comments: Check out 1645.   Dr. Kate Sable is Medical Director for [facility] Cardiac and Pulmonary Rehab.

## 2017-08-01 NOTE — Progress Notes (Signed)
Clinical Summary [name] is a 61 y.o.male seen today for follow up of the following medical problems.    1. CAD - admit with NSTEMI, 03/2017 cath showed multivessel CAD and referred for CABG - echo 02/2017 LVEF 30-35%, abnormal diastolic function, mild AI, mild MR - 03/22/17 4 vessel CABG (LIMA-LAD, SVG-Diag, SVG to OM1, SVG-OM2) - started on plavix due to use of cryo veins during surgery and presenting with NSTEMI   - participating in cardiac rehab, graduating on Friday.  - no recent chest pain or SOB - compliant with meds.    2. Chronic systolic HF/ICM - 02/2017 LVEF 78-29% - L>R leg swelling for many years.  - Has not been on ACE/ARB/ARNI due to poor renal function   - home weights stable 178-183 lbs. Limiting sodium intake.  - last visit we increased his beta blocker, tolerating without side effects.    3.  CKD 3 - renal function has slowly been improving over last few checks.     4. HTN - home cuff off by 15, adjusted 130s-140s/80s.   -Bps at rehab 120s-130s/60-80s - last visit we increased norvasc to  daily due to elevated bp's.     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   Past Medical History:  Diagnosis Date  . Anemia   . Anxiety   . CKD (chronic kidney disease) stage 3, GFR 30-59 ml/min   . Diastolic heart failure [facility])    Episode December 2017 in the setting of pneumonia  . Essential hypertension   . History of pneumonia   . Hyperlipemia   . Osteomyelitis (HCC)   . Type 2 diabetes mellitus (HCC)      Allergies  Allergen Reactions  . No Known Allergies      Current Outpatient Prescriptions  Medication Sig Dispense Refill  . amLODipine (NORVASC) 10 MG tablet Take 1 tablet (10 mg total) by mouth daily. 90 tablet 1  . aspirin EC 81 MG tablet Take 81 mg by mouth daily.    . carvedilol (COREG) 25 MG tablet Take 1 tablet (25 mg total) by mouth 2 (two) times daily. 180 tablet 3  . citalopram (CELEXA) 20 MG tablet Take 20 mg by  mouth daily.    . clopidogrel (PLAVIX) 75 MG tablet Take 1 tablet (75 mg total) by mouth daily. 90 tablet 1  . Cyanocobalamin (B-12 PO) Take 1 tablet by mouth daily.    Marland Kitchen DM-Phenylephrine-Acetaminophen (ALKA-SELTZER PLS SINUS & COUGH PO) Take 1 tablet by mouth daily as needed.    . furosemide (LASIX) 40 MG tablet Take 1 tablet (40 mg total) by mouth daily. 30 tablet 1  . glimepiride (AMARYL) 4 MG tablet Take 4 mg by mouth daily with breakfast.    . metFORMIN (GLUCOPHAGE) 500 MG tablet Take by mouth 2 (two) times daily with a meal.    . rosuvastatin (CRESTOR) 5 MG tablet Take 1 tablet (5 mg total) by mouth at bedtime.    . triamcinolone ointment (KENALOG) 0.1 % Apply 1 application topically 2 (two) times daily.    . Zinc 50 MG TABS Take 1 tablet by mouth daily.     No current facility-administered medications for this visit.      Past Surgical History:  Procedure Laterality Date  . AMPUTATION Right 12/24/2015   Procedure: PARTIAL THIRD RAY AMPUTATION OF THE RIGHT FOOT (AMPUTATION OF THE THIRD TOE AND A PORTION OF THE THIRD METATARSAL);  Surgeon: Ferman Hamming, DPM;  Location: [facility]  ORS;  Service: Podiatry;  Laterality: Right;  . CORONARY ARTERY BYPASS GRAFT N/A 03/22/2017   Procedure: CORONARY ARTERY BYPASS GRAFTING (CABG)x4 using left internal mammary artery, cryo shapenous vein and left greater saphenous vein harvested endoscopically.  LIMA-LAD SVG-DIAD CRYOVEIN -OM2 CRYOVEIN -OM1 EVH LEFT THIGH, RIGHT GSV EXPLORED -TOO SMALL TO USE;  Surgeon: Kerin Perna, MD;  Location: [facility] OR;  Service: Open Heart Surgery;  Laterality: N/A;  . LEFT HEART CATH AND CORONARY ANGIOGRAPHY N/A 03/15/2017   Procedure: Left Heart Cath and Coronary Angiography;  Surgeon: Lennette Bihari, MD;  Location: [facility] INVASIVE CV LAB;  Service: Cardiovascular;  Laterality: N/A;  . RIGHT HEART CATH N/A 03/18/2017   Procedure: Right Heart Cath;  Surgeon: Laurey Morale, MD;  Location: [facility] INVASIVE CV LAB;  Service: Cardiovascular;   Laterality: N/A;  . TEE WITHOUT CARDIOVERSION N/A 03/22/2017   Procedure: TRANSESOPHAGEAL ECHOCARDIOGRAM (TEE);  Surgeon: Donata Clay, Theron Arista, MD;  Location: [facility] OR;  Service: Open Heart Surgery;  Laterality: N/A;     Allergies  Allergen Reactions  . No Known Allergies       Family History  Problem Relation Age of Onset  . Adopted: Yes     Social History [name] reports that he has never smoked. He has never used smokeless tobacco. [name] reports that he does not drink alcohol.   Review of Systems CONSTITUTIONAL: No weight loss, fever, chills, weakness or fatigue.  HEENT: Eyes: No visual loss, blurred vision, double vision or yellow sclerae.No hearing loss, sneezing, congestion, runny nose or sore throat.  SKIN: No rash or itching.  CARDIOVASCULAR: per hpi RESPIRATORY:per hpi GASTROINTESTINAL: No anorexia, nausea, vomiting or diarrhea. No abdominal pain or blood.  GENITOURINARY: No burning on urination, no polyuria NEUROLOGICAL: No headache, dizziness, syncope, paralysis, ataxia, numbness or tingling in the extremities. No change in bowel or bladder control.  MUSCULOSKELETAL: No muscle, back pain, joint pain or stiffness.  LYMPHATICS: No enlarged nodes. No history of splenectomy.  PSYCHIATRIC: No history of depression or anxiety.  ENDOCRINOLOGIC: No reports of sweating, cold or heat intolerance. No polyuria or polydipsia.  Marland Kitchen   Physical Examination Vitals:   08/01/17 1256  BP: 124/70  Pulse: 66  SpO2: 97%   Vitals:   08/01/17 1256  Weight: 188 lb (85.3 kg)  Height:  (1.778 m)    Gen: resting comfortably, no acute distress HEENT: no scleral icterus, pupils equal round and reactive, no palptable cervical adenopathy,  CV: RRR, no m/rg, no jvd Resp: Clear to auscultation bilaterally GI: abdomen is soft, non-tender, non-distended, normal bowel sounds, no hepatosplenomegaly MSK: extremities are warm, no edema.  Skin: warm, no rash Neuro:  no focal  deficits Psych: appropriate affect   Diagnostic Studies 03/2017 cath  LM lesion, 90 %stenosed.  Ost LAD to Prox LAD lesion, 95 %stenosed.  Ost 1st Diag to 1st Diag lesion, 90 %stenosed.  Ost 3rd Mrg to 3rd Mrg lesion, 95 %stenosed.  Mid Cx to Dist Cx lesion, 90 %stenosed.  RPDA lesion, 85 %stenosed.  Mid RCA lesion, 20 %stenosed.  Acute Mrg lesion, 95 %stenosed.  Findings are compatible with a significant ischemic cardiomyopathy in this patient with echo Doppler documentation of an EF of 30-35%.  Severe multivessel CAD with coronary calcification and diffuse 90-95% near ostial to mid LAD stenoses with diffuse 90% stenosis in the first diagonal vessel; 95 and 90% diffuse stenoses in segmental obtuse marginal branches of the left circumflex coronary artery with good distal targets, and diffuse  80% to 90% PDA stenosis with 20% mid RCA stenosis and 95% proximal stenosis and a small proximal Monea Pesantez of the RCA.  LVEDP 34 mm Hg.  RECOMMENDATION: Surgical consultation will be obtained for CABG revascularization surgery.     Assessment and Plan   1. CAD - s/p CABG, denies any recent chest pain or SOB - continue current meds   2. Chronic systolic HF -  No ACE/ARB/aldactone due to renal function - repeat labs, pending renal function consider low dose ACE-I - repeat echo after next visit.   3. CKD 3 - repeat labs  4. HTN - at goal, continue current meds   F/u 6 weeks. Will order a repeat echo at that visit     Antoine Poche, M.D.

## 2017-08-01 NOTE — Patient Instructions (Signed)
Medication Instructions:  Your physician recommends that you continue on your current medications as directed. Please refer to the Current Medication list given to you today.   Labwork: ASAP BMET FASTING LIPID   Testing/Procedures: NONE  Follow-Up: Your physician recommends that you schedule a follow-up appointment in: 6 WEEKS    Any Other Special Instructions Will Be Listed Below (If Applicable).     If you need a refill on your cardiac medications before your next appointment, please call your pharmacy.

## 2017-08-02 ENCOUNTER — Other Ambulatory Visit (HOSPITAL_COMMUNITY)
Admission: RE | Admit: 2017-08-02 | Discharge: 2017-08-02 | Disposition: A | Discharge status: Home / Self Care | Payer: BLUE CROSS/BLUE SHIELD | Source: Ambulatory Visit | Attending: Cardiology | Admitting: Cardiology

## 2017-08-02 DIAGNOSIS — Z951 Presence of aortocoronary bypass graft: Secondary | ICD-10-CM | POA: Insufficient documentation

## 2017-08-02 LAB — BASIC METABOLIC PANEL
Anion gap: 10 (ref 5–15)
BUN: 44 mg/dL — ABNORMAL HIGH (ref 6–20)
CO2: 26 mmol/L (ref 22–32)
Calcium: 9.1 mg/dL (ref 8.9–10.3)
Chloride: 103 mmol/L (ref 101–111)
Creatinine, Ser: 2.19 mg/dL — ABNORMAL HIGH (ref 0.61–1.24)
GFR calc Af Amer: 36 mL/min — ABNORMAL LOW (ref >60)
GFR calc non Af Amer: 31 mL/min — ABNORMAL LOW (ref >60)
Glucose, Bld: 147 mg/dL — ABNORMAL HIGH (ref 65–99)
Potassium: 4.6 mmol/L (ref 3.5–5.1)
Sodium: 139 mmol/L (ref 135–145)

## 2017-08-02 LAB — LIPID PANEL
Cholesterol: 123 mg/dL (ref 0–200)
HDL: 43 mg/dL (ref >40)
LDL Cholesterol: 68 mg/dL (ref 0–99)
Total CHOL/HDL Ratio: 2.9 RATIO
Triglycerides: 59 mg/dL (ref <150)
VLDL: 12 mg/dL (ref 0–40)

## 2017-08-03 ENCOUNTER — Encounter (HOSPITAL_COMMUNITY)
Admission: RE | Admit: 2017-08-03 | Discharge: 2017-08-03 | Disposition: A | Discharge status: Home / Self Care | Payer: BLUE CROSS/BLUE SHIELD | Source: Ambulatory Visit | Attending: Cardiology | Admitting: Cardiology

## 2017-08-03 DIAGNOSIS — Z951 Presence of aortocoronary bypass graft: Secondary | ICD-10-CM

## 2017-08-03 NOTE — Progress Notes (Signed)
Daily Session Note  Patient Details  Name: DEWAINE MOROCHO MRN: [id number] Date of Birth: 1956-02-15 Referring Provider:     CARDIAC REHAB PHASE II ORIENTATION from 05/19/2017 in [place]  Referring Provider  Dr. Harl Bowie      Encounter Date: 08/03/2017  Check In:     Session Check In - 08/03/17 1545      Check-In   Location [facility]   Staff Present Diane Angelina Pih, MS, EP, [facility], Exercise Physiologist;Yesica Kemler Wynetta Emery, RN, BSN   Supervising physician immediately available to respond to emergencies See telemetry face sheet for immediately available MD   Medication changes reported     No   Fall or balance concerns reported    No   Warm-up and Cool-down Performed as group-led instruction   Resistance Training Performed Yes   VAD Patient? No     Pain Assessment   Currently in Pain? No/denies   Pain Score 0-No pain   Multiple Pain Sites No      Capillary Blood Glucose: No results found for this or any previous visit (from the past 24 hour(s)).    History  Smoking Status  . Never Smoker  Smokeless Tobacco  . Never Used    Goals Met:  Independence with exercise equipment Exercise tolerated well No report of cardiac concerns or symptoms Strength training completed today  Goals Unmet:  Not Applicable  Comments: Check out 1645.   Dr. Kate Sable is Medical Director for [facility] Cardiac and Pulmonary Rehab.

## 2017-08-05 ENCOUNTER — Encounter (HOSPITAL_COMMUNITY)
Admission: RE | Admit: 2017-08-05 | Discharge: 2017-08-05 | Disposition: A | Discharge status: Home / Self Care | Payer: BLUE CROSS/BLUE SHIELD | Source: Ambulatory Visit | Attending: Cardiology | Admitting: Cardiology

## 2017-08-05 DIAGNOSIS — Z951 Presence of aortocoronary bypass graft: Secondary | ICD-10-CM

## 2017-08-05 NOTE — Progress Notes (Signed)
Daily Session Note  Patient Details  Name: Douglas Cannon MRN: [id number] Date of Birth: [birth year] Referring Provider:     CARDIAC REHAB PHASE II ORIENTATION from 05/19/2017 in [place]  Referring Provider  Dr. Harl Bowie      Encounter Date: 08/05/2017  Check In:     Session Check In - 08/05/17 1541      Check-In   Location [facility]   Staff Present Diane Angelina Pih, MS, EP, [employer], Exercise Physiologist;Johnathon Olden Wynetta Emery, RN, BSN   Supervising physician immediately available to respond to emergencies See telemetry face sheet for immediately available MD   Medication changes reported     No   Fall or balance concerns reported    No   Tobacco Cessation No Change   Warm-up and Cool-down Performed as group-led instruction   Resistance Training Performed Yes   VAD Patient? No     Pain Assessment   Currently in Pain? No/denies   Pain Score 0-No pain   Multiple Pain Sites No      Capillary Blood Glucose: No results found for this or any previous visit (from the past 24 hour(s)).    History  Smoking Status  . Never Smoker  Smokeless Tobacco  . Never Used    Goals Met:  Independence with exercise equipment Exercise tolerated well No report of cardiac concerns or symptoms Strength training completed today  Goals Unmet:  Not Applicable  Comments: Check out 1645.   Dr. Kate Sable is Medical Director for [facility] Cardiac and Pulmonary Rehab.

## 2017-08-08 ENCOUNTER — Encounter (HOSPITAL_COMMUNITY)
Admission: RE | Admit: 2017-08-08 | Discharge: 2017-08-08 | Disposition: A | Discharge status: Home / Self Care | Payer: BLUE CROSS/BLUE SHIELD | Source: Ambulatory Visit | Attending: Cardiology | Admitting: Cardiology

## 2017-08-08 ENCOUNTER — Telehealth: Payer: Self-pay

## 2017-08-08 DIAGNOSIS — Z951 Presence of aortocoronary bypass graft: Secondary | ICD-10-CM | POA: Diagnosis not present

## 2017-08-08 NOTE — Progress Notes (Signed)
Daily Session Note  Patient Details  Name: Douglas Cannon MRN: [id number] Date of Birth: 04-18-1956 Referring Provider:     CARDIAC REHAB PHASE II ORIENTATION from 05/19/2017 in [place]  Referring Provider  Dr. Harl Bowie      Encounter Date: 08/08/2017  Check In:     Session Check In - 08/08/17 1545      Check-In   Location [facility]   Staff Present Diane Angelina Pih, MS, EP, [facility], Exercise Physiologist;Adlean Hardeman Wynetta Emery, RN, BSN   Supervising physician immediately available to respond to emergencies See telemetry face sheet for immediately available MD   Medication changes reported     No   Fall or balance concerns reported    No   Warm-up and Cool-down Performed as group-led instruction   Resistance Training Performed Yes   VAD Patient? No     Pain Assessment   Currently in Pain? No/denies   Pain Score 0-No pain   Multiple Pain Sites No      Capillary Blood Glucose: No results found for this or any previous visit (from the past 24 hour(s)).    History  Smoking Status  . Never Smoker  Smokeless Tobacco  . Never Used    Goals Met:  Independence with exercise equipment Exercise tolerated well No report of cardiac concerns or symptoms Strength training completed today  Goals Unmet:  Not Applicable  Comments: Check out 1645.   Dr. Kate Sable is Medical Director for [facility] Cardiac and Pulmonary Rehab.

## 2017-08-08 NOTE — Telephone Encounter (Signed)
-----   Message from Antoine Poche, MD sent at 08/05/2017  2:55 PM EDT ----- Labs show kidney functinon has decreased since last check. Verify he is taking lasix  daily and change him to just prn swelling. Needs to stay well hydrated, 4-6 bottles of water a day   Dominga Ferry MD

## 2017-08-08 NOTE — Telephone Encounter (Signed)
Pt walked in office and was made aware of lab results.

## 2017-08-10 ENCOUNTER — Encounter (HOSPITAL_COMMUNITY)
Admission: RE | Admit: 2017-08-10 | Discharge: 2017-08-10 | Disposition: A | Discharge status: Home / Self Care | Payer: BLUE CROSS/BLUE SHIELD | Source: Ambulatory Visit | Attending: Cardiology | Admitting: Cardiology

## 2017-08-10 DIAGNOSIS — Z951 Presence of aortocoronary bypass graft: Secondary | ICD-10-CM | POA: Diagnosis not present

## 2017-08-10 NOTE — Progress Notes (Signed)
Daily Session Note  Patient Details  Name: Douglas Cannon MRN: [id number] Date of Birth: [birth year] Referring Provider:     CARDIAC REHAB PHASE II ORIENTATION from 05/19/2017 in [place]  Referring Provider  Dr. Harl Bowie      Encounter Date: 08/10/2017  Check In:     Session Check In - 08/10/17 1545      Check-In   Location [facility]   Staff Present Diane Angelina Pih, MS, EP, [facility], Exercise Physiologist;Riot Waterworth Wynetta Emery, RN, BSN   Supervising physician immediately available to respond to emergencies See telemetry face sheet for immediately available MD   Medication changes reported     No   Fall or balance concerns reported    No   Warm-up and Cool-down Performed as group-led instruction   Resistance Training Performed Yes   VAD Patient? No     Pain Assessment   Currently in Pain? No/denies   Pain Score 0-No pain   Multiple Pain Sites No      Capillary Blood Glucose: No results found for this or any previous visit (from the past 24 hour(s)).    History  Smoking Status  . Never Smoker  Smokeless Tobacco  . Never Used    Goals Met:  Independence with exercise equipment Exercise tolerated well No report of cardiac concerns or symptoms Strength training completed today  Goals Unmet:  Not Applicable  Comments: Check out 1645.   Dr. Kate Sable is Medical Director for [facility] Cardiac and Pulmonary Rehab.

## 2017-08-12 ENCOUNTER — Encounter (HOSPITAL_COMMUNITY)
Admission: RE | Admit: 2017-08-12 | Discharge: 2017-08-12 | Disposition: A | Discharge status: Home / Self Care | Payer: BLUE CROSS/BLUE SHIELD | Source: Ambulatory Visit | Attending: Cardiology | Admitting: Cardiology

## 2017-08-12 VITALS — Ht 70.0 in | Wt 186.1 lb

## 2017-08-12 DIAGNOSIS — Z951 Presence of aortocoronary bypass graft: Secondary | ICD-10-CM

## 2017-08-12 NOTE — Progress Notes (Signed)
Daily Session Note  Patient Details  Name: Douglas Cannon MRN: [id number] Date of Birth: 1956-02-19 Referring Provider:     CARDIAC REHAB PHASE II ORIENTATION from 05/19/2017 in [place]  Referring Provider  Dr. Harl Bowie      Encounter Date: 08/12/2017  Check In:     Session Check In - 08/12/17 1545      Check-In   Location [facility]   Staff Present Aundra Dubin, RN, BSN;Gregory Luther Parody, BS, EP, Exercise Physiologist   Supervising physician immediately available to respond to emergencies See telemetry face sheet for immediately available MD   Medication changes reported     No   Fall or balance concerns reported    No   Warm-up and Cool-down Performed as group-led instruction   Resistance Training Performed Yes   VAD Patient? No     Pain Assessment   Currently in Pain? No/denies   Pain Score 0-No pain   Multiple Pain Sites No      Capillary Blood Glucose: No results found for this or any previous visit (from the past 24 hour(s)).    History  Smoking Status  . Never Smoker  Smokeless Tobacco  . Never Used    Goals Met:  Independence with exercise equipment Exercise tolerated well No report of cardiac concerns or symptoms Strength training completed today  Goals Unmet:  Not Applicable  Comments: Check out 1645.   Dr. Kate Sable is Medical Director for [facility] Cardiac and Pulmonary Rehab.

## 2017-08-19 NOTE — Progress Notes (Signed)
Discharge Progress Report  Patient Details  Name: Douglas Cannon MRN: [id number] Date of Birth: January 11, 1956 Referring Provider:     CARDIAC REHAB PHASE II ORIENTATION from 05/19/2017 in [place]  Referring Provider  Dr. Harl Bowie       Number of Visits: 36  Reason for Discharge:  Patient reached a stable level of exercise. Patient independent in their exercise. Patient has met program and personal goals.  Smoking History:  History  Smoking Status  . Never Smoker  Smokeless Tobacco  . Never Used    Diagnosis:  S/P CABG x 4  ADL UCSD:   Initial Exercise Prescription:     Initial Exercise Prescription - 05/19/17 1400      Date of Initial Exercise RX and Referring Provider   Date 05/19/17   Referring Provider Dr. Harl Bowie     Treadmill   MPH 2   Grade 0   Minutes 15   METs 2.5     Recumbant Elliptical   Level 1   RPM 33   Watts 25   Minutes 20   METs 1.8     Prescription Details   Frequency (times per week) 3   Duration Progress to 30 minutes of continuous aerobic without signs/symptoms of physical distress     Intensity   THRR 40-80% of Max Heartrate [phone]   Ratings of Perceived Exertion 11-13   Perceived Dyspnea 0-4     Progression   Progression Continue progressive overload as per policy without signs/symptoms or physical distress.     Resistance Training   Training Prescription Yes   Weight 1   Reps 10-15      Discharge Exercise Prescription (Final Exercise Prescription Changes):     Exercise Prescription Changes - 08/01/17 1500      Response to Exercise   Blood Pressure (Admit) 124/60   Blood Pressure (Exercise) 120/64   Blood Pressure (Exit) 124/64   Heart Rate (Admit) 58 bpm   Heart Rate (Exercise) 67 bpm   Heart Rate (Exit) 67 bpm   Rating of Perceived Exertion (Exercise) 11   Duration Progress to 30 minutes of  aerobic without signs/symptoms of physical distress   Intensity THRR New  [id number]      Progression   Progression Continue to progress workloads to maintain intensity without signs/symptoms of physical distress.     Resistance Training   Training Prescription Yes   Weight 2   Reps 10-15     Treadmill   MPH 3   Grade 0   Minutes 15   METs 3.4     Recumbant Elliptical   Level 3   RPM 66   Watts 86   Minutes 20   METs 4.8     Home Exercise Plan   Plans to continue exercise at Home (comment)   Frequency Add 2 additional days to program exercise sessions.      Functional Capacity:     6 Minute Walk    Row Name 05/19/17 1454 08/16/17 0833       6 Minute Walk   Phase Initial Discharge    Distance 1250 feet 1500 feet    Distance % Change 0 % 20 %    Distance Feet Change  - 250 ft    Walk Time 6 minutes 6 minutes    # of Rest Breaks 0 0    MPH 2.27 2.84    METS 2.74 3.17    RPE 7 7    Perceived  Dyspnea  9 7    VO2 Peak 11.73 14.81    Symptoms No No    Resting HR 61 bpm 68 bpm    Resting BP 160/82 132/80    Resting Oxygen Saturation   - 95 %    Exercise Oxygen Saturation  during 6 min walk  - 95 %    Max Ex. HR 71 bpm 103 bpm    Max Ex. BP 162/94 164/80    2 Minute Post BP 150/66 160/80       Psychological, QOL, Others - Outcomes: PHQ 2/9: Depression screen [facility] 2/9 08/19/2017 05/19/2017  Decreased Interest 0 0  Down, Depressed, Hopeless 0 1  PHQ - 2 Score 0 1  Altered sleeping 0 0  Tired, decreased energy 0 0  Change in appetite 1 0  Feeling bad or failure about yourself  0 0  Trouble concentrating 0 0  Moving slowly or fidgety/restless 0 0  Suicidal thoughts 0 0  PHQ-9 Score 1 1  Difficult doing work/chores Not difficult at all Not difficult at all    Quality of Life:     Quality of Life - 08/16/17 0835      Quality of Life Scores   Health/Function Pre 27.2 %   Health/Function Post 28.4 %   Health/Function % Change 4.41 %   Socioeconomic Pre 24.75 %   Socioeconomic Post 29.14 %   Socioeconomic % Change  17.74 %   Psych/Spiritual  Pre 25.71 %   Psych/Spiritual Post 29.14 %   Psych/Spiritual % Change 13.34 %   Family Pre 26.4 %   Family Post 27.6 %   Family % Change 4.55 %   GLOBAL Pre 26.23 %   GLOBAL Post 28.59 %   GLOBAL % Change 9 %      Personal Goals: Goals established at orientation with interventions provided to work toward goal.     Personal Goals and Risk Factors at Admission - 05/19/17 1459      Core Components/Risk Factors/Patient Goals on Admission    Weight Management Yes   Intervention Weight Management: Develop a combined nutrition and exercise program designed to reach desired caloric intake, while maintaining appropriate intake of nutrient and fiber, sodium and fats, and appropriate energy expenditure required for the weight goal.;Obesity: Provide education and appropriate resources to help participant work on and attain dietary goals.;Weight Management/Obesity: Establish reasonable short term and long term weight goals.;Weight Management: Provide education and appropriate resources to help participant work on and attain dietary goals.   Admit Weight 176 lb 11.2 oz (80.2 kg)   Goal Weight: Short Term 171 lb 11.2 oz (77.9 kg)   Goal Weight: Long Term 166 lb 11.2 oz (75.6 kg)   Expected Outcomes Short Term: Continue to assess and modify interventions until short term weight is achieved;Long Term: Adherence to nutrition and physical activity/exercise program aimed toward attainment of established weight goal;Weight Gain: Understanding of general recommendations for a high calorie, high protein meal plan that promotes weight gain by distributing calorie intake throughout the day with the consumption for 4-5 meals, snacks, and/or supplements   Diabetes Yes   Intervention Provide education about signs/symptoms and action to take for hypo/hyperglycemia.;Provide education about proper nutrition, including hydration, and aerobic/resistive exercise prescription along with prescribed medications to achieve  blood glucose in normal ranges: Fasting glucose 65-99 mg/dL   Expected Outcomes Long Term: Attainment of HbA1C < 7%.   Hypertension Yes   Intervention Provide education on lifestyle modifcations including regular  physical activity/exercise, weight management, moderate sodium restriction and increased consumption of fresh fruit, vegetables, and low fat dairy, alcohol moderation, and smoking cessation.   Expected Outcomes Short Term: Continued assessment and intervention until BP is < 140/9m HG in hypertensive participants. < 130/841mHG in hypertensive participants with diabetes, heart failure or chronic kidney disease.;Long Term: Maintenance of blood pressure at goal levels.   Personal Goal Other Yes   Personal Goal Lose 5-10 lbs. Gain muscle tone; increaase strength.   Intervention Patient will attend CR 3 days/week and supplement with exersice at home 2 days/week.   Expected Outcomes Patient will meet his personal goals.        Personal Goals Discharge:     Goals and Risk Factor Review    Row Name 06/20/17 1438 07/20/17 1413 08/19/17 1417         Core Components/Risk Factors/Patient Goals Review   Personal Goals Review Weight Management/Obesity;Diabetes  Lose 5-10 lbs; gain muscle tone and strength. Weight Management/Obesity  Lose 5-10 lbs; gain muscle tone; increase strength. Weight Management/Obesity  Lose 5-10 lbs; gain muscle tone; increase strength.      Review Patient has completed 13 sessions gaining 6.6 lbs. He is doing well in the program. His last A1C was last week was 6.0. He is working full time without difficulty. He says he feels stronger and better. Will continue to monitor.  Patient has completed 25 sessions gaining 8.3 lbs. He continues to do well in the program. He says he feels better overall and says the program is helping him meet his goals. He feels like he is gaining strength. Will continue to monitor.  Patient graduated with 36 sessions gaining 11.8 lbs. He blames  his weight gain on increased stress secondary to his job. He did well in the program. His exit measurements improved in grip strength, balance, and flexibility. His exit walk test improved by 20%. Patient says he learned a lot in the program and feels better overall since he started. He says he feels stronger and feels like he met his goals except his weight loss goal.  He plans to join [employer] continue exercising.      Expected Outcomes Patient will complete the program and meet his personal goals.  Patient will complete the program meeting his personal goals.  Patient will continue exercising and continue to meet his goals and work on stress management to start meeting his weight loss goal. CR will f/u for 1 year.         Exercise Goals and Review:     Exercise Goals    Row Name 05/19/17 1445             Exercise Goals   Increase Physical Activity Yes       Intervention Provide advice, education, support and counseling about physical activity/exercise needs.;Develop an individualized exercise prescription for aerobic and resistive training based on initial evaluation findings, risk stratification, comorbidities and participant's personal goals.       Expected Outcomes Achievement of increased cardiorespiratory fitness and enhanced flexibility, muscular endurance and strength shown through measurements of functional capacity and personal statement of participant.       Increase Strength and Stamina Yes       Intervention Provide advice, education, support and counseling about physical activity/exercise needs.;Develop an individualized exercise prescription for aerobic and resistive training based on initial evaluation findings, risk stratification, comorbidities and participant's personal goals.       Expected Outcomes Achievement of increased cardiorespiratory  fitness and enhanced flexibility, muscular endurance and strength shown through measurements of functional capacity and  personal statement of participant.          Nutrition & Weight - Outcomes:     Pre Biometrics - 05/19/17 1457      Pre Biometrics   Height '5\' 10"'  (1.778 m)   Weight 176 lb 9.4 oz (80.1 kg)   Waist Circumference 38.5 inches   Hip Circumference 35.5 inches   Waist to Hip Ratio 1.08 %   BMI (Calculated) 25.4   Triceps Skinfold 5 mm   % Body Fat 21.2 %   Grip Strength 53.4 kg   Flexibility 10.75 in   Single Leg Stand 4 seconds         Post Biometrics - 08/16/17 0834       Post  Biometrics   Height '5\' 10"'  (1.778 m)   Weight 186 lb 1.1 oz (84.4 kg)   Waist Circumference 40 inches   Hip Circumference 35 inches   Waist to Hip Ratio 1.14 %   BMI (Calculated) 26.7   Triceps Skinfold 5 mm   % Body Fat 22.3 %   Grip Strength 56.93 kg   Flexibility 11.33 in   Single Leg Stand 8 seconds      Nutrition:     Nutrition Therapy & Goals - 07/20/17 1418      Nutrition Therapy   RD appointment defered Yes      Nutrition Discharge:     Nutrition Assessments - 08/19/17 1415      MEDFICTS Scores   Pre Score 65   Post Score 50   Score Difference -15      Education Questionnaire Score:     Knowledge Questionnaire Score - 08/19/17 1415      Knowledge Questionnaire Score   Pre Score 22/24   Post Score 22/24      Goals reviewed with patient; copy given to patient.

## 2017-08-19 NOTE — Progress Notes (Signed)
Cardiac Individual Treatment Plan  Patient Details  Name: Douglas Cannon MRN: [id number] Date of Birth: 09/01/1956 Referring Provider:     CARDIAC REHAB PHASE II ORIENTATION from 05/19/2017 in [facility]  Referring Provider  Dr. Harl Bowie      Initial Encounter Date:    CARDIAC REHAB PHASE II ORIENTATION from 05/19/2017 in [facility]  Date  05/19/17  Referring Provider  Dr. Harl Bowie      Visit Diagnosis: S/P CABG x 4  Patient's Home Medications on Admission:  Current Outpatient Prescriptions:  .  amLODipine (NORVASC) 10 MG tablet, Take 1 tablet (10 mg total) by mouth daily., Disp: 90 tablet, Rfl: 3 .  aspirin EC 81 MG tablet, Take 81 mg by mouth daily., Disp: , Rfl:  .  carvedilol (COREG) 25 MG tablet, Take 1 tablet (25 mg total) by mouth 2 (two) times daily., Disp: 180 tablet, Rfl: 3 .  citalopram (CELEXA) 20 MG tablet, Take 20 mg by mouth daily., Disp: , Rfl:  .  clopidogrel (PLAVIX) 75 MG tablet, Take 1 tablet (75 mg total) by mouth daily., Disp: 90 tablet, Rfl: 3 .  Cyanocobalamin (B-12 PO), Take 1 tablet by mouth daily., Disp: , Rfl:  .  DM-Phenylephrine-Acetaminophen (ALKA-SELTZER PLS SINUS & COUGH PO), Take 1 tablet by mouth daily as needed., Disp: , Rfl:  .  furosemide (LASIX) 40 MG tablet, Take 1 tablet (40 mg total) by mouth daily., Disp: 90 tablet, Rfl: 3 .  glimepiride (AMARYL) 4 MG tablet, Take 4 mg by mouth daily with breakfast., Disp: , Rfl:  .  metFORMIN (GLUCOPHAGE) 500 MG tablet, Take by mouth 2 (two) times daily with a meal., Disp: , Rfl:  .  rosuvastatin (CRESTOR) 5 MG tablet, Take 1 tablet (5 mg total) by mouth at bedtime., Disp: 90 tablet, Rfl: 3 .  triamcinolone ointment (KENALOG) 0.1 %, Apply 1 application topically 2 (two) times daily., Disp: , Rfl:  .  Zinc 50 MG TABS, Take 1 tablet by mouth daily., Disp: , Rfl:   Past Medical History: Past Medical History:  Diagnosis Date  . Anemia   . Anxiety   . CKD (chronic  kidney disease) stage 3, GFR 30-59 ml/min   . Diastolic heart failure [facility])    Episode December 2017 in the setting of pneumonia  . Essential hypertension   . History of pneumonia   . Hyperlipemia   . Osteomyelitis ([place])   . Type 2 diabetes mellitus (HCC)     Tobacco Use: History  Smoking Status  . Never Smoker  Smokeless Tobacco  . Never Used    Labs: Recent Review Flowsheet Data    Labs for ITP Cardiac and Pulmonary Rehab Latest Ref Rng & Units 03/23/2017 03/23/2017 03/23/2017 03/23/2017 08/02/2017   Cholestrol 0 - 200 mg/dL - - - - 123   LDLCALC 0 - 99 mg/dL - - - - 68   HDL >40 mg/dL - - - - 43   Trlycerides <150 mg/dL - - - - 59   Hemoglobin A1c 4.8 - 5.6 % - - - - -   PHART 7.350 - 7.450 7.381 - 7.423 - -   PCO2ART 32.0 - 48.0 mmHg 38.5 - 34.8 - -   HCO3 20.0 - 28.0 mmol/L 22.7 - 22.7 - -   TCO2 0 - 100 mmol/L 24 - 24 23 -   ACIDBASEDEF 0.0 - 2.0 mmol/L 2.0 - 1.0 - -   O2SAT % 95.0 71.6 93.0 - -  Capillary Blood Glucose: Lab Results  Component Value Date   GLUCAP 170 (H) 03/27/2017   GLUCAP 127 (H) 03/27/2017   GLUCAP 147 (H) 03/26/2017   GLUCAP 67 03/26/2017   GLUCAP 170 (H) 03/26/2017     Exercise Target Goals:    Exercise Program Goal: Individual exercise prescription set with THRR, safety & activity barriers. Participant demonstrates ability to understand and report RPE using BORG scale, to self-measure pulse accurately, and to acknowledge the importance of the exercise prescription.  Exercise Prescription Goal: Starting with aerobic activity 30 plus minutes a day, 3 days per week for initial exercise prescription. Provide home exercise prescription and guidelines that participant acknowledges understanding prior to discharge.  Activity Barriers & Risk Stratification:     Activity Barriers & Cardiac Risk Stratification - 05/19/17 1444      Activity Barriers & Cardiac Risk Stratification   Activity Barriers None   Cardiac Risk Stratification High       6 Minute Walk:     6 Minute Walk    Row Name 05/19/17 1454 08/16/17 0833       6 Minute Walk   Phase Initial Discharge    Distance 1250 feet 1500 feet    Distance % Change 0 % 20 %    Distance Feet Change  - 250 ft    Walk Time 6 minutes 6 minutes    # of Rest Breaks 0 0    MPH 2.27 2.84    METS 2.74 3.17    RPE 7 7    Perceived Dyspnea  9 7    VO2 Peak 11.73 14.81    Symptoms No No    Resting HR 61 bpm 68 bpm    Resting BP 160/82 132/80    Resting Oxygen Saturation   - 95 %    Exercise Oxygen Saturation  during 6 min walk  - 95 %    Max Ex. HR 71 bpm 103 bpm    Max Ex. BP 162/94 164/80    2 Minute Post BP 150/66 160/80       Oxygen Initial Assessment:   Oxygen Re-Evaluation:   Oxygen Discharge (Final Oxygen Re-Evaluation):   Initial Exercise Prescription:     Initial Exercise Prescription - 05/19/17 1400      Date of Initial Exercise RX and Referring Provider   Date 05/19/17   Referring Provider Dr. Harl Bowie     Treadmill   MPH 2   Grade 0   Minutes 15   METs 2.5     Recumbant Elliptical   Level 1   RPM 33   Watts 25   Minutes 20   METs 1.8     Prescription Details   Frequency (times per week) 3   Duration Progress to 30 minutes of continuous aerobic without signs/symptoms of physical distress     Intensity   THRR 40-80% of Max Heartrate [phone]   Ratings of Perceived Exertion 11-13   Perceived Dyspnea 0-4     Progression   Progression Continue progressive overload as per policy without signs/symptoms or physical distress.     Resistance Training   Training Prescription Yes   Weight 1   Reps 10-15      Perform Capillary Blood Glucose checks as needed.  Exercise Prescription Changes:      Exercise Prescription Changes    Row Name 06/06/17 1400 06/28/17 1000 07/13/17 1400 08/01/17 1500       Response to Exercise   Blood Pressure (Admit)  140/70 132/60 130/68 124/60    Blood Pressure (Exercise) 120/60 124/72 140/70  120/64    Blood Pressure (Exit) 150/80 120/64 130/60 124/64    Heart Rate (Admit) 61 bpm 65 bpm 63 bpm 58 bpm    Heart Rate (Exercise) 94 bpm 93 bpm 85 bpm 67 bpm    Heart Rate (Exit) 70 bpm 77 bpm 72 bpm 67 bpm    Rating of Perceived Exertion (Exercise) '8 8 8 11    ' Duration Progress to 30 minutes of  aerobic without signs/symptoms of physical distress Progress to 30 minutes of  aerobic without signs/symptoms of physical distress Progress to 30 minutes of  aerobic without signs/symptoms of physical distress Progress to 30 minutes of  aerobic without signs/symptoms of physical distress    Intensity THRR unchanged THRR unchanged THRR unchanged THRR New  [phone]      Progression   Progression Continue to progress workloads to maintain intensity without signs/symptoms of physical distress. Continue to progress workloads to maintain intensity without signs/symptoms of physical distress. Continue to progress workloads to maintain intensity without signs/symptoms of physical distress. Continue to progress workloads to maintain intensity without signs/symptoms of physical distress.      Resistance Training   Training Prescription Yes Yes Yes Yes    Weight '2 2 2 2    ' Reps 10-15 10-15 10-15 10-15      Treadmill   MPH 2.5 2.7 2.9 3    Grade 0 0 0 0    Minutes '15 15 15 15    ' METs 2.9 3 3.22 3.4      Recumbant Elliptical   Level '1 2 2 3    ' RPM 68 91 64 66    Watts 90 68 93 86    Minutes '20 20 20 20    ' METs 5.1 5.3 5.1 4.8      Home Exercise Plan   Plans to continue exercise at Home (comment) Home (comment) Home (comment) Home (comment)    Frequency Add 2 additional days to program exercise sessions. Add 2 additional days to program exercise sessions. Add 2 additional days to program exercise sessions. Add 2 additional days to program exercise sessions.       Exercise Comments:      Exercise Comments    Row Name 06/06/17 1454 06/28/17 1014 07/13/17 1429 08/01/17 1504     Exercise  Comments Patient is doing well in CR.  Patient is doing well in CR.  Patient is doing well in CR.  Patient is doing well in CR.        Exercise Goals and Review:      Exercise Goals    Row Name 05/19/17 1445             Exercise Goals   Increase Physical Activity Yes       Intervention Provide advice, education, support and counseling about physical activity/exercise needs.;Develop an individualized exercise prescription for aerobic and resistive training based on initial evaluation findings, risk stratification, comorbidities and participant's personal goals.       Expected Outcomes Achievement of increased cardiorespiratory fitness and enhanced flexibility, muscular endurance and strength shown through measurements of functional capacity and personal statement of participant.       Increase Strength and Stamina Yes       Intervention Provide advice, education, support and counseling about physical activity/exercise needs.;Develop an individualized exercise prescription for aerobic and resistive training based on initial evaluation findings, risk stratification, comorbidities and participant's personal goals.  Expected Outcomes Achievement of increased cardiorespiratory fitness and enhanced flexibility, muscular endurance and strength shown through measurements of functional capacity and personal statement of participant.          Exercise Goals Re-Evaluation :     Exercise Goals Re-Evaluation    Row Name 06/20/17 1442 07/20/17 1418 08/01/17 1504         Exercise Goal Re-Evaluation   Exercise Goals Review Increase Physical Activity;Increase Strenth and Stamina Increase Physical Activity;Increase Strength and Stamina Increase Physical Activity;Increase Strength and Stamina;Knowledge and understanding of Target Heart Rate Range (THRR)     Comments Patient has completed 13 sessions. He is progressing well in the program with increased speed on treadmill and increased weights and  resistance bands. He says he is feeling stronger. Will continue to monitor.  Patient's mets are increasing. He continues to progress in the program and is getting stronger. Will continue to monitor.  Patient is doing well in CR.      Expected Outcomes Patient will complete the program with continued icreased strength, stamina, and activity. Patient will complete the program with continued increased strength, stamina, and activity. Patient wishes to return to ADLs          Discharge Exercise Prescription (Final Exercise Prescription Changes):     Exercise Prescription Changes - 08/01/17 1500      Response to Exercise   Blood Pressure (Admit) 124/60   Blood Pressure (Exercise) 120/64   Blood Pressure (Exit) 124/64   Heart Rate (Admit) 58 bpm   Heart Rate (Exercise) 67 bpm   Heart Rate (Exit) 67 bpm   Rating of Perceived Exertion (Exercise) 11   Duration Progress to 30 minutes of  aerobic without signs/symptoms of physical distress   Intensity THRR New  [phone]     Progression   Progression Continue to progress workloads to maintain intensity without signs/symptoms of physical distress.     Resistance Training   Training Prescription Yes   Weight 2   Reps 10-15     Treadmill   MPH 3   Grade 0   Minutes 15   METs 3.4     Recumbant Elliptical   Level 3   RPM 66   Watts 86   Minutes 20   METs 4.8     Home Exercise Plan   Plans to continue exercise at Home (comment)   Frequency Add 2 additional days to program exercise sessions.      Nutrition:  Target Goals: Understanding of nutrition guidelines, daily intake of sodium <15106m, cholesterol <2029m calories 30% from fat and 7% or less from saturated fats, daily to have 5 or more servings of fruits and vegetables.  Biometrics:     Pre Biometrics - 05/19/17 1457      Pre Biometrics   Height '5\' 10"'  (1.778 m)   Weight 176 lb 9.4 oz (80.1 kg)   Waist Circumference 38.5 inches   Hip Circumference 35.5 inches    Waist to Hip Ratio 1.08 %   BMI (Calculated) 25.4   Triceps Skinfold 5 mm   % Body Fat 21.2 %   Grip Strength 53.4 kg   Flexibility 10.75 in   Single Leg Stand 4 seconds         Post Biometrics - 08/16/17 0834       Post  Biometrics   Height '5\' 10"'  (1.778 m)   Weight 186 lb 1.1 oz (84.4 kg)   Waist Circumference 40 inches   Hip Circumference 35 inches  Waist to Hip Ratio 1.14 %   BMI (Calculated) 26.7   Triceps Skinfold 5 mm   % Body Fat 22.3 %   Grip Strength 56.93 kg   Flexibility 11.33 in   Single Leg Stand 8 seconds      Nutrition Therapy Plan and Nutrition Goals:     Nutrition Therapy & Goals - 07/20/17 1418      Nutrition Therapy   RD appointment defered Yes      Nutrition Discharge: Rate Your Plate Scores:     Nutrition Assessments - 08/19/17 1415      MEDFICTS Scores   Pre Score 65   Post Score 50   Score Difference -15      Nutrition Goals Re-Evaluation:   Nutrition Goals Discharge (Final Nutrition Goals Re-Evaluation):   Psychosocial: Target Goals: Acknowledge presence or absence of significant depression and/or stress, maximize coping skills, provide positive support system. Participant is able to verbalize types and ability to use techniques and skills needed for reducing stress and depression.  Initial Review & Psychosocial Screening:     Initial Psych Review & Screening - 05/19/17 1517      Initial Review   Current issues with None Identified     Family Dynamics   Good Support System? Yes     Barriers   Psychosocial barriers to participate in program There are no identifiable barriers or psychosocial needs.     Screening Interventions   Interventions Encouraged to exercise      Quality of Life Scores:     Quality of Life - 08/16/17 0835      Quality of Life Scores   Health/Function Pre 27.2 %   Health/Function Post 28.4 %   Health/Function % Change 4.41 %   Socioeconomic Pre 24.75 %   Socioeconomic Post 29.14 %    Socioeconomic % Change  17.74 %   Psych/Spiritual Pre 25.71 %   Psych/Spiritual Post 29.14 %   Psych/Spiritual % Change 13.34 %   Family Pre 26.4 %   Family Post 27.6 %   Family % Change 4.55 %   GLOBAL Pre 26.23 %   GLOBAL Post 28.59 %   GLOBAL % Change 9 %      PHQ-9: Recent Review Flowsheet Data    Depression screen [facility] 2/9 08/19/2017 05/19/2017   Decreased Interest 0 0   Down, Depressed, Hopeless 0 1   PHQ - 2 Score 0 1   Altered sleeping 0 0   Tired, decreased energy 0 0   Change in appetite 1 0   Feeling bad or failure about yourself  0 0   Trouble concentrating 0 0   Moving slowly or fidgety/restless 0 0   Suicidal thoughts 0 0   PHQ-9 Score 1 1   Difficult doing work/chores Not difficult at all Not difficult at all     Interpretation of Total Score  Total Score Depression Severity:  1-4 = Minimal depression, 5-9 = Mild depression, 10-14 = Moderate depression, 15-19 = Moderately severe depression, 20-27 = Severe depression   Psychosocial Evaluation and Intervention:     Psychosocial Evaluation - 08/19/17 1423      Discharge Psychosocial Assessment & Intervention   Comments Patient has no psychosocial issues identified at discharge. His exit QOL score increased by 4.55% at 28.59. His PHQ-9 score remained the same at 1.       Psychosocial Re-Evaluation:     Psychosocial Re-Evaluation    Row Name 06/20/17 1443 07/20/17 1419  Psychosocial Re-Evaluation   Current issues with None Identified None Identified      Expected Outcomes Patient will have no psychosocial barriers identified at discharge.  Patient will have no psychosocial issues identified at discharge.       Interventions Encouraged to attend Cardiac Rehabilitation for the exercise;Stress management education;Relaxation education Encouraged to attend Cardiac Rehabilitation for the exercise      Continue Psychosocial Services  No Follow up required No Follow up required         Psychosocial  Discharge (Final Psychosocial Re-Evaluation):     Psychosocial Re-Evaluation - 07/20/17 1419      Psychosocial Re-Evaluation   Current issues with None Identified   Expected Outcomes Patient will have no psychosocial issues identified at discharge.    Interventions Encouraged to attend Cardiac Rehabilitation for the exercise   Continue Psychosocial Services  No Follow up required      Vocational Rehabilitation: Provide vocational rehab assistance to qualifying candidates.   Vocational Rehab Evaluation & Intervention:     Vocational Rehab - 05/19/17 1443      Initial Vocational Rehab Evaluation & Intervention   Assessment shows need for Vocational Rehabilitation No      Education: Education Goals: Education classes will be provided on a weekly basis, covering required topics. Participant will state understanding/return demonstration of topics presented.  Learning Barriers/Preferences:     Learning Barriers/Preferences - 05/19/17 1442      Learning Barriers/Preferences   Learning Barriers None   Learning Preferences Video;Pictoral;Computer/Internet;Written Material      Education Topics: Hypertension, Hypertension Reduction -Define heart disease and high blood pressure. Discus how high blood pressure affects the body and ways to reduce high blood pressure.   CARDIAC REHAB PHASE II EXERCISE from 08/10/2017 in [place]  Date  06/15/17  Educator  DC      Exercise and Your Heart -Discuss why it is important to exercise, the FITT principles of exercise, normal and abnormal responses to exercise, and how to exercise safely.   CARDIAC REHAB PHASE II EXERCISE from 08/10/2017 in [place]  Date  06/22/17  Educator  DC      Angina -Discuss definition of angina, causes of angina, treatment of angina, and how to decrease risk of having angina.   CARDIAC REHAB PHASE II EXERCISE from 08/10/2017 in [place]  Date  06/29/17  Educator  DC      Cardiac Medications -Review what the following cardiac medications are used for, how they affect the body, and side effects that may occur when taking the medications.  Medications include Aspirin, Beta blockers, calcium channel blockers, ACE Inhibitors, angiotensin receptor blockers, diuretics, digoxin, and antihyperlipidemics.   CARDIAC REHAB PHASE II EXERCISE from 08/10/2017 in [place]  Date  07/06/17  Educator  Etheleen Mayhew  Instruction Review Code  2- meets goals/outcomes      Congestive Heart Failure -Discuss the definition of CHF, how to live with CHF, the signs and symptoms of CHF, and how keep track of weight and sodium intake.   CARDIAC REHAB PHASE II EXERCISE from 08/10/2017 in [place]  Date  07/13/17  Educator  DC  Instruction Review Code  2- Demonstrated Understanding      Heart Disease and Intimacy -Discus the effect sexual activity has on the heart, how changes occur during intimacy as we age, and safety during sexual activity.   CARDIAC REHAB PHASE II EXERCISE from 08/10/2017 in [place]  [place]  Date  07/20/17  Educator  DC  Instruction Review Code  2- Demonstrated Understanding      Smoking Cessation / COPD -Discuss different methods to quit smoking, the health benefits of quitting smoking, and the definition of COPD.   CARDIAC REHAB PHASE II EXERCISE from 08/10/2017 in [place]  Date  07/27/17  Educator  DC  Instruction Review Code  2- Demonstrated Understanding      Nutrition I: Fats -Discuss the types of cholesterol, what cholesterol does to the heart, and how cholesterol levels can be controlled.   CARDIAC REHAB PHASE II EXERCISE from 08/10/2017 in [place]  Date  08/03/17  Educator  DC  Instruction Review Code  2- Demonstrated Understanding      Nutrition II: Labels -Discuss the  different components of food labels and how to read food label   CARDIAC REHAB PHASE II EXERCISE from 08/10/2017 in [place]  Date  08/10/17  Educator  DC  Instruction Review Code  2- Demonstrated Understanding      Heart Parts and Heart Disease -Discuss the anatomy of the heart, the pathway of blood circulation through the heart, and these are affected by heart disease.   Stress I: Signs and Symptoms -Discuss the causes of stress, how stress may lead to anxiety and depression, and ways to limit stress.   CARDIAC REHAB PHASE II EXERCISE from 08/10/2017 in [place]  Date  05/25/17  Educator  GC      Stress II: Relaxation -Discuss different types of relaxation techniques to limit stress.   CARDIAC REHAB PHASE II EXERCISE from 08/10/2017 in [place]  Date  06/01/17  Educator  GC      Warning Signs of Stroke / TIA -Discuss definition of a stroke, what the signs and symptoms are of a stroke, and how to identify when someone is having stroke.   CARDIAC REHAB PHASE II EXERCISE from 08/10/2017 in [place]  Date  06/08/17  Educator  GC      Knowledge Questionnaire Score:     Knowledge Questionnaire Score - 08/19/17 1415      Knowledge Questionnaire Score   Pre Score 22/24   Post Score 22/24      Core Components/Risk Factors/Patient Goals at Admission:     Personal Goals and Risk Factors at Admission - 05/19/17 1459      Core Components/Risk Factors/Patient Goals on Admission    Weight Management Yes   Intervention Weight Management: Develop a combined nutrition and exercise program designed to reach desired caloric intake, while maintaining appropriate intake of nutrient and fiber, sodium and fats, and appropriate energy expenditure required for the weight goal.;Obesity: Provide education and appropriate resources to help participant work on and attain dietary goals.;Weight  Management/Obesity: Establish reasonable short term and long term weight goals.;Weight Management: Provide education and appropriate resources to help participant work on and attain dietary goals.   Admit Weight 176 lb 11.2 oz (80.2 kg)   Goal Weight: Short Term 171 lb 11.2 oz (77.9 kg)   Goal Weight: Long Term 166 lb 11.2 oz (75.6 kg)   Expected Outcomes Short Term: Continue to assess and modify interventions until short term weight is achieved;Long Term: Adherence to nutrition and physical activity/exercise program aimed toward attainment of established weight goal;Weight Gain: Understanding of general recommendations for a high calorie, high protein meal plan that promotes weight gain by distributing calorie intake  throughout the day with the consumption for 4-5 meals, snacks, and/or supplements   Diabetes Yes   Intervention Provide education about signs/symptoms and action to take for hypo/hyperglycemia.;Provide education about proper nutrition, including hydration, and aerobic/resistive exercise prescription along with prescribed medications to achieve blood glucose in normal ranges: Fasting glucose 65-99 mg/dL   Expected Outcomes Long Term: Attainment of HbA1C < 7%.   Hypertension Yes   Intervention Provide education on lifestyle modifcations including regular physical activity/exercise, weight management, moderate sodium restriction and increased consumption of fresh fruit, vegetables, and low fat dairy, alcohol moderation, and smoking cessation.   Expected Outcomes Short Term: Continued assessment and intervention until BP is < 140/30m HG in hypertensive participants. < 130/863mHG in hypertensive participants with diabetes, heart failure or chronic kidney disease.;Long Term: Maintenance of blood pressure at goal levels.   Personal Goal Other Yes   Personal Goal Lose 5-10 lbs. Gain muscle tone; increaase strength.   Intervention Patient will attend CR 3 days/week and supplement with exersice at  home 2 days/week.   Expected Outcomes Patient will meet his personal goals.       Core Components/Risk Factors/Patient Goals Review:      Goals and Risk Factor Review    Row Name 06/20/17 1438 07/20/17 1413 08/19/17 1417         Core Components/Risk Factors/Patient Goals Review   Personal Goals Review Weight Management/Obesity;Diabetes  Lose 5-10 lbs; gain muscle tone and strength. Weight Management/Obesity  Lose 5-10 lbs; gain muscle tone; increase strength. Weight Management/Obesity  Lose 5-10 lbs; gain muscle tone; increase strength.      Review Patient has completed 13 sessions gaining 6.6 lbs. He is doing well in the program. His last A1C was last week was 6.0. He is working full time without difficulty. He says he feels stronger and better. Will continue to monitor.  Patient has completed 25 sessions gaining 8.3 lbs. He continues to do well in the program. He says he feels better overall and says the program is helping him meet his goals. He feels like he is gaining strength. Will continue to monitor.  Patient graduated with 36 sessions gaining 11.8 lbs. He blames his weight gain on increased stress secondary to his job. He did well in the program. His exit measurements improved in grip strength, balance, and flexibility. His exit walk test improved by 20%. Patient says he learned a lot in the program and feels better overall since he started. He says he feels stronger and feels like he met his goals except his weight loss goal.  He plans to join [employer] continue exercising.      Expected Outcomes Patient will complete the program and meet his personal goals.  Patient will complete the program meeting his personal goals.  Patient will continue exercising and continue to meet his goals and work on stress management to start meeting his weight loss goal. CR will f/u for 1 year.         Core Components/Risk Factors/Patient Goals at Discharge (Final Review):      Goals and Risk  Factor Review - 08/19/17 1417      Core Components/Risk Factors/Patient Goals Review   Personal Goals Review Weight Management/Obesity  Lose 5-10 lbs; gain muscle tone; increase strength.    Review Patient graduated with 36 sessions gaining 11.8 lbs. He blames his weight gain on increased stress secondary to his job. He did well in the program. His exit measurements improved in grip strength, balance,  and flexibility. His exit walk test improved by 20%. Patient says he learned a lot in the program and feels better overall since he started. He says he feels stronger and feels like he met his goals except his weight loss goal.  He plans to join [employer] to continue exercising.    Expected Outcomes Patient will continue exercising and continue to meet his goals and work on stress management to start meeting his weight loss goal. CR will f/u for 1 year.       ITP Comments:     ITP Comments    Row Name 05/19/17 1444 05/23/17 0745         ITP Comments Douglas Cannon is a pleasant 61 year old male. He has returned to work.  Patient new to program. Plans to start today 05/23/17.         Comments: Patient graduated from [place] today on 08/12/17 after completing 36 sessions. He achieved LTG of 30 minutes of aerobic exercise at Max Met level of 5.5. All patients vitals are WNL. Patient did not meet with dietician. Discharge instruction has been reviewed in detail and patient stated an understanding of material given. Patient plans to continue exercising at [facility]. Cardiac Rehab staff will make f/u calls at 1 month, 6 months, and 1 year. Patient had no complaints of any abnormal S/S or pain on their exit visit.

## 2017-09-13 ENCOUNTER — Encounter: Payer: Self-pay | Admitting: Cardiology

## 2017-09-13 ENCOUNTER — Ambulatory Visit (HOSPITAL_COMMUNITY)
Admission: RE | Admit: 2017-09-13 | Discharge: 2017-09-13 | Disposition: A | Discharge status: Home / Self Care | Payer: BLUE CROSS/BLUE SHIELD | Source: Ambulatory Visit | Attending: Cardiology | Admitting: Cardiology

## 2017-09-13 ENCOUNTER — Ambulatory Visit (INDEPENDENT_AMBULATORY_CARE_PROVIDER_SITE_OTHER): Payer: BLUE CROSS/BLUE SHIELD | Admitting: Cardiology

## 2017-09-13 ENCOUNTER — Other Ambulatory Visit (HOSPITAL_COMMUNITY)
Admission: RE | Admit: 2017-09-13 | Discharge: 2017-09-13 | Disposition: A | Discharge status: Home / Self Care | Payer: BLUE CROSS/BLUE SHIELD | Source: Ambulatory Visit | Attending: Cardiology | Admitting: Cardiology

## 2017-09-13 VITALS — BP 178/90 | HR 67 | Ht 70.0 in | Wt 184.0 lb

## 2017-09-13 DIAGNOSIS — I2510 Atherosclerotic heart disease of native coronary artery without angina pectoris: Secondary | ICD-10-CM

## 2017-09-13 DIAGNOSIS — Z23 Encounter for immunization: Secondary | ICD-10-CM

## 2017-09-13 DIAGNOSIS — N183 Chronic kidney disease, stage 3 unspecified: Secondary | ICD-10-CM

## 2017-09-13 DIAGNOSIS — I5030 Unspecified diastolic (congestive) heart failure: Secondary | ICD-10-CM | POA: Diagnosis not present

## 2017-09-13 DIAGNOSIS — I5022 Chronic systolic (congestive) heart failure: Secondary | ICD-10-CM | POA: Diagnosis not present

## 2017-09-13 DIAGNOSIS — I420 Dilated cardiomyopathy: Secondary | ICD-10-CM | POA: Insufficient documentation

## 2017-09-13 DIAGNOSIS — I051 Rheumatic mitral insufficiency: Secondary | ICD-10-CM | POA: Insufficient documentation

## 2017-09-13 LAB — BASIC METABOLIC PANEL
Anion gap: 9 (ref 5–15)
BUN: 34 mg/dL — ABNORMAL HIGH (ref 6–20)
CO2: 25 mmol/L (ref 22–32)
Calcium: 8.9 mg/dL (ref 8.9–10.3)
Chloride: 106 mmol/L (ref 101–111)
Creatinine, Ser: 2.07 mg/dL — ABNORMAL HIGH (ref 0.61–1.24)
GFR calc Af Amer: 38 mL/min — ABNORMAL LOW (ref >60)
GFR calc non Af Amer: 33 mL/min — ABNORMAL LOW (ref >60)
Glucose, Bld: 57 mg/dL — ABNORMAL LOW (ref 65–99)
Potassium: 4.7 mmol/L (ref 3.5–5.1)
Sodium: 140 mmol/L (ref 135–145)

## 2017-09-13 LAB — ECHOCARDIOGRAM COMPLETE
Height: 70 in
Weight: 2944 oz

## 2017-09-13 LAB — MAGNESIUM: Magnesium: 2.1 mg/dL (ref 1.7–2.4)

## 2017-09-13 NOTE — Progress Notes (Signed)
*  PRELIMINARY RESULTS* Echocardiogram 2D Echocardiogram has been performed.  Douglas Cannon, Douglas Cannon 09/13/2017, 3:23 PM

## 2017-09-13 NOTE — Patient Instructions (Signed)
Medication Instructions:  Your physician recommends that you continue on your current medications as directed. Please refer to the Current Medication list given to you today.   Labwork: ASAP  BMET MAGNESIUM  Testing/Procedures: Your physician has requested that you have an echocardiogram. Echocardiography is a painless test that uses sound waves to create images of your heart. It provides your doctor with information about the size and shape of your heart and how well your heart's chambers and valves are working. This procedure takes approximately one hour. There are no restrictions for this procedure.    Follow-Up: Your physician recommends that you schedule a follow-up appointment in: PENDING ECHO TEST RESULTS    Any Other Special Instructions Will Be Listed Below (If Applicable). Your physician has requested that you regularly monitor and record your blood pressure readings at home. Please use the same machine at the same time of day to check your readings and record them to bring to your follow-up visit. (1 WEEK)     If you need a refill on your cardiac medications before your next appointment, please call your pharmacy.

## 2017-09-13 NOTE — Progress Notes (Signed)
Clinical Summary [name] is a 61 y.o.male  seen today for follow up of the following medical problems.    1. CAD - admit with NSTEMI, 03/2017 cath showed multivessel CAD and referred for CABG - echo 02/2017 LVEF 30-35%, abnormal diastolic function, mild AI, mild MR - 03/22/17 4 vessel CABG (LIMA-LAD, SVG-Diag, SVG to OM1, SVG-OM2) - started on plavix due to use of cryo veins during surgery and presenting with NSTEMI   - no recent chest pain/SOB   2. Chronic systolic HF/ICM - 02/2017 LVEF 16-10% - L>R leg swelling for many years.  - Has not been on ACE/ARB/ARNI due to poor renal function   - taking lasix 20mg  prn, takes about 3 days a week. We had recently cut back due to worsening renal function. When takes less than 3 days a week has significant leg swelling.  - no recent SOB/DOE  3. CKD 3   last Cr 08/02/17 1.64-->2.19. We changed his lasix from 40 daily to just 20mg  prn swelling, encourged increased hydration. Have continued to hold off on ACE-I/ARB/aldactone.   4. HTN -compliant with meds. Previously well controlled at our visits as well as during cardiac rehab, elevated today.     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   Past Medical History:  Diagnosis Date  . Anemia   . Anxiety   . CKD (chronic kidney disease) stage 3, GFR 30-59 ml/min   . Diastolic heart failure [facility])    Episode December 2017 in the setting of pneumonia  . Essential hypertension   . History of pneumonia   . Hyperlipemia   . Osteomyelitis (HCC)   . Type 2 diabetes mellitus (HCC)      Allergies  Allergen Reactions  . No Known Allergies      Current Outpatient Prescriptions  Medication Sig Dispense Refill  . amLODipine (NORVASC) 10 MG tablet Take 1 tablet (10 mg total) by mouth daily. 90 tablet 3  . aspirin EC 81 MG tablet Take 81 mg by mouth daily.    . carvedilol (COREG) 25 MG tablet Take 1 tablet (25 mg total) by mouth 2 (two) times daily. 180 tablet 3  .  citalopram (CELEXA) 20 MG tablet Take 20 mg by mouth daily.    . clopidogrel (PLAVIX) 75 MG tablet Take 1 tablet (75 mg total) by mouth daily. 90 tablet 3  . Cyanocobalamin (B-12 PO) Take 1 tablet by mouth daily.    Marland Kitchen DM-Phenylephrine-Acetaminophen (ALKA-SELTZER PLS SINUS & COUGH PO) Take 1 tablet by mouth daily as needed.    . furosemide (LASIX) 40 MG tablet Take 1 tablet (40 mg total) by mouth daily. 90 tablet 3  . glimepiride (AMARYL) 4 MG tablet Take 4 mg by mouth daily with breakfast.    . metFORMIN (GLUCOPHAGE) 500 MG tablet Take by mouth 2 (two) times daily with a meal.    . rosuvastatin (CRESTOR) 5 MG tablet Take 1 tablet (5 mg total) by mouth at bedtime. 90 tablet 3  . triamcinolone ointment (KENALOG) 0.1 % Apply 1 application topically 2 (two) times daily.    . Zinc 50 MG TABS Take 1 tablet by mouth daily.     No current facility-administered medications for this visit.      Past Surgical History:  Procedure Laterality Date  . AMPUTATION Right 12/24/2015   Procedure: PARTIAL THIRD RAY AMPUTATION OF THE RIGHT FOOT (AMPUTATION OF THE THIRD TOE AND A PORTION OF THE THIRD METATARSAL);  Surgeon: Ferman Hamming,  DPM;  Location: [facility];  Service: Podiatry;  Laterality: Right;  . CORONARY ARTERY BYPASS GRAFT N/A 03/22/2017   Procedure: CORONARY ARTERY BYPASS GRAFTING (CABG)x4 using left internal mammary artery, cryo shapenous vein and left greater saphenous vein harvested endoscopically.  LIMA-LAD SVG-DIAD CRYOVEIN -OM2 CRYOVEIN -OM1 EVH LEFT THIGH, RIGHT GSV EXPLORED -TOO SMALL TO USE;  Surgeon: Kerin PernaVan Trigt, Peter, MD;  Location: [facility] OR;  Service: Open Heart Surgery;  Laterality: N/A;  . LEFT HEART CATH AND CORONARY ANGIOGRAPHY N/A 03/15/2017   Procedure: Left Heart Cath and Coronary Angiography;  Surgeon: Lennette Biharihomas A Kelly, MD;  Location: [facility] INVASIVE CV LAB;  Service: Cardiovascular;  Laterality: N/A;  . RIGHT HEART CATH N/A 03/18/2017   Procedure: Right Heart Cath;  Surgeon: Laurey Moralealton S McLean, MD;   [facility];  Service: Cardiovascular;  Laterality: N/A;  . TEE WITHOUT CARDIOVERSION N/A 03/22/2017   Procedure: TRANSESOPHAGEAL ECHOCARDIOGRAM (TEE);  Surgeon: Donata ClayVan Trigt, Theron AristaPeter, MD;  [facility];  Service: Open Heart Surgery;  Laterality: N/A;     Allergies  Allergen Reactions  . No Known Allergies       Family History  Problem Relation Age of Onset  . Adopted: Yes     Social History [name] reports that he has never smoked. He has never used smokeless tobacco. [name] reports that he does not drink alcohol.   Review of Systems CONSTITUTIONAL: No weight loss, fever, chills, weakness or fatigue.  HEENT: Eyes: No visual loss, blurred vision, double vision or yellow sclerae.No hearing loss, sneezing, congestion, runny nose or sore throat.  SKIN: No rash or itching.  CARDIOVASCULAR: per hpi RESPIRATORY: No shortness of breath, cough or sputum.  GASTROINTESTINAL: No anorexia, nausea, vomiting or diarrhea. No abdominal pain or blood.  GENITOURINARY: No burning on urination, no polyuria NEUROLOGICAL: No headache, dizziness, syncope, paralysis, ataxia, numbness or tingling in the extremities. No change in bowel or bladder control.  MUSCULOSKELETAL: No muscle, back pain, joint pain or stiffness.  LYMPHATICS: No enlarged nodes. No history of splenectomy.  PSYCHIATRIC: No history of depression or anxiety.  ENDOCRINOLOGIC: No reports of sweating, cold or heat intolerance. No polyuria or polydipsia.  Marland Kitchen.   Physical Examination Vitals:   09/13/17 1401  BP: (!) 178/90  Pulse: 67  SpO2: 97%   Vitals:   09/13/17 1401  Weight: 184 lb (83.5 kg)  Height: 5\' 10"  (1.778 m)    Gen: resting comfortably, no acute distress HEENT: no scleral icterus, pupils equal round and reactive, no palptable cervical adenopathy,  CV: RRR, no m/r/g, no jvd Resp: Clear to auscultation bilaterally GI: abdomen is soft, non-tender, non-distended, normal bowel sounds, no  hepatosplenomegaly MSK: extremities are warm, 1-2+ bilateral LE edema Skin: warm, no rash Neuro:  no focal deficits Psych: appropriate affect   Diagnostic Studies 03/2017 cath  LM lesion, 90 %stenosed.  Ost LAD to Prox LAD lesion, 95 %stenosed.  Ost 1st Diag to 1st Diag lesion, 90 %stenosed.  Ost 3rd Mrg to 3rd Mrg lesion, 95 %stenosed.  Mid Cx to Dist Cx lesion, 90 %stenosed.  RPDA lesion, 85 %stenosed.  Mid RCA lesion, 20 %stenosed.  Acute Mrg lesion, 95 %stenosed.  Findings are compatible with a significant ischemic cardiomyopathy in this patient with echo Doppler documentation of an EF of 30-35%.  Severe multivessel CAD with coronary calcification and diffuse 90-95% near ostial to mid LAD stenoses with diffuse 90% stenosis in the first diagonal vessel; 95 and 90% diffuse stenoses in segmental obtuse marginal  branches of the left circumflex coronary artery with good distal targets, and diffuse 80% to 90% PDA stenosis with 20% mid RCA stenosis and 95% proximal stenosis and a small proximal Julena Barbour of the RCA.  LVEDP 34 mm Hg.  RECOMMENDATION: Surgical consultation will be obtained for CABG revascularization surgery.    Assessment and Plan  1. CAD - s/p CABG, currently asymptomatic - continue current meds   2. Chronic systolic HF - No ACE/ARB/aldactone due to renal function - recent increaed edema with cutting back lasix, improving with taking 20mg  3-4 days a week - repeat echo - may consider hydral/nitrates if LVEF remains down, particularly if bp's remain elevated   3. CKD 3 - Cr had been trending down, recently trending back up despite decrease in lasix - we will refer to nephrology  4. HTN - elevated in clinic for first time.  - submit bp log in 1 week, pending results will titrate meds       Antoine Poche, M.D.

## 2017-09-14 ENCOUNTER — Telehealth: Payer: Self-pay

## 2017-09-14 DIAGNOSIS — N289 Disorder of kidney and ureter, unspecified: Secondary | ICD-10-CM

## 2017-09-14 NOTE — Telephone Encounter (Signed)
-----   Message from Antoine PocheJonathan F Branch, MD sent at 09/14/2017  1:10 PM EDT ----- Heart function mildly improved (currently 35-40%, had been 30-35%. Normal is 50-60%). Continue current meds   Dominga FerryJ Branch MD

## 2017-09-20 ENCOUNTER — Ambulatory Visit: Payer: BLUE CROSS/BLUE SHIELD | Admitting: Cardiology

## 2017-11-22 ENCOUNTER — Ambulatory Visit (INDEPENDENT_AMBULATORY_CARE_PROVIDER_SITE_OTHER): Payer: BLUE CROSS/BLUE SHIELD | Admitting: Cardiology

## 2017-11-22 ENCOUNTER — Encounter: Payer: Self-pay | Admitting: Cardiology

## 2017-11-22 VITALS — BP 144/68 | HR 78 | Ht 70.0 in | Wt 178.0 lb

## 2017-11-22 DIAGNOSIS — I5022 Chronic systolic (congestive) heart failure: Secondary | ICD-10-CM | POA: Diagnosis not present

## 2017-11-22 DIAGNOSIS — I2510 Atherosclerotic heart disease of native coronary artery without angina pectoris: Secondary | ICD-10-CM | POA: Diagnosis not present

## 2017-11-22 DIAGNOSIS — I10 Essential (primary) hypertension: Secondary | ICD-10-CM | POA: Diagnosis not present

## 2017-11-22 NOTE — Patient Instructions (Addendum)
Medication Instructions:  Your physician recommends that you continue on your current medications as directed. Please refer to the Current Medication list given to you today.   Labwork: NONE  Testing/Procedures: NONE  Follow-Up: Your physician recommends that you schedule a follow-up appointment in: 3 MONTHS    Any Other Special Instructions Will Be Listed Below (If Applicable).  I WILL CALL YOU ONCE YOUR DOT NOTE IS READY    If you need a refill on your cardiac medications before your next appointment, please call your pharmacy.

## 2017-11-22 NOTE — Progress Notes (Signed)
Clinical Summary Douglas Cannon is a 62 y.o.male seen today for follow up of the following medical problems.    1. CAD - admit with NSTEMI, 03/2017 cath showed multivessel CAD and referred for CABG - echo 02/2017 LVEF 30-35%, abnormal diastolic function, mild AI, mild MR - 03/22/17 4 vessel CABG (LIMA-LAD, SVG-Diag, SVG to OM1, SVG-OM2) - started on plavix due to use of cryo veins during surgery and presenting with NSTEMI   - no recent chest pain/SOB   2. Chronic systolic HF/ICM - 02/2017 LVEF 16-10%30-35% - 08/2017 echo LVEF 35-40%, grade II diastoilc dysfunction - L>R leg swelling for many years.  -Has not been on ACE/ARB/ARNI due to poor renal function   - new job requiring long hours of heavy lifting. Denies any recent symptoms with this increased activity - no recent LE edema, no SOB/DOE.  - compliant with meds. Wt 172-177 lbs.   3. CKD 3   last Cr 08/02/17 1.64-->2.19. We changed his lasix from 40 daily to just 20mg  prn swelling, encourged increased hydration. Have continued to hold off on ACE-I/ARB/aldactone.   - repeat labs 10/2017 Cr 1.97, BUN 33 with nephrologist. Considering kidney biopsy.   4. 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 Anemia   . Anxiety   . CKD (chronic kidney disease) stage 3, GFR 30-59 ml/min (HCC)   . Diastolic heart failure [facility](HCC)    Episode December 2017 in the setting of pneumonia  . Essential hypertension   . History of pneumonia   . Hyperlipemia   . Osteomyelitis (HCC)   . Type 2 diabetes mellitus (HCC)      Allergies  Allergen Reactions  . No Known Allergies      Current Outpatient Medications  Medication Sig Dispense Refill  . amLODipine (NORVASC) 10 MG tablet Take 1 tablet (10 mg total) by mouth daily. 90 tablet 3  . aspirin EC 81 MG tablet Take 81 mg by mouth  daily.    . carvedilol (COREG) 25 MG tablet Take 1 tablet (25 mg total) by mouth 2 (two) times daily. 180 tablet 3  . citalopram (CELEXA) 20 MG tablet Take 20 mg by mouth daily.    . clopidogrel (PLAVIX) 75 MG tablet Take 1 tablet (75 mg total) by mouth daily. 90 tablet 3  . Cyanocobalamin (B-12 PO) Take 1 tablet by mouth daily.    Marland Kitchen. DM-Phenylephrine-Acetaminophen (ALKA-SELTZER PLS SINUS & COUGH PO) Take 1 tablet by mouth daily as needed.    . furosemide (LASIX) 40 MG tablet Take 1 tablet (40 mg total) by mouth daily. 90 tablet 3  . glimepiride (AMARYL) 4 MG tablet Take 4 mg by mouth daily with breakfast.    . metFORMIN (GLUCOPHAGE) 500 MG tablet Take by mouth 2 (two) times daily with a meal.    . rosuvastatin (CRESTOR) 5 MG tablet Take 1 tablet (5 mg total) by mouth at bedtime. 90 tablet 3  . triamcinolone ointment (KENALOG) 0.1 % Apply 1 application topically 2 (two) times daily.    . Zinc 50 MG TABS Take 1 tablet by mouth daily.     No current facility-administered medications for this visit.      Past Surgical History:  Procedure Laterality Date  . AMPUTATION Right 12/24/2015   Procedure: PARTIAL THIRD RAY AMPUTATION OF THE RIGHT  FOOT (AMPUTATION OF THE THIRD TOE AND A PORTION OF THE THIRD METATARSAL);  Surgeon: Ferman Hamming, DPM;  Location: [facility];  Service: Podiatry;  Laterality: Right;  . CORONARY ARTERY BYPASS GRAFT N/A 03/22/2017   Procedure: CORONARY ARTERY BYPASS GRAFTING (CABG)x4 using left internal mammary artery, cryo shapenous vein and left greater saphenous vein harvested endoscopically.  LIMA-LAD SVG-DIAD CRYOVEIN -OM2 CRYOVEIN -OM1 EVH LEFT THIGH, RIGHT GSV EXPLORED -TOO SMALL TO USE;  Surgeon: Kerin Perna, MD;  Location: [facility] OR;  Service: Open Heart Surgery;  Laterality: N/A;  . LEFT HEART CATH AND CORONARY ANGIOGRAPHY N/A 03/15/2017   Procedure: Left Heart Cath and Coronary Angiography;  Surgeon: Lennette Bihari, MD;  Location: [facility] INVAS[facility] CV LAB;  Service:  Cardiovascular;  Laterality: N/A;  . RIGHT HEART CATH N/A 03/18/2017   Procedure: Right Heart Cath;  Surgeon: Laurey Morale, MD;  Location: [facility] INVASIVE CV LAB;  Service: Cardiovascular;  Laterality: N/A;  . TEE WITHOUT CARDIOVERSION N/A 03/22/2017   Procedure: TRANSESOPHAGEAL ECHOCARDIOGRAM (TEE);  Surgeon: Donata Clay, Theron Arista, MD;  Location: [facility] OR;  Service: Open Heart Surgery;  Laterality: N/A;     Allergies  Allergen Reactions  . No Known Allergies       Family History  Adopted: Yes     Social History Mr. Leatham reports that  has never smoked. he has never used smokeless tobacco. Mr. Eick reports that he does not drink alcohol.   Review of Systems CONSTITUTIONAL: No weight loss, fever, chills, weakness or fatigue.  HEENT: Eyes: No visual loss, blurred vision, double vision or yellow sclerae.No hearing loss, sneezing, congestion, runny nose or sore throat.  SKIN: No rash or itching.  CARDIOVASCULAR: per hpi RESPIRATORY: No shortness of breath, cough or sputum.  GASTROINTESTINAL: No anorexia, nausea, vomiting or diarrhea. No abdominal pain or blood.  GENITOURINARY: No burning on urination, no polyuria NEUROLOGICAL: No headache, dizziness, syncope, paralysis, ataxia, numbness or tingling in the extremities. No change in bowel or bladder control.  MUSCULOSKELETAL: No muscle, back pain, joint pain or stiffness.  LYMPHATICS: No enlarged nodes. No history of splenectomy.  PSYCHIATRIC: No history of depression or anxiety.  ENDOCRINOLOGIC: No reports of sweating, cold or heat intolerance. No polyuria or polydipsia.  Marland Kitchen   Physical Examination Vitals:   11/22/17 0839  BP: (!) 144/68  Pulse: 78  SpO2: 98%   Vitals:   11/22/17 0839  Weight: 178 lb (80.7 kg)  Height: 5\' 10"  (1.778 m)    Gen: resting comfortably, no acute distress HEENT: no scleral icterus, pupils equal round and reactive, no palptable cervical adenopathy,  CV: RRR, no m/rg, no jvd Resp: Clear to  auscultation bilaterally GI: abdomen is soft, non-tender, non-distended, normal bowel sounds, no hepatosplenomegaly MSK: extremities are warm, no edema.  Skin: warm, no rash Neuro:  no focal deficits Psych: appropriate affect   Diagnostic Studies 03/2017 cath  LM lesion, 90 %stenosed.  Ost LAD to Prox LAD lesion, 95 %stenosed.  Ost 1st Diag to 1st Diag lesion, 90 %stenosed.  Ost 3rd Mrg to 3rd Mrg lesion, 95 %stenosed.  Mid Cx to Dist Cx lesion, 90 %stenosed.  RPDA lesion, 85 %stenosed.  Mid RCA lesion, 20 %stenosed.  Acute Mrg lesion, 95 %stenosed.  Findings are compatible with a significant ischemic cardiomyopathy in this patient with echo Doppler documentation of an EF of 30-35%.  Severe multivessel CAD with coronary calcification and diffuse 90-95% near ostial to mid LAD stenoses with diffuse 90% stenosis in the first diagonal vessel;  95 and 90% diffuse stenoses in segmental obtuse marginal branches of the left circumflex coronary artery with good distal targets, and diffuse 80% to 90% PDA stenosis with 20% mid RCA stenosis and 95% proximal stenosis and a small proximal Kery Haltiwanger of the RCA.  LVEDP 34 mm Hg.  RECOMMENDATION: Surgical consultation will be obtained for CABG revascularization surgery.  08/2017 echo Study Conclusions  - Left ventricle: The cavity size was normal. Wall thickness was   increased in a pattern of mild LVH. Systolic function was   moderately reduced. The estimated ejection fraction was in the   range of 35% to 40%. Features are consistent with a pseudonormal   left ventricular filling pattern, with concomitant abnormal   relaxation and increased filling pressure (grade 2 diastolic   dysfunction). Doppler parameters are consistent with high   ventricular filling pressure. - Aortic valve: Mildly calcified annulus. Normal thickness   leaflets. Valve area (VTI): 3.71 cm^2. Valve area (Vmax): 3.39   cm^2. - Mitral valve: There was mild  regurgitation. The MR VC is 0.4 cm. - Left atrium: The atrium was moderately dilated. - Atrial septum: No defect or patent foramen ovale was identified. - Technically difficult study.    Assessment and Plan  1. CAD - s/p CABG, currently asymptomatic - we will continue current therapy.    2. Chronic systolic HF - No ACE/ARB/aldactone due to renal function - no recent symptoms. Continue current meds - may consider hydral/nitrates if LVEF remains down, particularly if bp's remain elevated   3. CKD 3 - followed by nephrology  4. HTN - continue current meds   Will provide letter documenting there are no work limitations for the patient.    Antoine Poche, M.D.

## 2017-11-24 ENCOUNTER — Telehealth: Payer: Self-pay

## 2017-11-24 ENCOUNTER — Encounter: Payer: Self-pay | Admitting: Cardiology

## 2017-11-24 NOTE — Telephone Encounter (Signed)
-----   Message from Antoine PocheJonathan F Branch, MD sent at 11/24/2017 10:48 AM EST ----- Please provider this in a letter for the patient to pick up  To whom it may concern,  Mr Douglas BondDavid Cannon is followed in our cardiology clinic for his history of heart disease. Currently he is doing very well on medications without any recent symptoms. 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 Please consider him for any position he desires.    Dina RichJonathan Branch MD

## 2017-11-24 NOTE — Telephone Encounter (Signed)
Called pt to let him know his letter was available for pick up.No answer, no voicemail available. I will place letter in drawer.

## 2017-12-05 ENCOUNTER — Telehealth: Payer: Self-pay | Admitting: Cardiology

## 2017-12-05 DIAGNOSIS — I5022 Chronic systolic (congestive) heart failure: Secondary | ICD-10-CM

## 2017-12-05 NOTE — Telephone Encounter (Signed)
Exercise nuclear stress test, hold coreg day of test   Dominga FerryJ Oberia Beaudoin MD

## 2017-12-05 NOTE — Telephone Encounter (Signed)
What type of test do you want ?

## 2017-12-05 NOTE — Telephone Encounter (Signed)
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

## 2017-12-06 NOTE — Telephone Encounter (Signed)
Order placed, pt aware 

## 2017-12-13 ENCOUNTER — Encounter (HOSPITAL_COMMUNITY)
Admission: RE | Admit: 2017-12-13 | Discharge: 2017-12-13 | Disposition: A | Discharge status: Home / Self Care | Payer: BLUE CROSS/BLUE SHIELD | Source: Ambulatory Visit | Attending: Cardiology | Admitting: Cardiology

## 2017-12-13 ENCOUNTER — Encounter (HOSPITAL_COMMUNITY): Payer: Self-pay

## 2017-12-13 ENCOUNTER — Ambulatory Visit: Payer: BLUE CROSS/BLUE SHIELD | Admitting: Student

## 2017-12-13 ENCOUNTER — Encounter (HOSPITAL_BASED_OUTPATIENT_CLINIC_OR_DEPARTMENT_OTHER)
Admission: RE | Admit: 2017-12-13 | Discharge: 2017-12-13 | Disposition: A | Discharge status: Home / Self Care | Payer: BLUE CROSS/BLUE SHIELD | Source: Ambulatory Visit | Attending: Cardiology | Admitting: Cardiology

## 2017-12-13 DIAGNOSIS — I5022 Chronic systolic (congestive) heart failure: Secondary | ICD-10-CM

## 2017-12-13 LAB — NM MYOCAR MULTI W/SPECT W/WALL MOTION / EF
Estimated workload: 11.6 METS
Exercise duration (min): 10 min
Exercise duration (sec): 3 s
LV dias vol: 145 mL (ref 62–150)
LV sys vol: 87 mL
MPHR: 160 {beats}/min
Peak HR: 127 {beats}/min
Percent HR: 79 %
RATE: 0.44
RPE: 12
Rest HR: 71 {beats}/min
SDS: 8
SRS: 10
SSS: 16
TID: 0.97

## 2017-12-13 MED ORDER — SODIUM CHLORIDE 0.9% FLUSH
INTRAVENOUS | Status: AC
  Administered 2017-12-13: 10 mL via INTRAVENOUS
  Filled 2017-12-13: qty 10

## 2017-12-13 MED ORDER — TECHNETIUM TC 99M TETROFOSMIN IV KIT
10.0000 | PACK | Freq: Once | INTRAVENOUS | Status: AC | PRN
  Administered 2017-12-13: 11 via INTRAVENOUS

## 2017-12-13 MED ORDER — SODIUM CHLORIDE 0.9% FLUSH
INTRAVENOUS | Status: AC
  Filled 2017-12-13: qty 100

## 2017-12-13 MED ORDER — REGADENOSON 0.4 MG/5ML IV SOLN
INTRAVENOUS | Status: AC
  Administered 2017-12-13: 0.4 mg via INTRAVENOUS
  Filled 2017-12-13: qty 5

## 2017-12-13 MED ORDER — TECHNETIUM TC 99M TETROFOSMIN IV KIT
30.0000 | PACK | Freq: Once | INTRAVENOUS | Status: AC | PRN
Start: 2017-12-13 — End: 2017-12-13
  Administered 2017-12-13: 34.7 via INTRAVENOUS

## 2018-01-24 ENCOUNTER — Telehealth: Payer: Self-pay | Admitting: Cardiology

## 2018-01-24 MED ORDER — ROSUVASTATIN CALCIUM 5 MG PO TABS: 5.0000 mg | ORAL_TABLET | Freq: Every day | ORAL | 3 refills | Status: DC

## 2018-01-24 NOTE — Telephone Encounter (Signed)
°*  STAT* If patient is at the pharmacy, call can be transferred to refill team.   1. Which medications need to be refilled? (please list name of each medication and dose if known)  rosuvastatin (CRESTOR) 5 MG tablet [[id number]][[id number]]   2. Which pharmacy/location (including street and city if local pharmacy) is medication to be sent to? [place] [facility]  3. Do they need a 30 day or 90 day supply?  90 day

## 2018-02-28 ENCOUNTER — Other Ambulatory Visit (HOSPITAL_COMMUNITY)
Admission: RE | Admit: 2018-02-28 | Discharge: 2018-02-28 | Disposition: A | Discharge status: Home / Self Care | Payer: BLUE CROSS/BLUE SHIELD | Source: Ambulatory Visit | Attending: Cardiology | Admitting: Cardiology

## 2018-02-28 ENCOUNTER — Encounter: Payer: Self-pay | Admitting: Cardiology

## 2018-02-28 ENCOUNTER — Ambulatory Visit (INDEPENDENT_AMBULATORY_CARE_PROVIDER_SITE_OTHER): Payer: BLUE CROSS/BLUE SHIELD | Admitting: Cardiology

## 2018-02-28 VITALS — BP 152/78 | HR 68 | Ht 70.0 in | Wt 184.0 lb

## 2018-02-28 DIAGNOSIS — I10 Essential (primary) hypertension: Secondary | ICD-10-CM

## 2018-02-28 DIAGNOSIS — N183 Chronic kidney disease, stage 3 unspecified: Secondary | ICD-10-CM

## 2018-02-28 DIAGNOSIS — I2510 Atherosclerotic heart disease of native coronary artery without angina pectoris: Secondary | ICD-10-CM

## 2018-02-28 DIAGNOSIS — I5022 Chronic systolic (congestive) heart failure: Secondary | ICD-10-CM | POA: Diagnosis not present

## 2018-02-28 DIAGNOSIS — Z79899 Other long term (current) drug therapy: Secondary | ICD-10-CM

## 2018-02-28 LAB — LIPID PANEL
Cholesterol: 163 mg/dL (ref 0–200)
HDL: 61 mg/dL (ref >40)
LDL Cholesterol: 80 mg/dL (ref 0–99)
Total CHOL/HDL Ratio: 2.7 RATIO
Triglycerides: 112 mg/dL (ref <150)
VLDL: 22 mg/dL (ref 0–40)

## 2018-02-28 LAB — CBC WITH DIFFERENTIAL/PLATELET
Basophils Absolute: 0.1 10*3/uL (ref 0.0–0.1)
Basophils Relative: 1 %
Eosinophils Absolute: 0.4 10*3/uL (ref 0.0–0.7)
Eosinophils Relative: 4 %
HCT: 39 % (ref 39.0–52.0)
Hemoglobin: 12.8 g/dL — ABNORMAL LOW (ref 13.0–17.0)
Lymphocytes Relative: 18 %
Lymphs Abs: 1.6 10*3/uL (ref 0.7–4.0)
MCH: 29.7 pg (ref 26.0–34.0)
MCHC: 32.8 g/dL (ref 30.0–36.0)
MCV: 90.5 fL (ref 78.0–100.0)
Monocytes Absolute: 0.6 10*3/uL (ref 0.1–1.0)
Monocytes Relative: 6 %
Neutro Abs: 6.7 10*3/uL (ref 1.7–7.7)
Neutrophils Relative %: 71 %
Platelets: 212 10*3/uL (ref 150–400)
RBC: 4.31 MIL/uL (ref 4.22–5.81)
RDW: 13.4 % (ref 11.5–15.5)
WBC: 9.4 10*3/uL (ref 4.0–10.5)

## 2018-02-28 LAB — BASIC METABOLIC PANEL
Anion gap: 14 (ref 5–15)
BUN: 49 mg/dL — ABNORMAL HIGH (ref 6–20)
CO2: 23 mmol/L (ref 22–32)
Calcium: 9.5 mg/dL (ref 8.9–10.3)
Chloride: 103 mmol/L (ref 101–111)
Creatinine, Ser: 2.17 mg/dL — ABNORMAL HIGH (ref 0.61–1.24)
GFR calc Af Amer: 36 mL/min — ABNORMAL LOW (ref >60)
GFR calc non Af Amer: 31 mL/min — ABNORMAL LOW (ref >60)
Glucose, Bld: 131 mg/dL — ABNORMAL HIGH (ref 65–99)
Potassium: 4.6 mmol/L (ref 3.5–5.1)
Sodium: 140 mmol/L (ref 135–145)

## 2018-02-28 LAB — MAGNESIUM: Magnesium: 2.1 mg/dL (ref 1.7–2.4)

## 2018-02-28 MED ORDER — AMLODIPINE BESYLATE 10 MG PO TABS: 10.0000 mg | ORAL_TABLET | Freq: Every day | ORAL | 3 refills | Status: DC

## 2018-02-28 NOTE — Patient Instructions (Signed)
Medication Instructions:  STOP PLAVIX Mar 22, 2018    Labwork: CBC MAGNESIUM BMET FASTING LIPIDS  Testing/Procedures: NONE  Follow-Up: Your physician wants you to follow-up in: 6 MONTHS .  You will receive a reminder letter in the mail two months in advance. If you don't receive a letter, please call our office to schedule the follow-up appointment.   Any Other Special Instructions Will Be Listed Below (If Applicable).     If you need a refill on your cardiac medications before your next appointment, please call your pharmacy.

## 2018-02-28 NOTE — Progress Notes (Signed)
Clinical Summary Mr. Verrilli is a 62 y.o.male  seen today for follow up of the following medical problems.    1. 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 Inferolateral scar with mild per-infarct ischemia   - no chest pain, no SOB/DOE - compliant with meds.    2. Chronic systolic HF/ICM - 02/2017 LVEF 16-10% - 08/2017 echo LVEF 35-40%, grade II diastoilc dysfunction - L>R leg swelling for many years.  -Has not been on ACE/ARB/ARNI due to poor renal function   - no recent edema. Home weights 175-180 lbs - compliant with meds.    3. CKD 3 - avoiding nephrotoxic drugs    4. 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      Past Medical History:  Diagnosis Date  . Anemia   . Anxiety   . CKD (chronic kidney disease) stage 3, GFR 30-59 ml/min (HCC)   . Diastolic heart failure [facility])    Episode December 2017 in the setting of pneumonia  . Essential hypertension   . History of pneumonia   . Hyperlipemia   . Osteomyelitis (HCC)   . Type 2 diabetes mellitus (HCC)      Allergies  Allergen Reactions  . No Known Allergies      Current Outpatient Medications  Medication Sig Dispense Refill  . amLODipine (NORVASC) 10 MG tablet Take 1 tablet (10 mg total) by mouth daily. 90 tablet 3  . aspirin EC 81 MG tablet Take 81 mg by mouth daily.    . carvedilol (COREG) 25 MG tablet Take 1 tablet (25 mg total) by mouth 2 (two) times  daily. 180 tablet 3  . citalopram (CELEXA) 20 MG tablet Take 20 mg by mouth daily.    . clopidogrel (PLAVIX) 75 MG tablet Take 1 tablet (75 mg total) by mouth daily. 90 tablet 3  . Cyanocobalamin (B-12 PO) Take 1 tablet by mouth daily.    Marland Kitchen DM-Phenylephrine-Acetaminophen (ALKA-SELTZER PLS SINUS & COUGH PO) Take 1 tablet by mouth daily as needed.    . furosemide (LASIX) 40 MG tablet Take 1 tablet (40 mg total) by mouth daily. 90 tablet 3  . glimepiride (AMARYL) 4 MG tablet Take 4 mg by mouth daily with breakfast.    . metFORMIN (GLUCOPHAGE) 500 MG tablet Take by mouth 2 (two) times daily with a meal.    . rosuvastatin (CRESTOR) 5 MG tablet Take 1 tablet (5 mg total) by mouth at bedtime. 90 tablet 3  . triamcinolone ointment (KENALOG) 0.1 % Apply 1 application topically 2 (two) times daily.    . Zinc 50 MG TABS Take 1 tablet by mouth daily.     No current facility-administered medications for this visit.      Past Surgical History:  Procedure Laterality Date  . AMPUTATION Right 12/24/2015   Procedure: PARTIAL THIRD RAY AMPUTATION OF THE RIGHT FOOT (AMPUTATION OF THE THIRD TOE AND A PORTION OF THE THIRD METATARSAL);  Surgeon:  Ferman HammingBenjamin McKinney, DPM;  Location: [facility];  Service: Podiatry;  Laterality: Right;  . CORONARY ARTERY BYPASS GRAFT N/A 03/22/2017   Procedure: CORONARY ARTERY BYPASS GRAFTING (CABG)x4 using left internal mammary artery, cryo shapenous vein and left greater saphenous vein harvested endoscopically.  LIMA-LAD SVG-DIAD CRYOVEIN -OM2 CRYOVEIN -OM1 EVH LEFT THIGH, RIGHT GSV EXPLORED -TOO SMALL TO USE;  Surgeon: Kerin PernaVan Trigt, Peter, MD;  Location: [facility] OR;  Service: Open Heart Surgery;  Laterality: N/A;  . LEFT HEART CATH AND CORONARY ANGIOGRAPHY N/A 03/15/2017   Procedure: Left Heart Cath and Coronary Angiography;  Surgeon: Lennette Biharihomas A Kelly, MD;  Location: [facility] INVASIVE CV LAB;  Service: Cardiovascular;  Laterality: N/A;  . RIGHT HEART CATH N/A 03/18/2017   Procedure: Right Heart Cath;  Surgeon:  Laurey Moralealton S McLean, MD;  [facility];  Service: Cardiovascular;  Laterality: N/A;  . TEE WITHOUT CARDIOVERSION N/A 03/22/2017   Procedure: TRANSESOPHAGEAL ECHOCARDIOGRAM (TEE);  Surgeon: Donata ClayVan Trigt, Theron AristaPeter, MD;  [facility];  Service: Open Heart Surgery;  Laterality: N/A;     Allergies  Allergen Reactions  . No Known Allergies       Family History  Adopted: Yes     Social History Mr. Bartholomew BoardsMcGehee reports that he has never smoked. He has never used smokeless tobacco. Mr. Bartholomew BoardsMcGehee reports that he does not drink alcohol.   Review of Systems CONSTITUTIONAL: No weight loss, fever, chills, weakness or fatigue.  HEENT: Eyes: No visual loss, blurred vision, double vision or yellow sclerae.No hearing loss, sneezing, congestion, runny nose or sore throat.  SKIN: No rash or itching.  CARDIOVASCULAR: per hpi RESPIRATORY: No shortness of breath, cough or sputum.  GASTROINTESTINAL: No anorexia, nausea, vomiting or diarrhea. No abdominal pain or blood.  GENITOURINARY: No burning on urination, no polyuria NEUROLOGICAL: No headache, dizziness, syncope, paralysis, ataxia, numbness or tingling in the extremities. No change in bowel or bladder control.  MUSCULOSKELETAL: No muscle, back pain, joint pain or stiffness.  LYMPHATICS: No enlarged nodes. No history of splenectomy.  PSYCHIATRIC: No history of depression or anxiety.  ENDOCRINOLOGIC: No reports of sweating, cold or heat intolerance. No polyuria or polydipsia.  Marland Kitchen.   Physical Examination Vitals:   02/28/18 0820  BP: (!) 152/78  Pulse: 68  SpO2: 98%   Vitals:   02/28/18 0820  Weight: 184 lb (83.5 kg)  Height: 5\' 10"  (1.778 m)    Gen: resting comfortably, no acute distress HEENT: no scleral icterus, pupils equal round and reactive, no palptable cervical adenopathy,  CV: RRR, no m/r/g, no jvd Resp: Clear to auscultation bilaterally GI: abdomen is soft, non-tender, non-distended, normal bowel sounds, no  hepatosplenomegaly MSK: extremities are warm, no edema.  Skin: warm, no rash Neuro:  no focal deficits Psych: appropriate affect   Diagnostic Studies 03/2017 cath  LM lesion, 90 %stenosed.  Ost LAD to Prox LAD lesion, 95 %stenosed.  Ost 1st Diag to 1st Diag lesion, 90 %stenosed.  Ost 3rd Mrg to 3rd Mrg lesion, 95 %stenosed.  Mid Cx to Dist Cx lesion, 90 %stenosed.  RPDA lesion, 85 %stenosed.  Mid RCA lesion, 20 %stenosed.  Acute Mrg lesion, 95 %stenosed.  Findings are compatible with a significant ischemic cardiomyopathy in this patient with echo Doppler documentation of an EF of 30-35%.  Severe multivessel CAD with coronary calcification and diffuse 90-95% near ostial to mid LAD stenoses with diffuse 90% stenosis in the first diagonal vessel; 95 and 90% diffuse stenoses in segmental obtuse marginal branches of the left circumflex coronary  artery with good distal targets, and diffuse 80% to 90% PDA stenosis with 20% mid RCA stenosis and 95% proximal stenosis and a small proximal Amadeus Oyama of the RCA.  LVEDP 34 mm Hg.  RECOMMENDATION: Surgical consultation will be obtained for CABG revascularization surgery.  08/2017 echo Study Conclusions  - Left ventricle: The cavity size was normal. Wall thickness was increased in a pattern of mild LVH. Systolic function was moderately reduced. The estimated ejection fraction was in the range of 35% to 40%. Features are consistent with a pseudonormal left ventricular filling pattern, with concomitant abnormal relaxation and increased filling pressure (grade 2 diastolic dysfunction). Doppler parameters are consistent with high ventricular filling pressure. - Aortic valve: Mildly calcified annulus. Normal thickness leaflets. Valve area (VTI): 3.71 cm^2. Valve area (Vmax): 3.39 cm^2. - Mitral valve: There was mild regurgitation. The MR VC is 0.4 cm. - Left atrium: The atrium was moderately dilated. - Atrial  septum: No defect or patent foramen ovale was identified. - Technically difficult study.   Jan 2019 Exercise nuclear stress  No diagnostic ST segment changes following Lexiscan.  Large, moderate intensity, partially reversible inferolateral defect that is most consistent with scar with mild peri-infarct ischemia.  This is an intermediate risk study.  Nuclear stress EF: 40%.    Assessment and Plan  1. CAD - s/p CABG - no symptoms, continue current meds. Stop plavix 03/22/18, completing 1 year post ACS with CABG   2. Chronic systolic HF - No ACE/ARB/aldactone due to renal function -continue current meds   3. CKD 3 -followed by nephrology - avoid nephrotoxic meds - repeat labs  4. 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      Antoine Poche, M.D.

## 2018-03-06 ENCOUNTER — Telehealth: Payer: Self-pay

## 2018-03-06 ENCOUNTER — Encounter: Payer: Self-pay | Admitting: Cardiology

## 2018-03-06 MED ORDER — ROSUVASTATIN CALCIUM 10 MG PO TABS: 10.0000 mg | ORAL_TABLET | Freq: Every day | ORAL | 3 refills | Status: DC

## 2018-03-06 NOTE — Telephone Encounter (Signed)
-----   Message from Antoine PocheJonathan F Branch, MD sent at 03/03/2018 10:55 AM EDT ----- Labs show kidney function remains decreased but overall stable. Cholestrol slightly higher than goal, increase crestor to 10mg  daily   Dominga FerryJ Branch MD

## 2018-03-06 NOTE — Telephone Encounter (Signed)
Pt aware, escribed crestor to [facility]

## 2018-05-22 ENCOUNTER — Encounter (INDEPENDENT_AMBULATORY_CARE_PROVIDER_SITE_OTHER): Payer: Self-pay | Admitting: *Deleted

## 2018-06-20 IMAGING — DX DG CHEST 1V PORT
1 series · 1 of 1 positions shown · non-contrast
Comparison: 03/11/2017

CLINICAL DATA: Heart failure, chest pain

EXAM:
PORTABLE CHEST 1 VIEW

[chest ap]
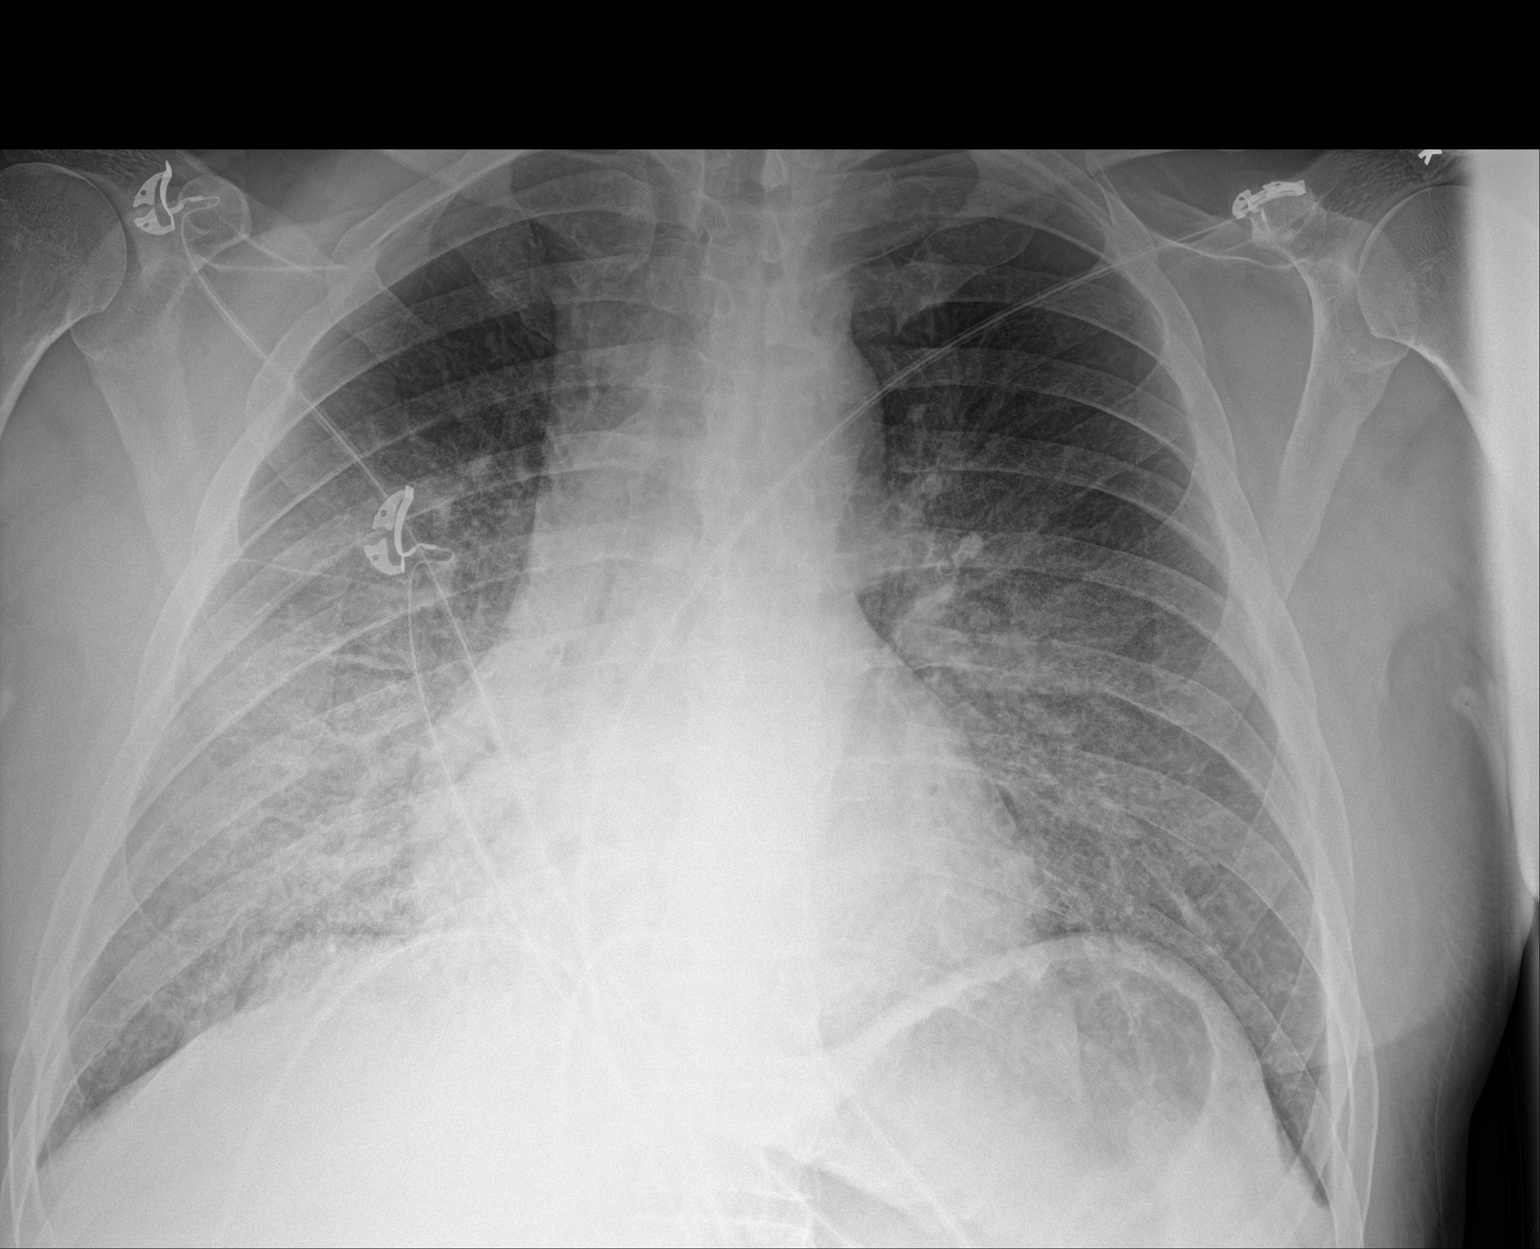

[1 of 1 positions shown; findings below may reference images not displayed]

FINDINGS: There is bilateral diffuse interstitial thickening. There is no
focal parenchymal opacity. There is no pleural effusion or
pneumothorax. The heart and mediastinum are stable.

The osseous structures are unremarkable.
IMPRESSION: Bilateral diffuse interstitial thickening concerning for mild
pulmonary edema.

## 2018-06-22 IMAGING — DX DG CHEST 1V PORT
1 series · 1 of 1 positions shown · non-contrast
Comparison: Chest x-ray of March 12, 2017

CLINICAL DATA: Hypoxia, history of CHF, diabetes, pneumonia.

EXAM:
PORTABLE CHEST 1 VIEW

[chest ap]
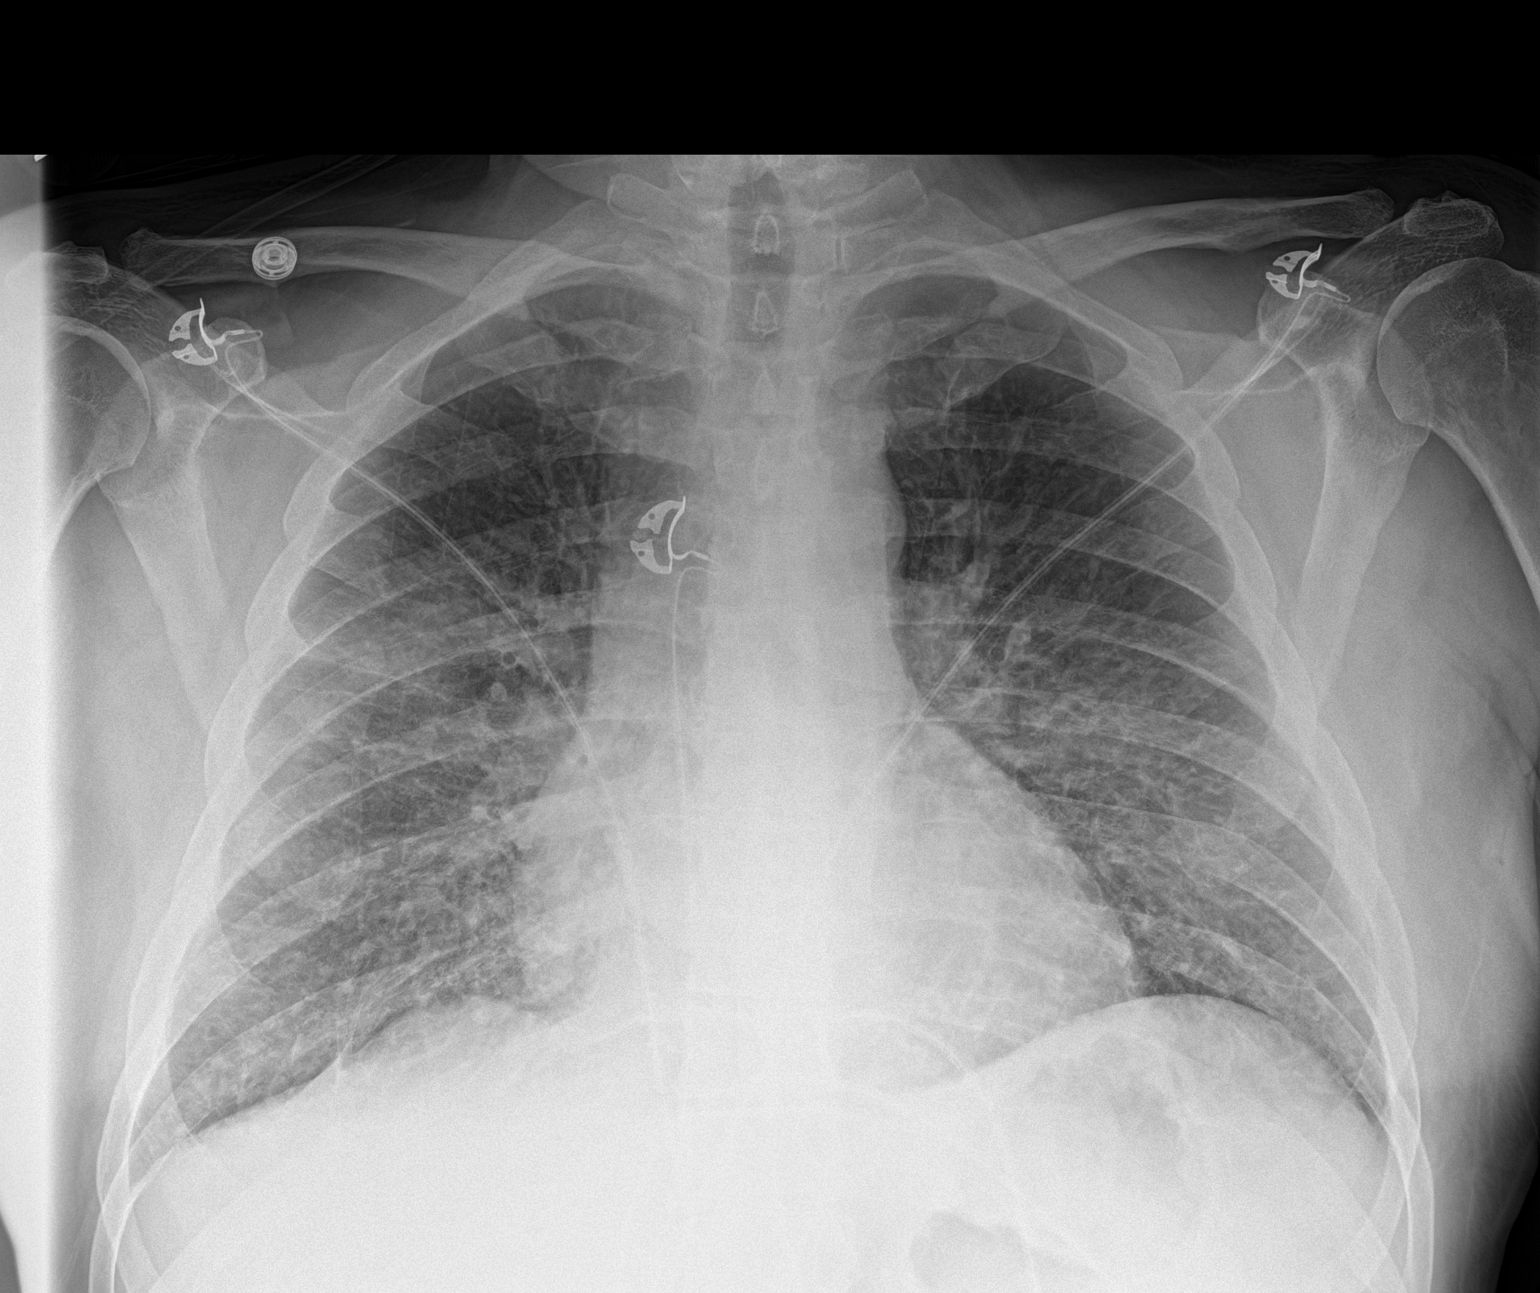

[1 of 1 positions shown; findings below may reference images not displayed]

FINDINGS: The lungs are well-expanded. The interstitial markings are less
prominent. The cardiac silhouette is top-normal in size but stable.
The central pulmonary vascularity is less engorged. There is no
pleural effusion.
IMPRESSION: Decreased pulmonary interstitial edema and pulmonary vascular
congestion is consistent with improving CHF. One cannot exclude
superimposed pneumonia but I favor the findings here being
predominantly due to CHF.

## 2018-06-26 IMAGING — CT CT CHEST W/O CM
2 of 3 series · 15 of 36 positions shown, 18 images · non-contrast
Comparison: Chest radiograph 03/18/2017

CLINICAL DATA: Aortic dilatation. Pt states been having some SOB
and is going for CABG next week.

EXAM:
CT CHEST WITHOUT CONTRAST
TECHNIQUE: Multidetector CT imaging of the chest was performed following the
standard protocol without IV contrast.

[Series 4: thorax 2.0 · axial · 0.83mm/px · z∈[+1244,+1538]mm · 12 of 173 slices shown, 15 images]
[im 13/173  mediastinal]
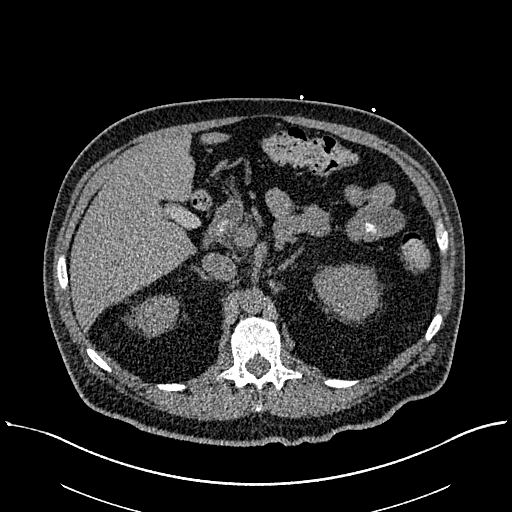
[im 13/173  lung]
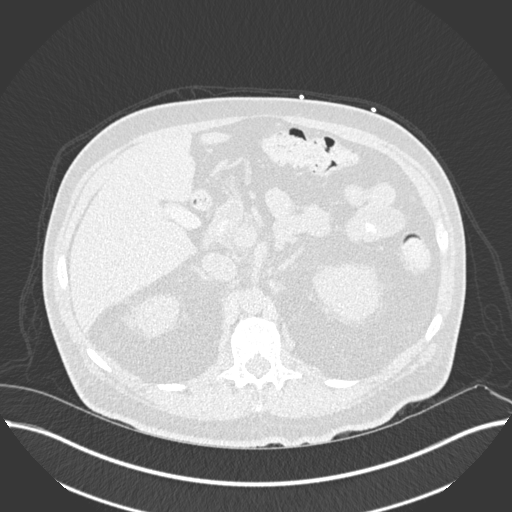
[im 26/173  lung]
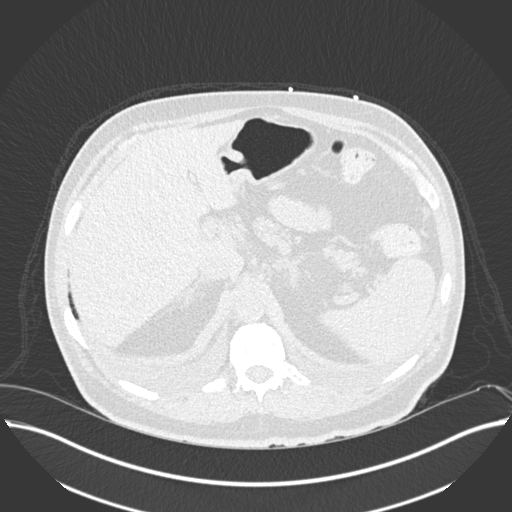
[im 39/173  lung]
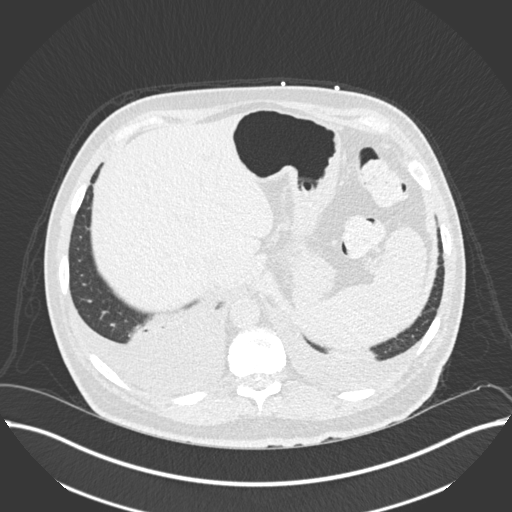
[im 51/173  lung]
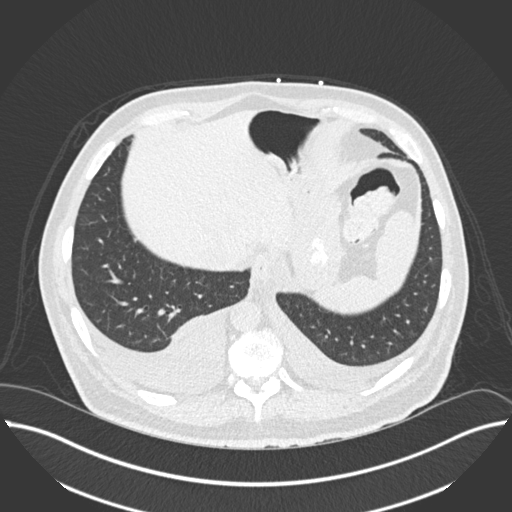
[im 64/173  mediastinal]
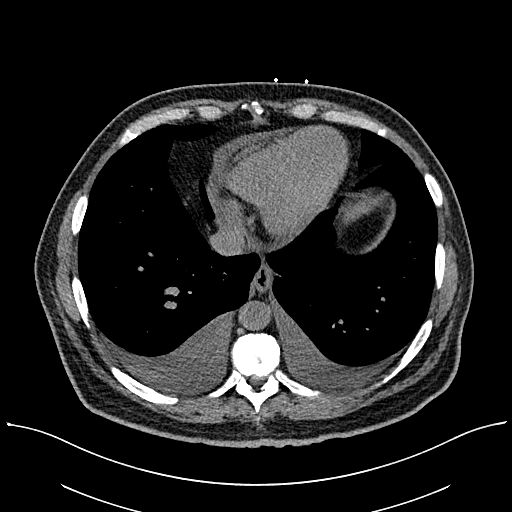
[im 64/173  lung]
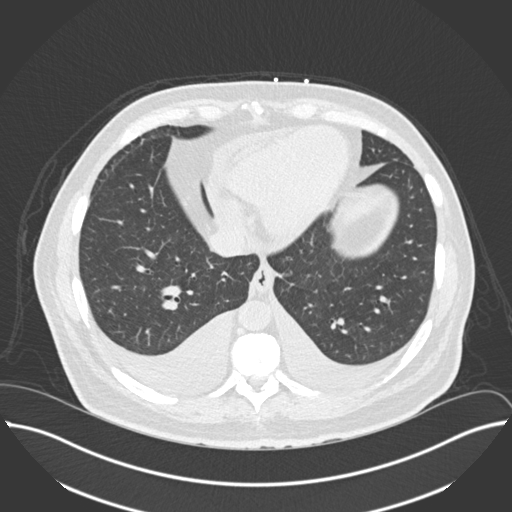
[im 77/173  lung]
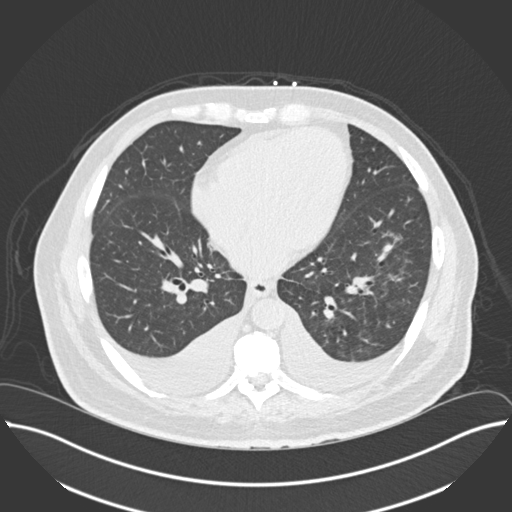
[im 96/173  lung]
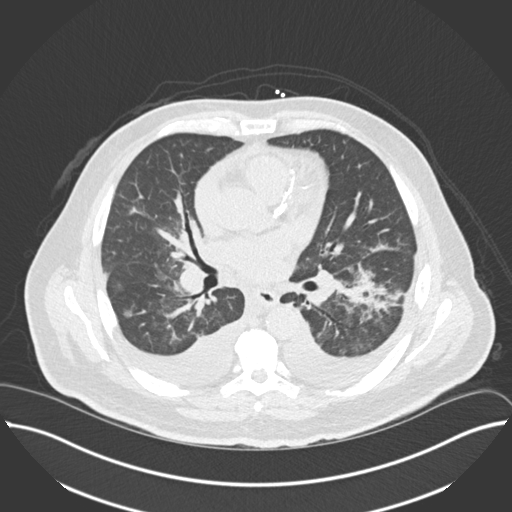
[im 109/173  lung]
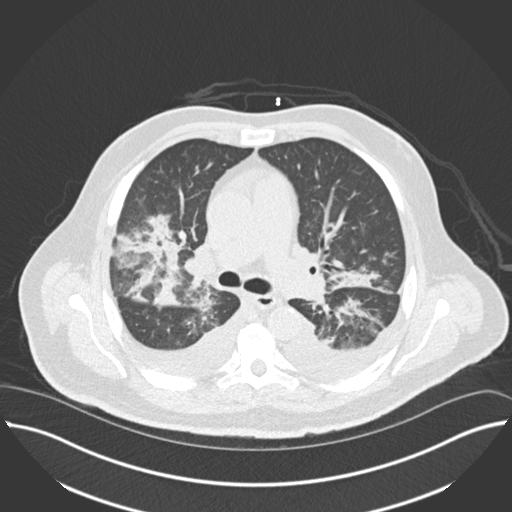
[im 122/173  mediastinal]
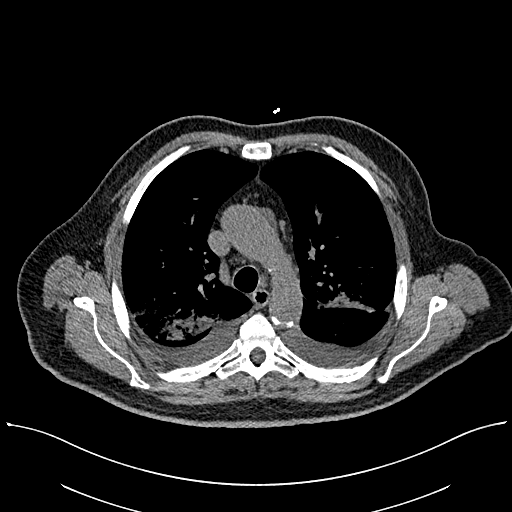
[im 122/173  lung]
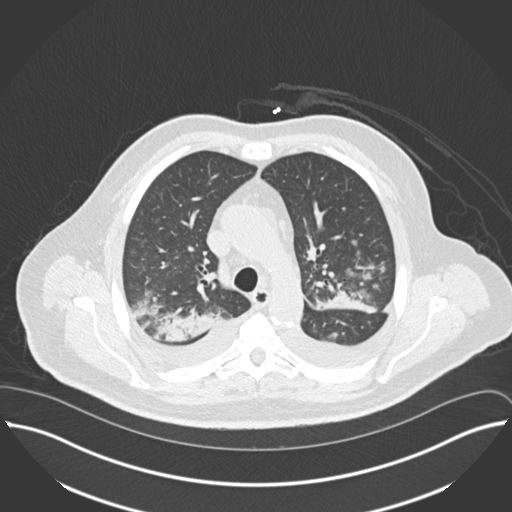
[im 134/173  lung]
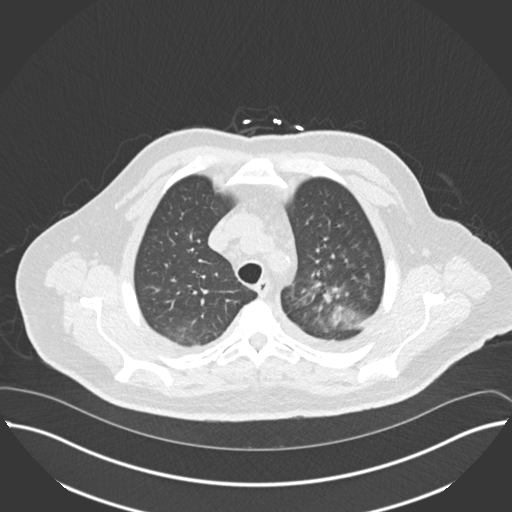
[im 147/173  lung]
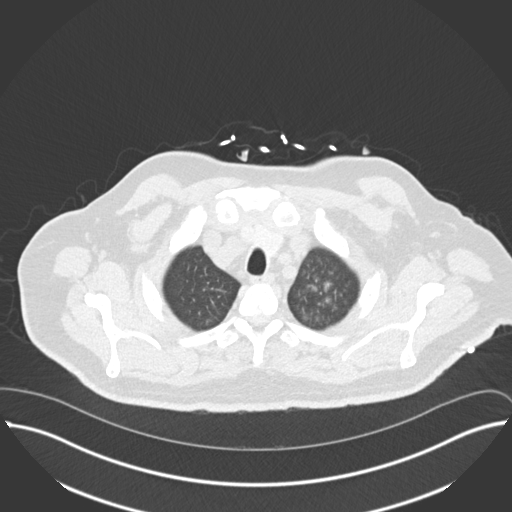
[im 160/173  lung]
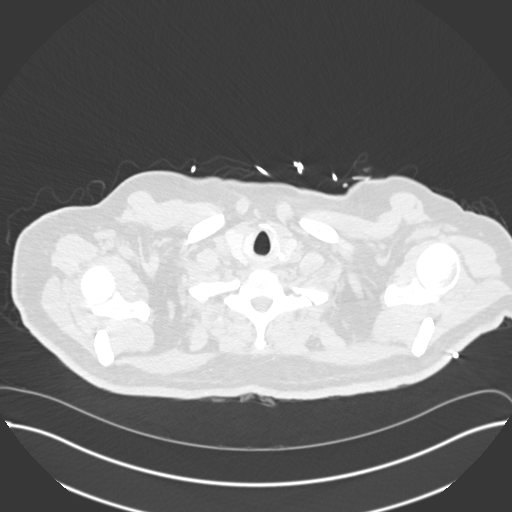

[Series 6: coronal · coronal · 0.67mm/px · 3 of 110 slices shown]
[im 22/110  lung]
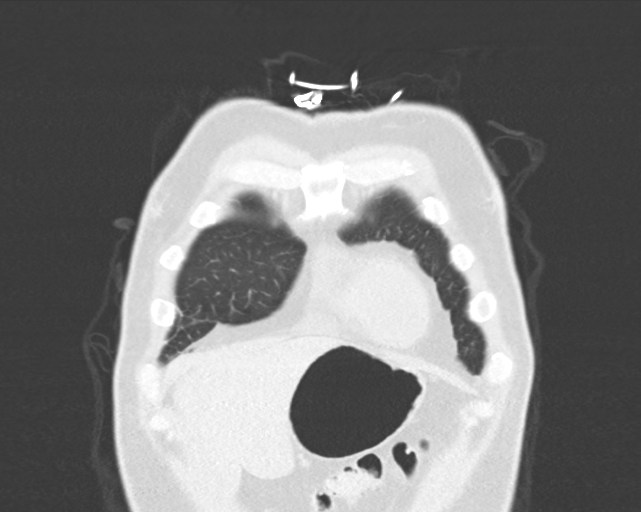
[im 44/110  lung]
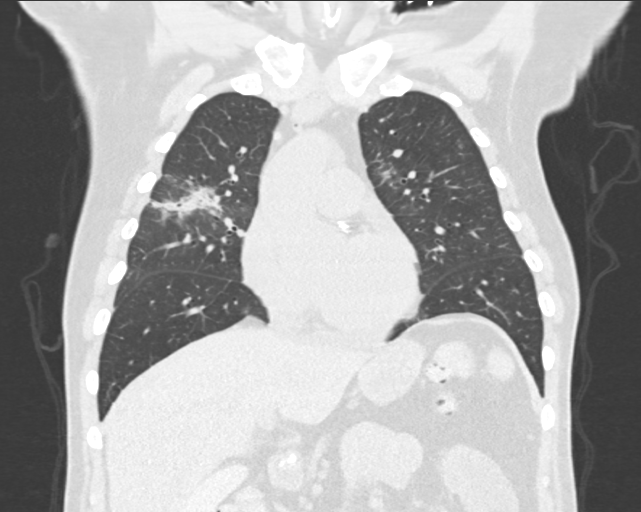
[im 66/110  lung]
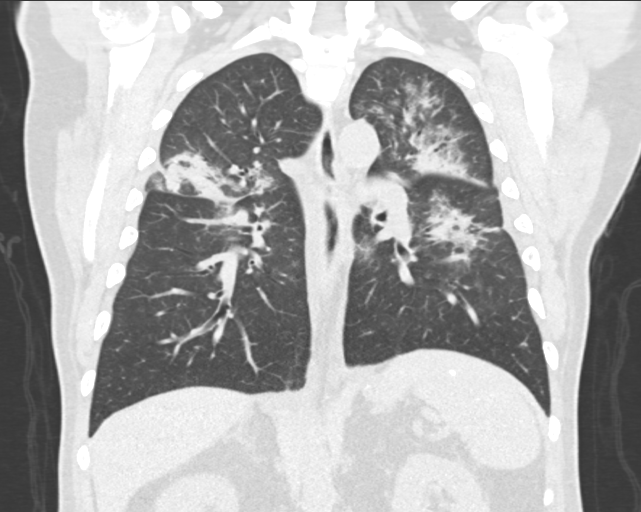

[15 of 36 positions shown; findings below may reference images not displayed]

FINDINGS: Cardiovascular: Coronary artery calcification and aortic
atherosclerotic calcification. No pericardial fluid. Ascending
thoracic aorta within normal limits (36 mm).

Mediastinum/Nodes: No axillary or supraclavicular adenopathy. No
mediastinal hilar adenopathy. Small calcified lymph nodes are in the
mediastinum. Esophagus normal.

Lungs/Pleura: Bilateral airspace disease within the RIGHT upper lobe
and LEFT upper lobe as well as superior segment of the LEFT lower
lobe. Airspace disease reaches confluence in the upper lobes.
Bilateral pleural effusions which are moderate in volume.

Several pulmonary nodules with partial calcification are present.
For example RIGHT middle lobe nodule measuring 7 mm (image 106,
series 8). Similar nodule in the posterior RIGHT lower lobe
measuring 9 mm on image 104, series 3.

Upper Abdomen: Limited view of the liver, kidneys, pancreas are
unremarkable. Normal adrenal glands.

Musculoskeletal: No aggressive osseous lesion.
IMPRESSION: 1. Bilateral multifocal pneumonia within the upper lobes.
2. Moderate bilateral pleural effusions.
3. Evidence of prior granulomatous disease with calcified
mediastinal lymph nodes and partially calcified pulmonary nodules.
These results will be called to the ordering clinician or
representative by the Radiologist Assistant, and communication
documented in the PACS or zVision Dashboard.

## 2018-07-11 ENCOUNTER — Other Ambulatory Visit: Payer: Self-pay

## 2018-07-11 MED ORDER — CLOPIDOGREL BISULFATE 75 MG PO TABS: 75.0000 mg | ORAL_TABLET | Freq: Every day | ORAL | 3 refills | Status: DC

## 2018-09-29 ENCOUNTER — Other Ambulatory Visit: Payer: Self-pay | Admitting: Cardiology

## 2018-12-22 ENCOUNTER — Encounter: Payer: Self-pay | Admitting: *Deleted

## 2018-12-22 ENCOUNTER — Encounter: Payer: Self-pay | Admitting: Cardiology

## 2018-12-22 ENCOUNTER — Ambulatory Visit (INDEPENDENT_AMBULATORY_CARE_PROVIDER_SITE_OTHER): Payer: BLUE CROSS/BLUE SHIELD | Admitting: Cardiology

## 2018-12-22 VITALS — BP 185/92 | HR 62 | Ht 69.0 in | Wt 181.0 lb

## 2018-12-22 DIAGNOSIS — N183 Chronic kidney disease, stage 3 unspecified: Secondary | ICD-10-CM

## 2018-12-22 DIAGNOSIS — I10 Essential (primary) hypertension: Secondary | ICD-10-CM

## 2018-12-22 DIAGNOSIS — I5022 Chronic systolic (congestive) heart failure: Secondary | ICD-10-CM

## 2018-12-22 DIAGNOSIS — I2510 Atherosclerotic heart disease of native coronary artery without angina pectoris: Secondary | ICD-10-CM | POA: Diagnosis not present

## 2018-12-22 DIAGNOSIS — E782 Mixed hyperlipidemia: Secondary | ICD-10-CM

## 2018-12-22 MED ORDER — HYDRALAZINE HCL 25 MG PO TABS: 25.0000 mg | ORAL_TABLET | Freq: Three times a day (TID) | ORAL | 3 refills | Status: DC

## 2018-12-22 MED ORDER — ISOSORBIDE MONONITRATE ER 30 MG PO TB24: 30.0000 mg | ORAL_TABLET | Freq: Every day | ORAL | 3 refills | Status: DC

## 2018-12-22 NOTE — Patient Instructions (Addendum)
Medication Instructions:   Your physician has recommended you make the following change in your medication:   Stop plavix (clopidogrel)  Start imdur (isosorbide mononitrate) 30 mg by mouth daily  Start hydralazine 25 mg by mouth three times daily  Continue all other medications the same  Labwork:  NONE  Testing/Procedures:  NONE  Follow-Up:  Your physician recommends that you schedule a follow-up appointment in: 4 months.    Any Other Special Instructions Will Be Listed Below (If Applicable).  Please call our office on Monday with your blood pressure readings  If you need a refill on your cardiac medications before your next appointment, please call your pharmacy.

## 2018-12-22 NOTE — Progress Notes (Signed)
Clinical Summary [name] is a 63 y.o.male seen today for follow up of the following medical problems.    1. 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 Inferolateral scar with mild per-infarct ischemia   - no recent chest pain. No SOB/DOE - compliant with meds   2. Chronic systolic HF/ICM - 02/2017 LVEF 40-98%30-35% - 08/2017 echo LVEF 35-40%, grade II diastoilc dysfunction - L>R leg swelling for many years.  -Has not been on ACE/ARB/ARNI due to poor renal function, however since last visit neprhology has started lisinopril  - no recent edema. No SOB or DOE    3. CKD 3 - followed by Douglas Cannon  4. HTN - compliant with meds - since last visit neprhology started lisinopril  5. Hyperlipidemia - we increased crestor to 10mg  in 02/2018 - compliant with statin      Past Medical History:  Diagnosis Date  . Anemia   . Anxiety   . CKD (chronic kidney disease) stage 3, GFR 30-59 ml/min (HCC)   . Diastolic heart failure [facility](HCC)    Episode December 2017 in the setting of pneumonia  . Essential hypertension   . History of pneumonia   . Hyperlipemia   . Osteomyelitis (HCC)   . Type 2 diabetes mellitus (HCC)      Allergies  Allergen Reactions  . No Known Allergies      Current Outpatient Medications  Medication Sig Dispense Refill  . amLODipine (NORVASC) 10 MG tablet Take 1 tablet (10 mg total) by mouth daily. 90 tablet 3  . aspirin EC 81 MG tablet Take 81 mg by mouth daily.    . carvedilol (COREG) 25 MG tablet TAKE 1 TABLET BY MOUTH TWICE DAILY 180 tablet 3  .  citalopram (CELEXA) 20 MG tablet Take 20 mg by mouth daily.    . clopidogrel (PLAVIX) 75 MG tablet Take 1 tablet (75 mg total) by mouth daily. 90 tablet 3  . Cyanocobalamin (B-12 PO) Take 1 tablet by mouth daily.    Marland Kitchen. DM-Phenylephrine-Acetaminophen (ALKA-SELTZER PLS SINUS & COUGH PO) Take 1 tablet by mouth daily as needed.    . furosemide (LASIX) 40 MG tablet Take 1 tablet (40 mg total) by mouth daily. 90 tablet 3  . glimepiride (AMARYL) 4 MG tablet Take 4 mg by mouth daily with breakfast.    . metFORMIN (GLUCOPHAGE) 500 MG tablet Take by mouth 2 (two) times daily with a meal.    . rosuvastatin (CRESTOR) 10 MG tablet Take 1 tablet (10 mg total) by mouth at bedtime. 90 tablet 3  . triamcinolone ointment (KENALOG) 0.1 % Apply 1 application topically 2 (two) times daily.    . Zinc 50 MG TABS Take 1 tablet by mouth daily.     No current facility-administered medications for this visit.      Past Surgical History:  Procedure Laterality Date  . AMPUTATION Right 12/24/2015   Procedure: PARTIAL THIRD RAY AMPUTATION OF THE RIGHT FOOT (AMPUTATION OF THE THIRD TOE AND A PORTION OF THE THIRD METATARSAL);  Surgeon: Ferman HammingBenjamin McKinney, DPM;  Location: [facility];  Service: Podiatry;  Laterality:  Right;  Marland Kitchen CORONARY ARTERY BYPASS GRAFT N/A 03/22/2017   Procedure: CORONARY ARTERY BYPASS GRAFTING (CABG)x4 using left internal mammary artery, cryo shapenous vein and left greater saphenous vein harvested endoscopically.  LIMA-LAD SVG-DIAD CRYOVEIN -OM2 CRYOVEIN -OM1 EVH LEFT THIGH, RIGHT GSV EXPLORED -TOO SMALL TO USE;  Surgeon: Kerin Perna, MD;  Location: [facility] OR;  Service: Open Heart Surgery;  Laterality: N/A;  . LEFT HEART CATH AND CORONARY ANGIOGRAPHY N/A 03/15/2017   Procedure: Left Heart Cath and Coronary Angiography;  Surgeon: Lennette Bihari, MD;  Location: [facility] INVASIVE CV LAB;  Service: Cardiovascular;  Laterality: N/A;  . RIGHT HEART CATH N/A 03/18/2017   Procedure: Right Heart Cath;  Surgeon: Laurey Morale, MD;   Location: [facility] INVASIVE CV LAB;  Service: Cardiovascular;  Laterality: N/A;  . TEE WITHOUT CARDIOVERSION N/A 03/22/2017   Procedure: TRANSESOPHAGEAL ECHOCARDIOGRAM (TEE);  Surgeon: Donata Clay, Theron Arista, MD;  Location: [facility] OR;  Service: Open Heart Surgery;  Laterality: N/A;     Allergies  Allergen Reactions  . No Known Allergies       Family History  Adopted: Yes     Social History Douglas Cannon reports that he has never smoked. He has never used smokeless tobacco. Douglas Cannon reports no history of alcohol use.   Review of Systems CONSTITUTIONAL: No weight loss, fever, chills, weakness or fatigue.  HEENT: Eyes: No visual loss, blurred vision, double vision or yellow sclerae.No hearing loss, sneezing, congestion, runny nose or sore throat.  SKIN: No rash or itching.  CARDIOVASCULAR: per hpi RESPIRATORY: No shortness of breath, cough or sputum.  GASTROINTESTINAL: No anorexia, nausea, vomiting or diarrhea. No abdominal pain or blood.  GENITOURINARY: No burning on urination, no polyuria NEUROLOGICAL: No headache, dizziness, syncope, paralysis, ataxia, numbness or tingling in the extremities. No change in bowel or bladder control.  MUSCULOSKELETAL: No muscle, back pain, joint pain or stiffness.  LYMPHATICS: No enlarged nodes. No history of splenectomy.  PSYCHIATRIC: No history of depression or anxiety.  ENDOCRINOLOGIC: No reports of sweating, cold or heat intolerance. No polyuria or polydipsia.  Marland Kitchen   Physical Examination Vitals:   12/22/18 1507 12/22/18 1517  BP: (!) 194/94 (!) 185/92  Pulse: 62 62  SpO2: 100%    Vitals:   12/22/18 1507  Weight: 181 lb (82.1 kg)  Height: 5\' 9"  (1.753 m)    Gen: resting comfortably, no acute distress HEENT: no scleral icterus, pupils equal round and reactive, no palptable cervical adenopathy,  CV: RRR, no m/rg, no jvd Resp: Clear to auscultation bilaterally GI: abdomen is soft, non-tender, non-distended, normal bowel sounds, no  hepatosplenomegaly MSK: extremities are warm, no edema.  Skin: warm, no rash Neuro:  no focal deficits Psych: appropriate affect   Diagnostic Studies 03/2017 cath  LM lesion, 90 %stenosed.  Ost LAD to Prox LAD lesion, 95 %stenosed.  Ost 1st Diag to 1st Diag lesion, 90 %stenosed.  Ost 3rd Mrg to 3rd Mrg lesion, 95 %stenosed.  Mid Cx to Dist Cx lesion, 90 %stenosed.  RPDA lesion, 85 %stenosed.  Mid RCA lesion, 20 %stenosed.  Acute Mrg lesion, 95 %stenosed.  Findings are compatible with a significant ischemic cardiomyopathy in this patient with echo Doppler documentation of an EF of 30-35%.  Severe multivessel CAD with coronary calcification and diffuse 90-95% near ostial to mid LAD stenoses with diffuse 90% stenosis in the first diagonal vessel; 95 and 90% diffuse stenoses in segmental obtuse marginal branches of the left circumflex coronary artery with good distal targets, and diffuse  80% to 90% PDA stenosis with 20% mid RCA stenosis and 95% proximal stenosis and a small proximal Douglas Cannon of the RCA.  LVEDP 34 mm Hg.  RECOMMENDATION: Surgical consultation will be obtained for CABG revascularization surgery.  08/2017 echo Study Conclusions  - Left ventricle: The cavity size was normal. Wall thickness was increased in a pattern of mild LVH. Systolic function was moderately reduced. The estimated ejection fraction was in the range of 35% to 40%. Features are consistent with a pseudonormal left ventricular filling pattern, with concomitant abnormal relaxation and increased filling pressure (grade 2 diastolic dysfunction). Doppler parameters are consistent with high ventricular filling pressure. - Aortic valve: Mildly calcified annulus. Normal thickness leaflets. Valve area (VTI): 3.71 cm^2. Valve area (Vmax): 3.39 cm^2. - Mitral valve: There was mild regurgitation. The MR VC is 0.4 cm. - Left atrium: The atrium was moderately dilated. - Atrial  septum: No defect or patent foramen ovale was identified. - Technically difficult study.   Jan 2019 Exercise nuclear stress  No diagnostic ST segment changes following Lexiscan.  Large, moderate intensity, partially reversible inferolateral defect that is most consistent with scar with mild peri-infarct ischemia.  This is an intermediate risk study.  Nuclear stress EF: 40%.     Assessment and Plan   1. CAD - s/p CABG -no recent symptoms. Has compelted over 1 year of DAPT in setting of NSTEMI with CABG, stop plavix   2. Chronic systolic HF - appears euvolemic - started on ACE-I by renal, defer dosing to them - in setting of HTN and systolic dysfunction start imdur 30mg  and hydralazine 25mg  tid.    3. CKD 3 -followed by nephrology -request labs and clinic notes  4. HTN - above goal - start imdur 30mg  and hydralazine 25mg  tid, he is to call us Mon to update on bp's - needs bp better controlled for cataract surgery and DOT clearance.   5. Hyperlipidemia  continue statin  F/u 4 months   Antoine PocheJonathan F. Filmore Molyneux, M.D.

## 2018-12-28 ENCOUNTER — Telehealth: Payer: Self-pay | Admitting: *Deleted

## 2018-12-28 NOTE — Telephone Encounter (Signed)
Pt dropped off BP log   2/8 - 113/68 2/9 - 113/78 2/10 - 128/69 2/11 - 145/82

## 2018-12-28 NOTE — Telephone Encounter (Signed)
LM to return call.

## 2018-12-28 NOTE — Telephone Encounter (Signed)
BP's are reasonable, not changes at this time   Dominga FerryJ Douglas Fortune MD

## 2019-01-02 NOTE — Telephone Encounter (Signed)
My Chart message sent

## 2019-01-24 ENCOUNTER — Telehealth: Payer: Self-pay | Admitting: *Deleted

## 2019-01-24 MED ORDER — HYDRALAZINE HCL 25 MG PO TABS: 37.5000 mg | ORAL_TABLET | Freq: Three times a day (TID) | ORAL | 3 refills | Status: DC

## 2019-01-24 NOTE — Telephone Encounter (Signed)
Pt dropped of letter saying SBP has been 145-160 DBP 70s - called pt and he denies any symptoms says he feels great - says HR remains in the 60s - BP has been increasing over the last 3 weeks

## 2019-01-24 NOTE — Telephone Encounter (Signed)
Pt voiced understanding - dose change sent to [facility] as requested

## 2019-01-24 NOTE — Telephone Encounter (Signed)
Increase hydralazine to 37.5mg  tid  J Birdena Kingma MD

## 2019-04-18 ENCOUNTER — Telehealth: Payer: Self-pay | Admitting: Cardiology

## 2019-04-18 NOTE — Telephone Encounter (Signed)
Virtual Visit Pre-Appointment Phone Call  "(Name), I am calling you today to discuss your upcoming appointment. We are currently trying to limit exposure to the virus that causes COVID-19 by seeing patients at home rather than in the office."  1. "What is the BEST phone number to call the day of the visit?" - include this in appointment notes  2. Do you have or have access to (through a family member/friend) a smartphone with video capability that we can use for your visit?" a. If yes - list this number in appt notes as cell (if different from BEST phone #) and list the appointment type as a VIDEO visit in appointment notes b. If no - list the appointment type as a PHONE visit in appointment notes  3. Confirm consent - "In the setting of the current Covid19 crisis, you are scheduled for a (phone or video) visit with your provider on (date) at (time).  Just as we do with many in-office visits, in order for you to participate in this visit, we must obtain consent.  If you'd like, I can send this to your mychart (if signed up) or email for you to review.  Otherwise, I can obtain your verbal consent now.  All virtual visits are billed to your insurance company just like a normal visit would be.  By agreeing to a virtual visit, we'd like you to understand that the technology does not allow for your provider to perform an examination, and thus may limit your provider's ability to fully assess your condition. If your provider identifies any concerns that need to be evaluated in person, we will make arrangements to do so.  Finally, though the technology is pretty good, we cannot assure that it will always work on either your or our end, and in the setting of a video visit, we may have to convert it to a phone-only visit.  In either situation, we cannot ensure that we have a secure connection.  Are you willing to proceed?" STAFF: Did the patient verbally acknowledge consent to telehealth visit? Document  YES/NO here: Yes  4. Advise patient to be prepared - "Two hours prior to your appointment, go ahead and check your blood pressure, pulse, oxygen saturation, and your weight (if you have the equipment to check those) and write them all down. When your visit starts, your provider will ask you for this information. If you have an Apple Watch or Kardia device, please plan to have heart rate information Cannon on the day of your appointment. Please have a pen and paper handy nearby the day of the visit as well."  5. Give patient instructions for MyChart download to smartphone OR Doximity/Doxy.me as below if video visit (depending on what platform provider is using)  6. Inform patient they will receive a phone call 15 minutes prior to their appointment time (may be from unknown caller ID) so they should be prepared to answer    TELEPHONE CALL NOTE  Douglas ReadyDavid W Cannon has been deemed a candidate for a follow-up tele-health visit to limit community exposure during the Covid-19 pandemic. I spoke with the patient via phone to ensure availability of phone/video source, confirm preferred email & phone number, and discuss instructions and expectations.  I reminded Douglas ReadyDavid W Sprigg to be prepared with any vital sign and/or heart rhythm information that could potentially be obtained via home monitoring, at the time of his visit. I reminded Douglas ReadyDavid W Nobbe to expect a phone call prior to  his visit.  Geraldine Contras 04/18/2019 4:25 PM   INSTRUCTIONS FOR DOWNLOADING THE MYCHART APP TO SMARTPHONE  - The patient must first make sure to have activated MyChart and know their login information - If Apple, go to [employer] and type in MyChart in the search bar and download the app. If Android, ask patient to go to [employer] and type in [place] in the search bar and download the app. The app is free but as with any other app downloads, their phone may require them to verify saved payment information or Apple/Android  password.  - The patient will need to then log into the app with their MyChart username and password, and select [facility] as their healthcare provider to link the account. When it is time for your visit, go to the MyChart app, find appointments, and click Begin Video Visit. Be sure to Select Allow for your device to access the Microphone and Camera for your visit. You will then be connected, and your provider will be with you shortly.  **If they have any issues connecting, or need assistance please contact MyChart service desk [phone])**  **If using a computer, in order to ensure the best quality for their visit they will need to use either of the following Internet Browsers: [employer] or Google Chrome**  IF USING DOXIMITY or DOXY.ME - The patient will receive a link just prior to their visit by text.     FULL LENGTH CONSENT FOR TELE-HEALTH VISIT   I hereby voluntarily request, consent and authorize [facility] and its employed or contracted physicians, physician assistants, nurse practitioners or other licensed health care professionals (the Practitioner), to provide me with telemedicine health care services (the Services") as deemed necessary by the treating Practitioner. I acknowledge and consent to receive the Services by the Practitioner via telemedicine. I understand that the telemedicine visit will involve communicating with the Practitioner through live audiovisual communication technology and the disclosure of certain medical information by electronic transmission. I acknowledge that I have been given the opportunity to request an in-person assessment or other available alternative prior to the telemedicine visit and am voluntarily participating in the telemedicine visit.  I understand that I have the right to withhold or withdraw my consent to the use of telemedicine in the course of my care at any time, without affecting my right to future care or treatment,  and that the Practitioner or I may terminate the telemedicine visit at any time. I understand that I have the right to inspect all information obtained and/or recorded in the course of the telemedicine visit and may receive copies of available information for a reasonable fee.  I understand that some of the potential risks of receiving the Services via telemedicine include:   Delay or interruption in medical evaluation due to technological equipment failure or disruption;  Information transmitted may not be sufficient (e.g. poor resolution of images) to allow for appropriate medical decision making by the Practitioner; and/or   In rare instances, security protocols could fail, causing a breach of personal health information.  Furthermore, I acknowledge that it is my responsibility to provide information about my medical history, conditions and care that is complete and accurate to the best of my ability. I acknowledge that Practitioner's advice, recommendations, and/or decision may be based on factors not within their control, such as incomplete or inaccurate data provided by me or distortions of diagnostic images or specimens that may result from electronic transmissions. I  understand that the practice of medicine is not an exact science and that Practitioner makes no warranties or guarantees regarding treatment outcomes. I acknowledge that I will receive a copy of this consent concurrently upon execution via email to the email address I last provided but may also request a printed copy by calling the office of [place].    I understand that my insurance will be billed for this visit.   I have read or had this consent read to me.  I understand the contents of this consent, which adequately explains the benefits and risks of the Services being provided via telemedicine.   I have been provided ample opportunity to ask questions regarding this consent and the Services and have had my questions  answered to my satisfaction.  I give my informed consent for the services to be provided through the use of telemedicine in my medical care  By participating in this telemedicine visit I agree to the above.

## 2019-04-23 ENCOUNTER — Encounter: Payer: Self-pay | Admitting: Cardiology

## 2019-04-23 ENCOUNTER — Encounter: Payer: Self-pay | Admitting: *Deleted

## 2019-04-23 ENCOUNTER — Telehealth (INDEPENDENT_AMBULATORY_CARE_PROVIDER_SITE_OTHER): Payer: BC Managed Care – PPO | Admitting: Cardiology

## 2019-04-23 ENCOUNTER — Telehealth: Payer: Self-pay | Admitting: *Deleted

## 2019-04-23 VITALS — BP 108/58 | HR 61 | Ht 69.0 in | Wt 179.0 lb

## 2019-04-23 DIAGNOSIS — E782 Mixed hyperlipidemia: Secondary | ICD-10-CM

## 2019-04-23 DIAGNOSIS — I2510 Atherosclerotic heart disease of native coronary artery without angina pectoris: Secondary | ICD-10-CM

## 2019-04-23 DIAGNOSIS — I10 Essential (primary) hypertension: Secondary | ICD-10-CM

## 2019-04-23 DIAGNOSIS — I5022 Chronic systolic (congestive) heart failure: Secondary | ICD-10-CM

## 2019-04-23 DIAGNOSIS — N183 Chronic kidney disease, stage 3 unspecified: Secondary | ICD-10-CM

## 2019-04-23 NOTE — Telephone Encounter (Signed)
[facility] never filled crestor. Patient said that he might have filled this at [facility]. Will contact [place].

## 2019-04-23 NOTE — Progress Notes (Signed)
Virtual Visit via Video Note   This visit type was conducted due to national recommendations for restrictions regarding the COVID-19 Pandemic (e.g. social distancing) in an effort to limit this patient's exposure and mitigate transmission in our community.  Due to his co-morbid illnesses, this patient is at least at moderate risk for complications without adequate follow up.  This format is felt to be most appropriate for this patient at this time.  All issues noted in this document were discussed and addressed.  A limited physical exam was performed with this format.  Please refer to the patient's chart for his consent to telehealth for [facility].   Date:  04/23/2019   ID:  Douglas ReadyDavid W Cannon, DOB 02/21/1956, MRN [id number]  Patient Location: Home Provider Location: Office  PCP:  Benita StabileHall, John Z, MD  Cardiologist:  Dina RichBranch, Rhodie Cienfuegos, MD  Electrophysiologist:  None   Evaluation Performed:  Follow-Up Visit  Chief Complaint:  3 month follow up  History of Present Illness:    Douglas Cannon is a 63 y.o. male seen today for follow up of the following medical problems.    1. 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 Inferolateral scar with mild per-infarct ischemia    - no recent chest pain. No SOB or DOE   2. Chronic systolic HF/ICM - 02/2017 LVEF 40-98%30-35% - 08/2017 echo LVEF 35-40%, grade II diastoilc dysfunction - L>R leg swelling for many years.  -Has not been on ACE/ARB/ARNI due to poor renal function, however since last visit neprhology has started lisinopril   -No SOB or DOE, no LE edema -  compliant with meds    3. CKD 3 - followed by Dr Fausto SkillernBefakadu  4. HTN - compliant with meds, ACE-I per nephrology    5. Hyperlipidemia - unclear what happened to his crestor, he thnks another provider may have stopped it but it not sure.    The patient does not have symptoms concerning for COVID-19 infection (fever, chills, cough, or new shortness of breath).    Past Medical History:  Diagnosis Date  . Anemia   . Anxiety   . CKD (chronic kidney disease) stage 3, GFR 30-59 ml/min (HCC)   . Diastolic heart failure [facility](HCC)    Episode December 2017 in the setting of pneumonia  . Essential hypertension   . History of pneumonia   . Hyperlipemia   . Osteomyelitis (HCC)   . Type 2 diabetes mellitus (HCC)    Past Surgical History:  Procedure Laterality Date  . AMPUTATION Right 12/24/2015   Procedure: PARTIAL THIRD RAY AMPUTATION OF THE RIGHT FOOT (AMPUTATION OF THE THIRD TOE AND A PORTION OF THE THIRD METATARSAL);  Surgeon: Ferman HammingBenjamin McKinney, DPM;  Location: [facility];  Service: Podiatry;  Laterality: Right;  . CORONARY ARTERY BYPASS GRAFT N/A 03/22/2017   Procedure: CORONARY ARTERY BYPASS GRAFTING (CABG)x4 using left internal mammary artery, cryo shapenous vein and left greater saphenous vein harvested endoscopically.  LIMA-LAD SVG-DIAD CRYOVEIN -OM2 CRYOVEIN -OM1 EVH LEFT THIGH, RIGHT GSV EXPLORED -TOO SMALL TO USE;  Surgeon: Kerin PernaVan Trigt, Peter, MD;  Location: [facility] OR;  Service: Open Heart  Surgery;  Laterality: N/A;  . LEFT HEART CATH AND CORONARY ANGIOGRAPHY N/A 03/15/2017   Procedure: Left Heart Cath and Coronary Angiography;  Surgeon: Troy Sine, MD;  Location: [place] CV [facility];  Service: Cardiovascular;  Laterality: N/A;  . RIGHT HEART CATH N/A 03/18/2017   Procedure: Right Heart Cath;  Surgeon: Larey Dresser, MD;  Location: [place] CV LAB;  Service: Cardiovascular;  Laterality: N/A;  . TEE WITHOUT CARDIOVERSION N/A 03/22/2017   Procedure: TRANSESOPHAGEAL ECHOCARDIOGRAM (TEE);   Surgeon: Prescott Gum, Collier Salina, MD;  Location: [place];  Service: Open Heart Surgery;  Laterality: N/A;     Current Meds  Medication Sig  . aspirin EC 81 MG tablet Take 81 mg by mouth daily.  . carvedilol (COREG) 25 MG tablet TAKE 1 TABLET BY MOUTH TWICE DAILY  . Cholecalciferol (VITAMIN D3) 50 MCG (2000 UT) TABS Take 1 tablet by mouth daily.  . citalopram (CELEXA) 20 MG tablet Take 20 mg by mouth daily.  . Cyanocobalamin (B-12 PO) Take 1 tablet by mouth daily.  . furosemide (LASIX) 40 MG tablet Take 40 mg by mouth 2 (two) times daily.  Marland Kitchen glipiZIDE (GLUCOTROL XL) 10 MG 24 hr tablet Take 10 mg by mouth 2 (two) times daily.   . hydrALAZINE (APRESOLINE) 25 MG tablet Take 25 mg by mouth 3 (three) times daily.  . isosorbide mononitrate (IMDUR) 30 MG 24 hr tablet Take 1 tablet (30 mg total) by mouth daily.  Marland Kitchen lisinopril (PRINIVIL,ZESTRIL) 40 MG tablet Take 40 mg by mouth daily.   Marland Kitchen triamcinolone ointment (KENALOG) 0.1 % Apply 1 application topically 2 (two) times daily as needed.   . zinc gluconate 50 MG tablet Take 50 mg by mouth daily as needed.  . [DISCONTINUED] hydrALAZINE (APRESOLINE) 25 MG tablet Take 1.5 tablets (37.5 mg total) by mouth 3 (three) times daily. (Patient taking differently: Take 25 mg by mouth 3 (three) times daily. )     Allergies:   No known allergies   Social History   Tobacco Use  . Smoking status: Never Smoker  . Smokeless tobacco: Never Used  Substance Use Topics  . Alcohol use: No  . Drug use: No     Family Hx: The patient's family history is not on file. He was adopted.  ROS:   Please see the history of present illness.     All other systems reviewed and are negative.   Prior CV studies:   The following studies were reviewed today:  03/2017 cath  LM lesion, 90 %stenosed.  Ost LAD to Prox LAD lesion, 95 %stenosed.  Ost 1st Diag to 1st Diag lesion, 90 %stenosed.  Ost 3rd Mrg to 3rd Mrg lesion, 95 %stenosed.  Mid Cx to Dist Cx lesion, 90 %stenosed.   RPDA lesion, 85 %stenosed.  Mid RCA lesion, 20 %stenosed.  Acute Mrg lesion, 95 %stenosed.  Findings are compatible with a significant ischemic cardiomyopathy in this patient with echo Doppler documentation of an EF of 30-35%.  Severe multivessel CAD with coronary calcification and diffuse 90-95% near ostial to mid LAD stenoses with diffuse 90% stenosis in the first diagonal vessel; 95 and 90% diffuse stenoses in segmental obtuse marginal branches of the left circumflex coronary artery with good distal targets, and diffuse 80% to 90% PDA stenosis with 20% mid RCA stenosis and 95% proximal stenosis and a small proximal Daden Mahany of the RCA.  LVEDP 34 mm Hg.  RECOMMENDATION: Surgical consultation will be obtained for CABG revascularization surgery.  08/2017 echo  Study Conclusions  - Left ventricle: The cavity size was normal. Wall thickness was increased in a pattern of mild LVH. Systolic function was moderately reduced. The estimated ejection fraction was in the range of 35% to 40%. Features are consistent with a pseudonormal left ventricular filling pattern, with concomitant abnormal relaxation and increased filling pressure (grade 2 diastolic dysfunction). Doppler parameters are consistent with high ventricular filling pressure. - Aortic valve: Mildly calcified annulus. Normal thickness leaflets. Valve area (VTI): 3.71 cm^2. Valve area (Vmax): 3.39 cm^2. - Mitral valve: There was mild regurgitation. The MR VC is 0.4 cm. - Left atrium: The atrium was moderately dilated. - Atrial septum: No defect or patent foramen ovale was identified. - Technically difficult study.   Jan 2019 Exercise nuclear stress  No diagnostic ST segment changes following Lexiscan.  Large, moderate intensity, partially reversible inferolateral defect that is most consistent with scar with mild peri-infarct ischemia.  This is an intermediate risk study.  Nuclear stress EF: 40%.   Labs/Other Tests and Data Reviewed:    EKG:  na  Recent Labs: No results found for requested labs within last 8760 hours.   Recent Lipid Panel Lab Results  Component Value Date/Time   CHOL 163 02/28/2018 08:54 AM   TRIG 112 02/28/2018 08:54 AM   HDL 61 02/28/2018 08:54 AM   CHOLHDL 2.7 02/28/2018 08:54 AM   LDLCALC 80 02/28/2018 08:54 AM    Wt Readings from Last 3 Encounters:  04/23/19 179 lb (81.2 kg)  12/22/18 181 lb (82.1 kg)  02/28/18 184 lb (83.5 kg)     Objective:    Vital Signs:  BP (!) 108/58   Pulse 61   Ht 5\' 9"  (1.753 m)   Wt 179 lb (81.2 kg)   BMI 26.43 kg/m    Well nourished male sitting comfortably, no apparent distress. Normal affect, normal speech pattern and tone. No audible or visual signs of SOB or wheezing.   ASSESSMENT & PLAN:    1. CAD - s/p CABG -no recent symptoms, continue current meds   2. Chronic systolic HF - doing well without symptoms, continue current meds   3. CKD 3 -followed by nephrology -ACE-I management per nephorlogy   4. HTN -at goal, continue current meds   5. Hyperlipidemia - unclear why he is not taking his crestor any more, will check with his pharmacy, obtain notes from his other providers.   6. CKD III: most recent labs show Cr improving, continue to monitor  COVID-19 Education: The signs and symptoms of COVID-19 were discussed with the patient and how to seek care for testing (follow up with PCP or arrange E-visit).  The importance of social distancing was discussed today.  Time:   Today, I have spent 18 minutes with the patient with telehealth technology discussing the above problems.     Medication Adjustments/Labs and Tests Ordered: Current medicines are reviewed at length with the patient today.  Concerns regarding medicines are outlined above.   Tests Ordered: No orders of the defined types were placed in this encounter.   Medication Changes: No orders of the defined types were placed  in this encounter.   Disposition:  Follow up 3 months  Signed, Dina RichBranch, Arali Somera, MD  04/23/2019 2:16 PM    [facility]

## 2019-04-23 NOTE — Patient Instructions (Addendum)
Medication Instructions:   Your physician recommends that you continue on your current medications as directed. Please refer to the Current Medication list given to you today.  Labwork:  NONE  Testing/Procedures:  NONE  Follow-Up:  Your physician recommends that you schedule a follow-up appointment in: 3 months.  Any Other Special Instructions Will Be Listed Below (If Applicable).  If you need a refill on your cardiac medications before your next appointment, please call your pharmacy. 

## 2019-04-24 NOTE — Telephone Encounter (Signed)
[facility] contacted to see the last fill date for crestor and if they received an order to stop it. If so, by whom. Per Caryl Pina at [facility], crestor last filled on 03/22/2018 and they did not receive any orders to stop it.

## 2019-04-24 NOTE — Telephone Encounter (Signed)
Can we restart crestor 10mg  daily please   Zandra Abts MD

## 2019-04-24 NOTE — Telephone Encounter (Signed)
Returned call

## 2019-04-25 MED ORDER — ROSUVASTATIN CALCIUM 10 MG PO TABS: 10.0000 mg | ORAL_TABLET | Freq: Every day | ORAL | 3 refills | Status: AC

## 2019-04-25 NOTE — Telephone Encounter (Signed)
Patient informed. 

## 2019-07-25 ENCOUNTER — Ambulatory Visit: Payer: BC Managed Care – PPO | Admitting: Cardiology

## 2019-10-24 ENCOUNTER — Other Ambulatory Visit: Payer: Self-pay | Admitting: Cardiology

## 2020-01-16 ENCOUNTER — Other Ambulatory Visit: Payer: Self-pay | Admitting: Cardiology
